# Patient Record
Sex: Male | Born: 1955 | Race: Black or African American | Hispanic: No | Marital: Single | Smoking: Former smoker
Health system: Southern US, Community
[De-identification: ages and names within clinical notes are randomized; demographics above are authoritative.]

## PROBLEM LIST (undated history)

## (undated) DIAGNOSIS — E119 Type 2 diabetes mellitus without complications: Secondary | ICD-10-CM

## (undated) DIAGNOSIS — N179 Acute kidney failure, unspecified: Secondary | ICD-10-CM

## (undated) DIAGNOSIS — E7800 Pure hypercholesterolemia, unspecified: Secondary | ICD-10-CM

## (undated) DIAGNOSIS — E1111 Type 2 diabetes mellitus with ketoacidosis with coma: Secondary | ICD-10-CM

## (undated) DIAGNOSIS — G9340 Encephalopathy, unspecified: Secondary | ICD-10-CM

## (undated) DIAGNOSIS — I10 Essential (primary) hypertension: Secondary | ICD-10-CM

## (undated) DIAGNOSIS — Z8711 Personal history of peptic ulcer disease: Secondary | ICD-10-CM

## (undated) DIAGNOSIS — U071 COVID-19: Secondary | ICD-10-CM

## (undated) HISTORY — PX: TONSILLECTOMY: SUR1361

## (undated) HISTORY — PX: APPENDECTOMY: SHX54

## (undated) HISTORY — PX: OTHER SURGICAL HISTORY: SHX169

---

## 2013-08-26 ENCOUNTER — Encounter (HOSPITAL_COMMUNITY): Payer: Self-pay | Admitting: Emergency Medicine

## 2013-08-26 ENCOUNTER — Emergency Department (HOSPITAL_COMMUNITY): Payer: Non-veteran care

## 2013-08-26 ENCOUNTER — Emergency Department (HOSPITAL_COMMUNITY)
Admission: EM | Admit: 2013-08-26 | Discharge: 2013-08-26 | Disposition: A | Discharge status: Home / Self Care | Payer: Non-veteran care | Attending: Emergency Medicine | Admitting: Emergency Medicine

## 2013-08-26 DIAGNOSIS — I10 Essential (primary) hypertension: Secondary | ICD-10-CM | POA: Insufficient documentation

## 2013-08-26 DIAGNOSIS — E119 Type 2 diabetes mellitus without complications: Secondary | ICD-10-CM | POA: Insufficient documentation

## 2013-08-26 DIAGNOSIS — R0789 Other chest pain: Secondary | ICD-10-CM | POA: Insufficient documentation

## 2013-08-26 HISTORY — DX: Essential (primary) hypertension: I10

## 2013-08-26 HISTORY — DX: Type 2 diabetes mellitus without complications: E11.9

## 2013-08-26 LAB — CBC WITH DIFFERENTIAL/PLATELET
BASOS PCT: 0 % (ref 0–1)
Basophils Absolute: 0 10*3/uL (ref 0.0–0.1)
EOS ABS: 0.1 10*3/uL (ref 0.0–0.7)
EOS PCT: 1 % (ref 0–5)
HCT: 38.7 % — ABNORMAL LOW (ref 39.0–52.0)
Hemoglobin: 12.8 g/dL — ABNORMAL LOW (ref 13.0–17.0)
LYMPHS ABS: 1.6 10*3/uL (ref 0.7–4.0)
Lymphocytes Relative: 30 % (ref 12–46)
MCH: 26.6 pg (ref 26.0–34.0)
MCHC: 33.1 g/dL (ref 30.0–36.0)
MCV: 80.5 fL (ref 78.0–100.0)
Monocytes Absolute: 0.2 10*3/uL (ref 0.1–1.0)
Monocytes Relative: 5 % (ref 3–12)
NEUTROS PCT: 64 % (ref 43–77)
Neutro Abs: 3.3 10*3/uL (ref 1.7–7.7)
PLATELETS: 206 10*3/uL (ref 150–400)
RBC: 4.81 MIL/uL (ref 4.22–5.81)
RDW: 14.3 % (ref 11.5–15.5)
WBC: 5.2 10*3/uL (ref 4.0–10.5)

## 2013-08-26 LAB — BASIC METABOLIC PANEL
BUN: 13 mg/dL (ref 6–23)
CO2: 20 mEq/L (ref 19–32)
Calcium: 9.4 mg/dL (ref 8.4–10.5)
Chloride: 102 mEq/L (ref 96–112)
Creatinine, Ser: 1.06 mg/dL (ref 0.50–1.35)
GFR, EST AFRICAN AMERICAN: 88 mL/min — AB (ref >90)
GFR, EST NON AFRICAN AMERICAN: 76 mL/min — AB (ref >90)
Glucose, Bld: 166 mg/dL — ABNORMAL HIGH (ref 70–99)
Potassium: 4.1 mEq/L (ref 3.7–5.3)
SODIUM: 136 meq/L — AB (ref 137–147)

## 2013-08-26 LAB — I-STAT TROPONIN, ED: TROPONIN I, POC: 0 ng/mL (ref 0.00–0.08)

## 2013-08-26 LAB — TROPONIN I: Troponin I: <0.3 ng/mL (ref <0.30)

## 2013-08-26 IMAGING — CR DG CHEST 2V
2 series · 2 of 2 positions shown · non-contrast
Comparison: None.

CLINICAL DATA: Left chest pain

EXAM:
CHEST  2 VIEW

[w chest pa]
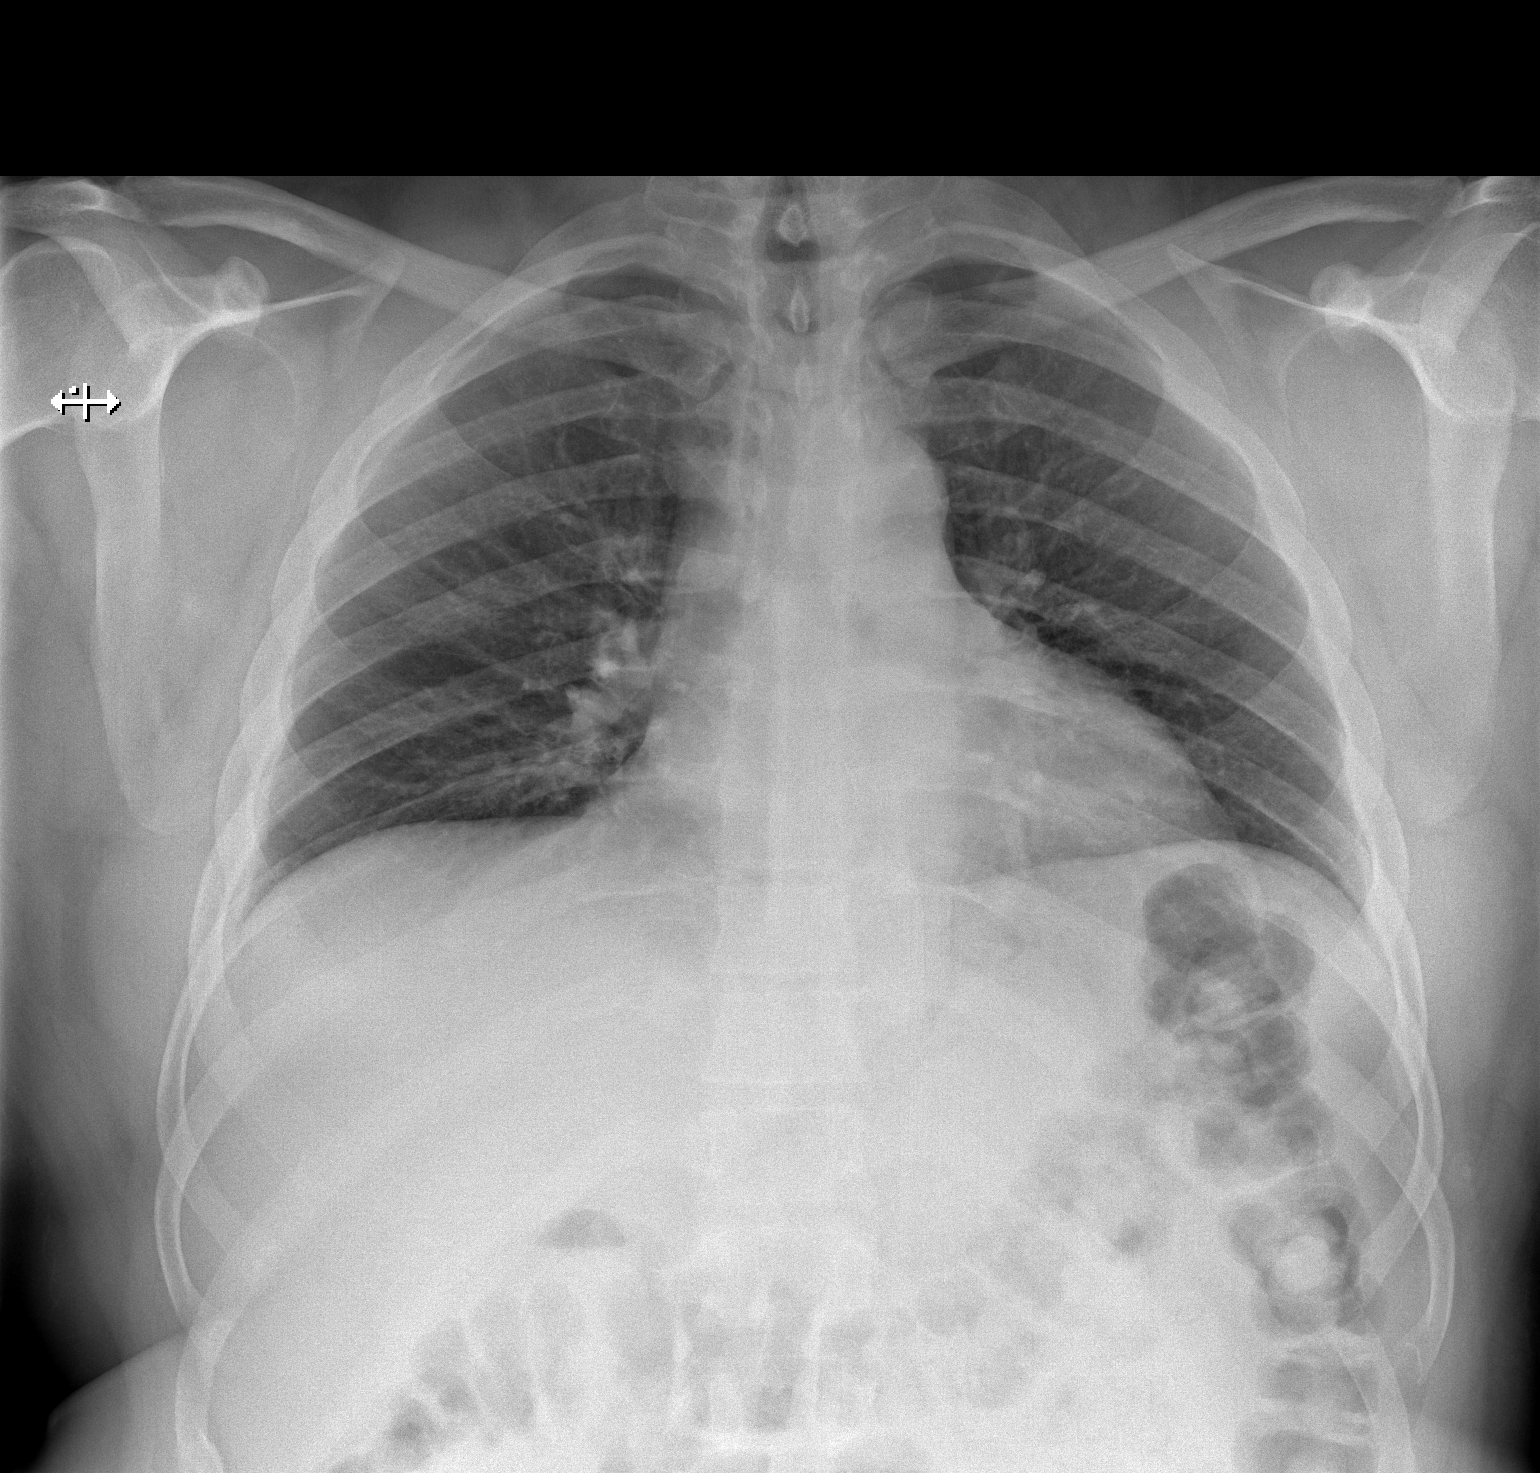

[w chest lat]
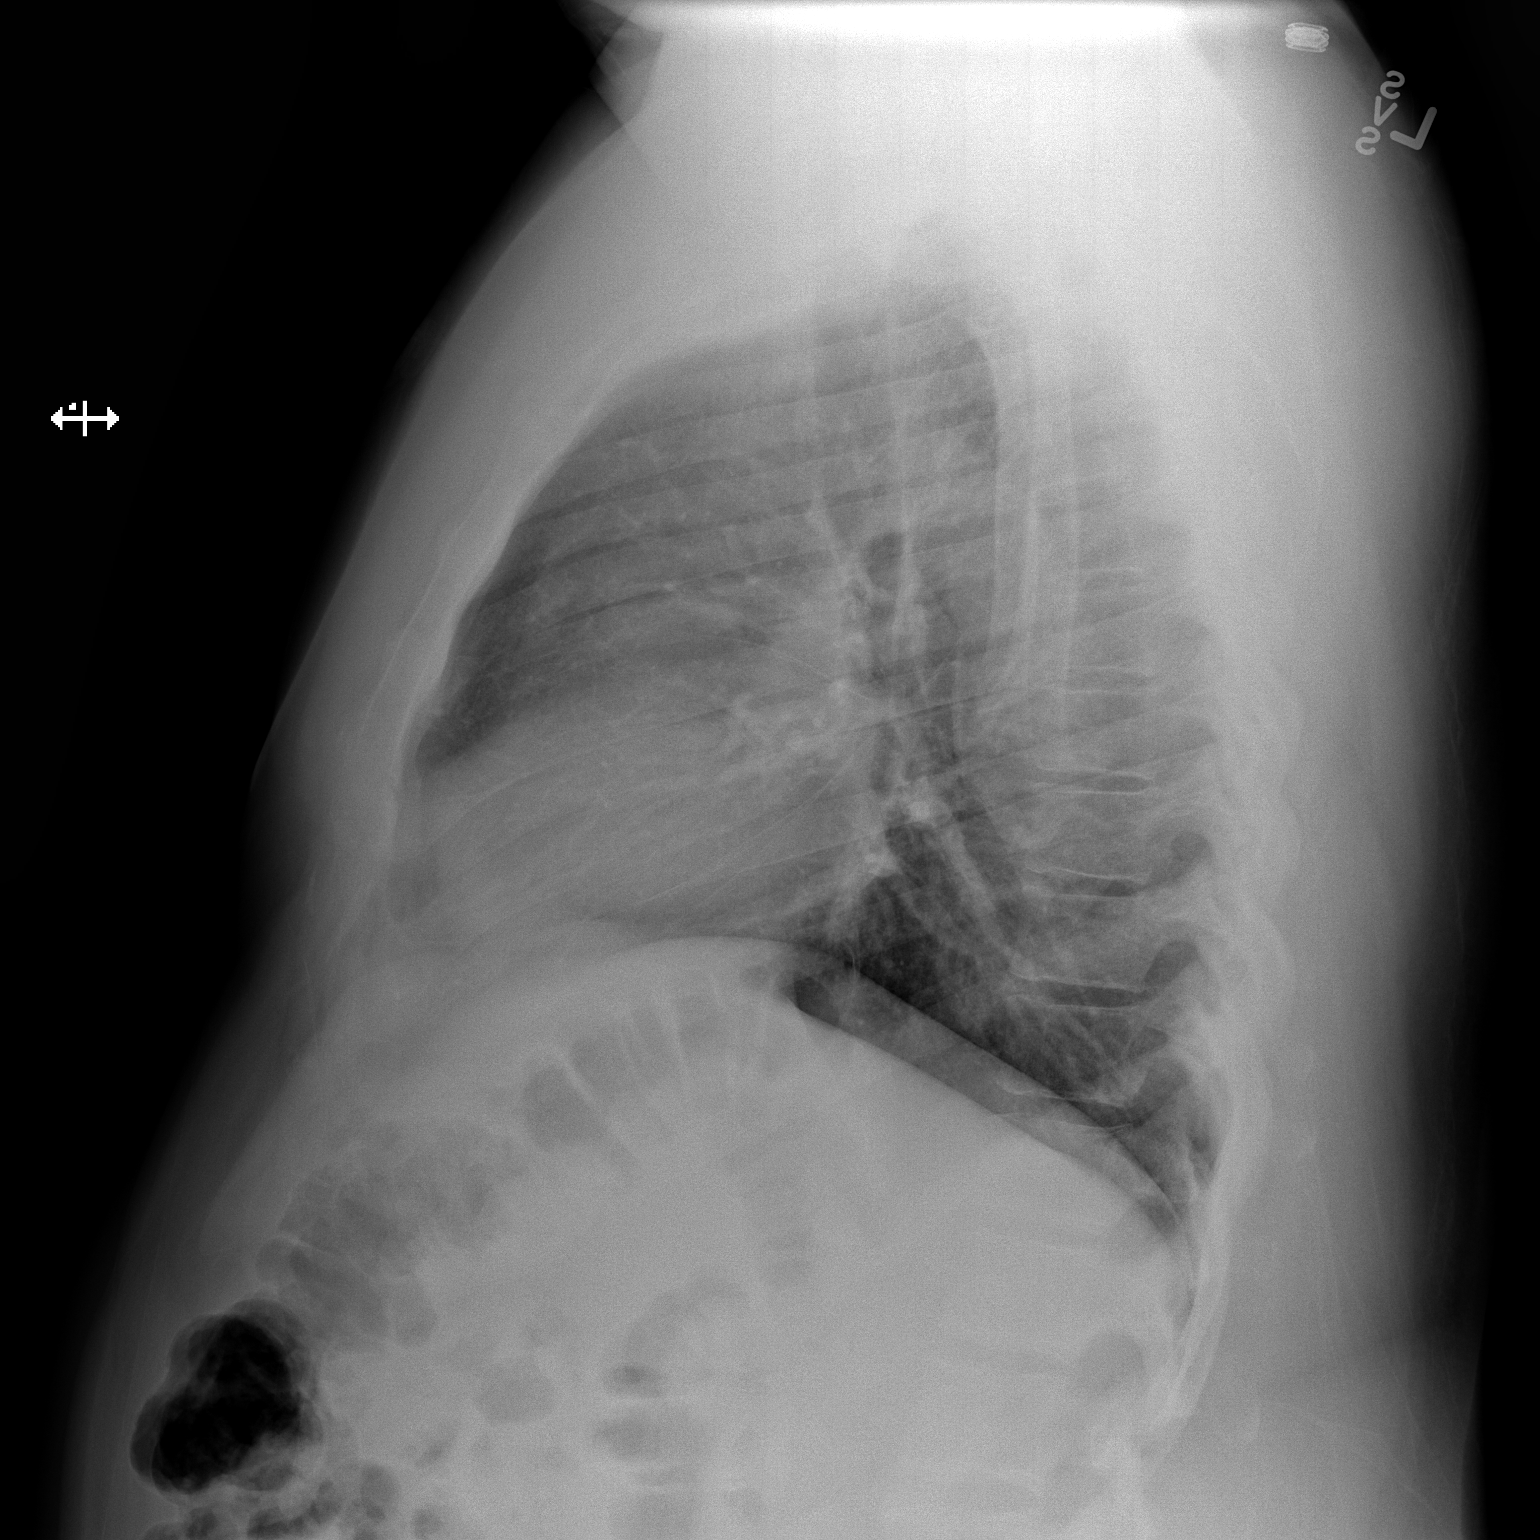

[2 of 2 positions shown; findings below may reference images not displayed]

FINDINGS: The cardiomediastinal silhouette is unremarkable.

There is no evidence of focal airspace disease, pulmonary edema,
suspicious pulmonary nodule/mass, pleural effusion, or pneumothorax.
No acute bony abnormalities are identified.
IMPRESSION: No active cardiopulmonary disease.

## 2013-08-26 MED ORDER — OXYCODONE-ACETAMINOPHEN 5-325 MG PO TABS
1.0000 | ORAL_TABLET | Freq: Once | ORAL | Status: AC
  Administered 2013-08-26: 1 via ORAL
  Filled 2013-08-26: qty 1

## 2013-08-26 MED ORDER — OXYCODONE-ACETAMINOPHEN 5-325 MG PO TABS: 1.0000 | ORAL_TABLET | Freq: Four times a day (QID) | ORAL | Status: DC | PRN

## 2013-08-26 NOTE — ED Notes (Signed)
Pt c/o left sided chest pain; feels like muscle pulling x 3 wks; states feels dizzy when pain starts

## 2013-08-26 NOTE — ED Provider Notes (Signed)
CSN: [id number]     Arrival date & time 08/26/13  1535 History   First MD Initiated Contact with Patient 08/26/13 1953     Chief Complaint  Patient presents with  . Chest Pain     (Consider location/radiation/quality/duration/timing/severity/associated sxs/prior Treatment) Patient is a 58 y.o. male presenting with chest pain. The history is provided by the patient.  Chest Pain Pain location:  L chest Pain quality comment:  Spasm like Pain radiates to:  Does not radiate Pain radiates to the back: no   Pain severity:  Moderate Onset quality:  Gradual Duration:  3 weeks Timing:  Constant Progression:  Unchanged Chronicity:  New Context: at rest   Context: not breathing and not lifting   Relieved by: Mild relief with topical cream. Worsened by:  Nothing tried Ineffective treatments:  None tried Associated symptoms: no abdominal pain, no cough, no fever, no headache, no nausea, no numbness, no shortness of breath and not vomiting     Past Medical History  Diagnosis Date  . Hypertension   . Diabetes mellitus without complication    Past Surgical History  Procedure Laterality Date  . Appendectomy     No family history on file. History  Substance Use Topics  . Smoking status: Never Smoker   . Smokeless tobacco: Not on file  . Alcohol Use: No    Review of Systems  Constitutional: Negative for fever.  HENT: Negative for drooling and rhinorrhea.   Eyes: Negative for pain.  Respiratory: Negative for cough and shortness of breath.   Cardiovascular: Positive for chest pain. Negative for leg swelling.  Gastrointestinal: Negative for nausea, vomiting, abdominal pain and diarrhea.  Genitourinary: Negative for dysuria and hematuria.  Musculoskeletal: Negative for gait problem and neck pain.  Skin: Negative for color change.  Neurological: Negative for numbness and headaches.  Hematological: Negative for adenopathy.  Psychiatric/Behavioral: Negative for behavioral problems.   All other systems reviewed and are negative.      Allergies  Review of patient's allergies indicates no known allergies.  Home Medications   Current Outpatient Rx  Name  Route  Sig  Dispense  Refill  . magnesium chloride (SLOW-MAG) 64 MG TBEC SR tablet   Oral   Take 1 tablet by mouth daily.          BP 153/88  Pulse 87  Temp(Src) 98.6 F (37 C) (Oral)  Resp 17  SpO2 97% Physical Exam  Nursing note and vitals reviewed. Constitutional: He is oriented to person, place, and time. He appears well-developed and well-nourished.  HENT:  Head: Normocephalic and atraumatic.  Right Ear: External ear normal.  Left Ear: External ear normal.  Nose: Nose normal.  Mouth/Throat: Oropharynx is clear and moist. No oropharyngeal exudate.  Eyes: Conjunctivae and EOM are normal. Pupils are equal, round, and reactive to light.  Neck: Normal range of motion. Neck supple.  Cardiovascular: Normal rate, regular rhythm, normal heart sounds and intact distal pulses.  Exam reveals no gallop and no friction rub.   No murmur heard. Pulmonary/Chest: Effort normal and breath sounds normal. No respiratory distress. He has no wheezes. He exhibits tenderness (Mild tenderness to palpation of the left lateral breast.).  Abdominal: Soft. Bowel sounds are normal. He exhibits no distension. There is no tenderness. There is no rebound and no guarding.  Musculoskeletal: Normal range of motion. He exhibits no edema and no tenderness.  Neurological: He is alert and oriented to person, place, and time.  Skin: Skin is warm and dry.  Psychiatric: He has a normal mood and affect. His behavior is normal.    ED Course  Procedures (including critical care time) Labs Review Labs Reviewed  CBC WITH DIFFERENTIAL - Abnormal; Notable for the following:    Hemoglobin 12.8 (*)    HCT 38.7 (*)    All other components within normal limits  BASIC METABOLIC PANEL - Abnormal; Notable for the following:    Sodium 136 (*)     Glucose, Bld 166 (*)    GFR calc non Af Amer 76 (*)    GFR calc Af Amer 88 (*)    All other components within normal limits  I-STAT TROPOININ, ED   Imaging Review Dg Chest 2 View  08/26/2013   CLINICAL DATA:  Left chest pain  EXAM: CHEST  2 VIEW  COMPARISON:  None.  FINDINGS: The cardiomediastinal silhouette is unremarkable.  There is no evidence of focal airspace disease, pulmonary edema, suspicious pulmonary nodule/mass, pleural effusion, or pneumothorax. No acute bony abnormalities are identified.  IMPRESSION: No active cardiopulmonary disease.   Electronically Signed   By: Hassan Rowan M.D.   On: 08/26/2013 20:54    EKG Interpretation    Date/Time:  Wednesday August 26 2013 15:55:57 EST Ventricular Rate:  87 PR Interval:  192 QRS Duration: 69 QT Interval:  345 QTC Calculation: 415 R Axis:   51 Text Interpretation:  Sinus rhythm Confirmed by Juleah Paradise  MD, Valerie Fredin ([id number]) on 08/26/2013 8:33:22 PM            MDM   Final diagnoses:  Atypical chest pain    8:33 PM 58 y.o. male with a history of hypertension and diet-controlled diabetes who presents with constant left-sided chest pain for the last 3 weeks. Patient denies any shortness of breath, diaphoresis, vomiting, fever, or other associated symptoms. He rates his pain as 7/10. He is applying topical cream but has not taken any pain medications. His pain is reproducible with palpation on exam. He is afebrile and vital signs are unremarkable here. He was a previous smoker, no family history of heart disease. His pain is not exertional in nature. Atypical for cardiac cause. Doubt PE given lack of shortness of breath and nonpleuritic chest pain. Will give a Percocet for pain and get screening labwork.  9:57 PM: Delta trop neg. Low risk for MACE per HEART score. Atypical for cardiac, possibly MSK in nature given reproducible pain.  I have discussed the diagnosis/risks/treatment options with the patient and believe the pt to be  eligible for discharge home to follow-up with pcp in 1-2 days to discuss further workup such as stress test. We also discussed returning to the ED immediately if new or worsening sx occur. We discussed the sx which are most concerning (e.g.,worsening cp, sob, fever) that necessitate immediate return. Medications administered to the patient during their visit and any new prescriptions provided to the patient are listed below.  Medications given during this visit Medications  oxyCODONE-acetaminophen (PERCOCET/ROXICET) 5-325 MG per tablet 1 tablet (1 tablet Oral Given 08/26/13 2044)    Discharge Medication List as of 08/26/2013  9:59 PM    START taking these medications   Details  oxyCODONE-acetaminophen (PERCOCET) 5-325 MG per tablet Take 1 tablet by mouth every 6 (six) hours as needed., Starting 08/26/2013, Until Discontinued, Print         Blanchard Kelch, MD 08/27/13 [phone]

## 2013-08-26 NOTE — Discharge Instructions (Signed)
Chest Pain (Nonspecific) °It is often hard to give a specific diagnosis for the cause of chest pain. There is always a chance that your pain could be related to something serious, such as a heart attack or a blood clot in the lungs. You need to follow up with your caregiver for further evaluation. °CAUSES  °· Heartburn. °· Pneumonia or bronchitis. °· Anxiety or stress. °· Inflammation around your heart (pericarditis) or lung (pleuritis or pleurisy). °· A blood clot in the lung. °· A collapsed lung (pneumothorax). It can develop suddenly on its own (spontaneous pneumothorax) or from injury (trauma) to the chest. °· Shingles infection (herpes zoster virus). °The chest wall is composed of bones, muscles, and cartilage. Any of these can be the source of the pain. °· The bones can be bruised by injury. °· The muscles or cartilage can be strained by coughing or overwork. °· The cartilage can be affected by inflammation and become sore (costochondritis). °DIAGNOSIS  °Lab tests or other studies, such as X-rays, electrocardiography, stress testing, or cardiac imaging, may be needed to find the cause of your pain.  °TREATMENT  °· Treatment depends on what may be causing your chest pain. Treatment may include: °· Acid blockers for heartburn. °· Anti-inflammatory medicine. °· Pain medicine for inflammatory conditions. °· Antibiotics if an infection is present. °· You may be advised to change lifestyle habits. This includes stopping smoking and avoiding alcohol, caffeine, and chocolate. °· You may be advised to keep your head raised (elevated) when sleeping. This reduces the chance of acid going backward from your stomach into your esophagus. °· Most of the time, nonspecific chest pain will improve within 2 to 3 days with rest and mild pain medicine. °HOME CARE INSTRUCTIONS  °· If antibiotics were prescribed, take your antibiotics as directed. Finish them even if you start to feel better. °· For the next few days, avoid physical  activities that bring on chest pain. Continue physical activities as directed. °· Do not smoke. °· Avoid drinking alcohol. °· Only take over-the-counter or prescription medicine for pain, discomfort, or fever as directed by your caregiver. °· Follow your caregiver's suggestions for further testing if your chest pain does not go away. °· Keep any follow-up appointments you made. If you do not go to an appointment, you could develop lasting (chronic) problems with pain. If there is any problem keeping an appointment, you must call to reschedule. °SEEK MEDICAL CARE IF:  °· You think you are having problems from the medicine you are taking. Read your medicine instructions carefully. °· Your chest pain does not go away, even after treatment. °· You develop a rash with blisters on your chest. °SEEK IMMEDIATE MEDICAL CARE IF:  °· You have increased chest pain or pain that spreads to your arm, neck, jaw, back, or abdomen. °· You develop shortness of breath, an increasing cough, or you are coughing up blood. °· You have severe back or abdominal pain, feel nauseous, or vomit. °· You develop severe weakness, fainting, or chills. °· You have a fever. °THIS IS AN EMERGENCY. Do not wait to see if the pain will go away. Get medical help at once. Call your local emergency services (911 in [place] Do not drive yourself to the hospital. °MAKE SURE YOU:  °· Understand these instructions. °· Will watch your condition. °· Will get help right away if you are not doing well or get worse. °Document Released: 03/28/2005 Document Revised: 09/10/2011 Document Reviewed: 01/22/2008 °[employer]® Patient Information ©2014 [employer]   [employer]. ° °

## 2013-11-18 ENCOUNTER — Encounter (HOSPITAL_COMMUNITY): Payer: Self-pay | Admitting: Emergency Medicine

## 2013-11-18 ENCOUNTER — Emergency Department (HOSPITAL_COMMUNITY)
Admission: EM | Admit: 2013-11-18 | Discharge: 2013-11-18 | Disposition: A | Discharge status: Home / Self Care | Payer: Non-veteran care | Attending: Emergency Medicine | Admitting: Emergency Medicine

## 2013-11-18 DIAGNOSIS — E119 Type 2 diabetes mellitus without complications: Secondary | ICD-10-CM | POA: Insufficient documentation

## 2013-11-18 DIAGNOSIS — H5789 Other specified disorders of eye and adnexa: Secondary | ICD-10-CM | POA: Insufficient documentation

## 2013-11-18 DIAGNOSIS — Z79899 Other long term (current) drug therapy: Secondary | ICD-10-CM | POA: Insufficient documentation

## 2013-11-18 DIAGNOSIS — H5710 Ocular pain, unspecified eye: Secondary | ICD-10-CM | POA: Insufficient documentation

## 2013-11-18 DIAGNOSIS — H5711 Ocular pain, right eye: Secondary | ICD-10-CM

## 2013-11-18 DIAGNOSIS — I10 Essential (primary) hypertension: Secondary | ICD-10-CM | POA: Insufficient documentation

## 2013-11-18 MED ORDER — TETRACAINE HCL 0.5 % OP SOLN
1.0000 [drp] | Freq: Once | OPHTHALMIC | Status: AC
  Administered 2013-11-18: 1 [drp] via OPHTHALMIC
  Filled 2013-11-18: qty 2

## 2013-11-18 MED ORDER — FLUORESCEIN SODIUM 1 MG OP STRP
1.0000 | ORAL_STRIP | Freq: Once | OPHTHALMIC | Status: AC
  Administered 2013-11-18: 1 via OPHTHALMIC
  Filled 2013-11-18: qty 1

## 2013-11-18 MED ORDER — LEVOFLOXACIN 0.5 % OP SOLN: 1.0000 [drp] | OPHTHALMIC | Status: DC

## 2013-11-18 NOTE — ED Notes (Signed)
Pt states he was trying to put his contacts in this morning when he began having rt eye irritation.  Pt doesn't know if maybe he put a contact on top of a contact that he forgot to take out or if the contact had something on it.

## 2013-11-18 NOTE — Discharge Instructions (Signed)
Call an eye specialist for further evaluation of your eye pain and discomfort.  Do not wear your contact lenses until you are evaluated by an eye specialist and told that you can. Call for a follow up appointment with a Family or Primary Care Provider.  Return if Symptoms worsen.   2

## 2013-11-18 NOTE — ED Provider Notes (Signed)
CSN: [id number]     Arrival date & time 11/18/13  1213 History  This chart was scribed for non-physician practitioner, Lorrine Kin, PA-C, working with Orpah Greek, MD by Ladene Artist, ED Scribe. This patient was seen in room WTR7/WTR7 and the patient's care was started at 12:39 PM.   Chief Complaint  Patient presents with  . Eye Pain   HPI Comments: Micheal Gomez is a 58 y.o. male who presents to the Emergency Department complaining of R eye pain onset this morning after attempting to put his contact in. Pt reports associated discharge and redness. The patient states alternative placement contact in his eye had discomfort. Pt has a feeling of something in his eye. Pain is worse with movement and closing eyes. He denies HA. Pt has taken [facility] Powder for pain relief. Normal visual acuity is 20/50 bilaterally. Optometrist: [facility]  The history is provided by the patient. No language interpreter was used.   Past Medical History  Diagnosis Date  . Hypertension   . Diabetes mellitus without complication    Past Surgical History  Procedure Laterality Date  . Appendectomy     No family history on file. History  Substance Use Topics  . Smoking status: Never Smoker   . Smokeless tobacco: Not on file  . Alcohol Use: No    Review of Systems  Constitutional: Negative for fever and chills.  Eyes: Positive for pain, discharge, redness and itching.  Neurological: Negative for light-headedness and headaches.  All other systems reviewed and are negative.  Allergies  Review of patient's allergies indicates no known allergies.  Home Medications   Prior to Admission medications   Medication Sig Start Date End Date Taking? Authorizing Provider  magnesium chloride (SLOW-MAG) 64 MG TBEC SR tablet Take 1 tablet by mouth daily.    Historical Provider, MD  oxyCODONE-acetaminophen (PERCOCET) 5-325 MG per tablet Take 1 tablet by mouth every 6 (six) hours as needed. 08/26/13   Blanchard Kelch, MD   Triage Vitals: BP 179/81  Pulse 89  Temp(Src) 98.8 F (37.1 C) (Oral)  Resp 20  SpO2 97% Physical Exam  Nursing note and vitals reviewed. Constitutional: He is oriented to person, place, and time. He appears well-developed and well-nourished. He is cooperative.  Non-toxic appearance. He does not have a sickly appearance. He does not appear ill. No distress.  HENT:  Head: Normocephalic and atraumatic. Head is without right periorbital erythema and without left periorbital erythema.  Eyes: EOM are normal. Lids are everted and swept, no foreign bodies found. Right eye exhibits discharge. No foreign body present in the right eye. Left eye exhibits no discharge. Right conjunctiva is injected. Right conjunctiva has no hemorrhage. Left conjunctiva is not injected. Pupils are unequal.  Reports right eye discomfort with EOM. IOP: L eye: 20, 18 R eye: 17, 18. Right pupil slightly more dilated than left. Reactive to light.  Neck: Normal range of motion. Neck supple.  Pulmonary/Chest: Effort normal. No respiratory distress.  Musculoskeletal: Normal range of motion.  Neurological: He is alert and oriented to person, place, and time.  Skin: Skin is warm and dry. He is not diaphoretic.  Psychiatric: He has a normal mood and affect. His behavior is normal.   ED Course  Procedures (including critical care time)  COORDINATION OF CARE: 12:42 PM Discussed treatment plan with pt at bedside and pt agreed to plan.  Labs Review Labs Reviewed - No data to display  Imaging Review No results found.  EKG Interpretation None      MDM   Final diagnoses:  Pain in right eye   Patient with erythema and watery, mild purulent discharge from right eye. Physical exam shows pain with extraocular movement. And right pupil slightly more dilated than the left. But is reactive to both ipsilateral and contralateral light. Intraocular pressures are normal. There is no obvious foreign body, in no  fluorescein uptake. Will treat as if there is a corneal abrasion and have the patient followup with an eye specialist. Discussed these findings with Dr. Betsey Holiday who agrees to plan.  Meds given in ED:  Medications  tetracaine (PONTOCAINE) 0.5 % ophthalmic solution 1 drop (1 drop Both Eyes Given by Other 11/18/13 1250)  fluorescein ophthalmic strip 1 strip (1 strip Both Eyes Given by Other 11/18/13 1250)    Discharge Medication List as of 11/18/2013  1:43 PM    START taking these medications   Details  levofloxacin (QUIXIN) 0.5 % ophthalmic solution Place 1 drop into the right eye every 2 (two) hours. 1-2 drops in Right eye every 2 hours while awake fore 2 days, THEN 1 drop in Right eye every 6 hours for 5 days., Starting 11/18/2013, Until Discontinued, Print       I personally performed the services described in this documentation, which was scribed in my presence. The recorded information has been reviewed and is accurate.    Lorrine Kin, PA-C 11/19/13 1440

## 2013-11-20 NOTE — ED Provider Notes (Signed)
Medical screening examination/treatment/procedure(s) were performed by non-physician practitioner and as supervising physician I was immediately available for consultation/collaboration.   EKG Interpretation None       Orpah Greek, MD 11/20/13 [phone]

## 2015-08-05 ENCOUNTER — Emergency Department (HOSPITAL_COMMUNITY): Payer: Non-veteran care

## 2015-08-05 ENCOUNTER — Inpatient Hospital Stay (HOSPITAL_COMMUNITY)
Admission: EM | Admit: 2015-08-05 | Discharge: 2015-08-21 | DRG: 637 | Disposition: A | Discharge status: Home / Self Care | Payer: Non-veteran care | Attending: Family Medicine | Admitting: Family Medicine

## 2015-08-05 ENCOUNTER — Inpatient Hospital Stay (HOSPITAL_COMMUNITY): Payer: Non-veteran care

## 2015-08-05 ENCOUNTER — Encounter (HOSPITAL_COMMUNITY): Payer: Self-pay | Admitting: Emergency Medicine

## 2015-08-05 DIAGNOSIS — G9340 Encephalopathy, unspecified: Secondary | ICD-10-CM | POA: Diagnosis present

## 2015-08-05 DIAGNOSIS — G4733 Obstructive sleep apnea (adult) (pediatric): Secondary | ICD-10-CM | POA: Diagnosis present

## 2015-08-05 DIAGNOSIS — E1165 Type 2 diabetes mellitus with hyperglycemia: Secondary | ICD-10-CM | POA: Diagnosis not present

## 2015-08-05 DIAGNOSIS — E1110 Type 2 diabetes mellitus with ketoacidosis without coma: Secondary | ICD-10-CM | POA: Diagnosis present

## 2015-08-05 DIAGNOSIS — D539 Nutritional anemia, unspecified: Secondary | ICD-10-CM | POA: Diagnosis present

## 2015-08-05 DIAGNOSIS — E1101 Type 2 diabetes mellitus with hyperosmolarity with coma: Secondary | ICD-10-CM | POA: Diagnosis not present

## 2015-08-05 DIAGNOSIS — D696 Thrombocytopenia, unspecified: Secondary | ICD-10-CM | POA: Diagnosis present

## 2015-08-05 DIAGNOSIS — R739 Hyperglycemia, unspecified: Secondary | ICD-10-CM | POA: Diagnosis present

## 2015-08-05 DIAGNOSIS — R3129 Other microscopic hematuria: Secondary | ICD-10-CM | POA: Diagnosis present

## 2015-08-05 DIAGNOSIS — Z9989 Dependence on other enabling machines and devices: Secondary | ICD-10-CM

## 2015-08-05 DIAGNOSIS — N179 Acute kidney failure, unspecified: Secondary | ICD-10-CM | POA: Diagnosis present

## 2015-08-05 DIAGNOSIS — E1311 Other specified diabetes mellitus with ketoacidosis with coma: Secondary | ICD-10-CM | POA: Diagnosis present

## 2015-08-05 DIAGNOSIS — E662 Morbid (severe) obesity with alveolar hypoventilation: Secondary | ICD-10-CM | POA: Diagnosis present

## 2015-08-05 DIAGNOSIS — Z992 Dependence on renal dialysis: Secondary | ICD-10-CM | POA: Diagnosis not present

## 2015-08-05 DIAGNOSIS — N171 Acute kidney failure with acute cortical necrosis: Secondary | ICD-10-CM | POA: Diagnosis not present

## 2015-08-05 DIAGNOSIS — R571 Hypovolemic shock: Secondary | ICD-10-CM | POA: Diagnosis present

## 2015-08-05 DIAGNOSIS — E878 Other disorders of electrolyte and fluid balance, not elsewhere classified: Secondary | ICD-10-CM | POA: Diagnosis present

## 2015-08-05 DIAGNOSIS — G9341 Metabolic encephalopathy: Secondary | ICD-10-CM | POA: Diagnosis present

## 2015-08-05 DIAGNOSIS — E873 Alkalosis: Secondary | ICD-10-CM | POA: Diagnosis present

## 2015-08-05 DIAGNOSIS — Z4659 Encounter for fitting and adjustment of other gastrointestinal appliance and device: Secondary | ICD-10-CM

## 2015-08-05 DIAGNOSIS — N170 Acute kidney failure with tubular necrosis: Secondary | ICD-10-CM | POA: Diagnosis present

## 2015-08-05 DIAGNOSIS — E0810 Diabetes mellitus due to underlying condition with ketoacidosis without coma: Secondary | ICD-10-CM

## 2015-08-05 DIAGNOSIS — I120 Hypertensive chronic kidney disease with stage 5 chronic kidney disease or end stage renal disease: Secondary | ICD-10-CM | POA: Diagnosis present

## 2015-08-05 DIAGNOSIS — R4182 Altered mental status, unspecified: Secondary | ICD-10-CM | POA: Diagnosis present

## 2015-08-05 DIAGNOSIS — R1310 Dysphagia, unspecified: Secondary | ICD-10-CM | POA: Diagnosis present

## 2015-08-05 DIAGNOSIS — N186 End stage renal disease: Secondary | ICD-10-CM | POA: Diagnosis present

## 2015-08-05 DIAGNOSIS — E1310 Other specified diabetes mellitus with ketoacidosis without coma: Secondary | ICD-10-CM

## 2015-08-05 DIAGNOSIS — A419 Sepsis, unspecified organism: Secondary | ICD-10-CM

## 2015-08-05 DIAGNOSIS — E1122 Type 2 diabetes mellitus with diabetic chronic kidney disease: Secondary | ICD-10-CM | POA: Diagnosis present

## 2015-08-05 DIAGNOSIS — Z452 Encounter for adjustment and management of vascular access device: Secondary | ICD-10-CM

## 2015-08-05 DIAGNOSIS — M6282 Rhabdomyolysis: Secondary | ICD-10-CM | POA: Diagnosis present

## 2015-08-05 DIAGNOSIS — Z6837 Body mass index (BMI) 37.0-37.9, adult: Secondary | ICD-10-CM | POA: Diagnosis not present

## 2015-08-05 DIAGNOSIS — R740 Nonspecific elevation of levels of transaminase and lactic acid dehydrogenase [LDH]: Secondary | ICD-10-CM | POA: Diagnosis present

## 2015-08-05 DIAGNOSIS — E876 Hypokalemia: Secondary | ICD-10-CM | POA: Diagnosis not present

## 2015-08-05 DIAGNOSIS — E1111 Type 2 diabetes mellitus with ketoacidosis with coma: Secondary | ICD-10-CM | POA: Diagnosis present

## 2015-08-05 DIAGNOSIS — E861 Hypovolemia: Secondary | ICD-10-CM | POA: Diagnosis present

## 2015-08-05 DIAGNOSIS — E785 Hyperlipidemia, unspecified: Secondary | ICD-10-CM | POA: Diagnosis present

## 2015-08-05 DIAGNOSIS — E870 Hyperosmolality and hypernatremia: Secondary | ICD-10-CM | POA: Diagnosis not present

## 2015-08-05 DIAGNOSIS — R7401 Elevation of levels of liver transaminase levels: Secondary | ICD-10-CM

## 2015-08-05 HISTORY — DX: Type 2 diabetes mellitus with ketoacidosis without coma: E11.10

## 2015-08-05 LAB — BASIC METABOLIC PANEL
ANION GAP: 27 — AB (ref 5–15)
ANION GAP: 16 — AB (ref 5–15)
BUN: 99 mg/dL — AB (ref 6–20)
BUN: 102 mg/dL — ABNORMAL HIGH (ref 6–20)
CHLORIDE: 128 mmol/L — AB (ref 101–111)
CO2: 11 mmol/L — AB (ref 22–32)
CO2: 19 mmol/L — AB (ref 22–32)
CREATININE: 5.51 mg/dL — AB (ref 0.61–1.24)
Calcium: 7.9 mg/dL — ABNORMAL LOW (ref 8.9–10.3)
Calcium: 7.8 mg/dL — ABNORMAL LOW (ref 8.9–10.3)
Chloride: 118 mmol/L — ABNORMAL HIGH (ref 101–111)
Creatinine, Ser: 5.39 mg/dL — ABNORMAL HIGH (ref 0.61–1.24)
GFR calc Af Amer: 12 mL/min — ABNORMAL LOW (ref >60)
GFR calc non Af Amer: 10 mL/min — ABNORMAL LOW (ref >60)
GFR, EST AFRICAN AMERICAN: 12 mL/min — AB (ref >60)
GFR, EST NON AFRICAN AMERICAN: 10 mL/min — AB (ref >60)
GLUCOSE: 1022 mg/dL — AB (ref 65–99)
Glucose, Bld: 496 mg/dL — ABNORMAL HIGH (ref 65–99)
POTASSIUM: 3.1 mmol/L — AB (ref 3.5–5.1)
Potassium: 3.1 mmol/L — ABNORMAL LOW (ref 3.5–5.1)
SODIUM: 163 mmol/L — AB (ref 135–145)
Sodium: 156 mmol/L — ABNORMAL HIGH (ref 135–145)

## 2015-08-05 LAB — BLOOD GAS, ARTERIAL
ACID-BASE DEFICIT: 19.8 mmol/L — AB (ref 0.0–2.0)
Acid-base deficit: 13.8 mmol/L — ABNORMAL HIGH (ref 0.0–2.0)
Bicarbonate: 8.7 mEq/L — ABNORMAL LOW (ref 20.0–24.0)
Bicarbonate: 11.9 mEq/L — ABNORMAL LOW (ref 20.0–24.0)
DRAWN BY: 276051
DRAWN BY: 295031
FIO2: 0.21
O2 Content: 2 L/min
O2 Saturation: 91.5 %
O2 Saturation: 93.9 %
PATIENT TEMPERATURE: 94.8
PCO2 ART: 25.8 mmHg — AB (ref 35.0–45.0)
PCO2 ART: 28.1 mmHg — AB (ref 35.0–45.0)
PH ART: 7.138 — AB (ref 7.350–7.450)
PH ART: 7.251 — AB (ref 7.350–7.450)
Patient temperature: 98.6
TCO2: 8.5 mmol/L (ref 0–100)
TCO2: 11 mmol/L (ref 0–100)
pO2, Arterial: 76.4 mmHg — ABNORMAL LOW (ref 80.0–100.0)
pO2, Arterial: 83.2 mmHg (ref 80.0–100.0)

## 2015-08-05 LAB — URINE MICROSCOPIC-ADD ON

## 2015-08-05 LAB — CBC
HEMATOCRIT: 42 % (ref 39.0–52.0)
Hemoglobin: 13.9 g/dL (ref 13.0–17.0)
MCH: 27.2 pg (ref 26.0–34.0)
MCHC: 33.1 g/dL (ref 30.0–36.0)
MCV: 82.2 fL (ref 78.0–100.0)
PLATELETS: 160 10*3/uL (ref 150–400)
RBC: 5.11 MIL/uL (ref 4.22–5.81)
RDW: 15.3 % (ref 11.5–15.5)
WBC: 12.4 10*3/uL — ABNORMAL HIGH (ref 4.0–10.5)

## 2015-08-05 LAB — CBC WITH DIFFERENTIAL/PLATELET
Basophils Absolute: 0 10*3/uL (ref 0.0–0.1)
Basophils Relative: 0 %
EOS ABS: 0 10*3/uL (ref 0.0–0.7)
EOS PCT: 0 %
HCT: 48.4 % (ref 39.0–52.0)
HEMOGLOBIN: 16.2 g/dL (ref 13.0–17.0)
Lymphocytes Relative: 7 %
Lymphs Abs: 1.2 10*3/uL (ref 0.7–4.0)
MCH: 27 pg (ref 26.0–34.0)
MCHC: 33.5 g/dL (ref 30.0–36.0)
MCV: 80.8 fL (ref 78.0–100.0)
Monocytes Absolute: 1.2 10*3/uL — ABNORMAL HIGH (ref 0.1–1.0)
Monocytes Relative: 7 %
NEUTROS PCT: 86 %
Neutro Abs: 14.9 10*3/uL — ABNORMAL HIGH (ref 1.7–7.7)
PLATELETS: 206 10*3/uL (ref 150–400)
RBC: 5.99 MIL/uL — AB (ref 4.22–5.81)
WBC: 17.3 10*3/uL — ABNORMAL HIGH (ref 4.0–10.5)

## 2015-08-05 LAB — OSMOLALITY: OSMOLALITY: 457 mosm/kg — AB (ref 275–295)

## 2015-08-05 LAB — TROPONIN I
TROPONIN I: 0.07 ng/mL — AB (ref <0.031)
Troponin I: 0.12 ng/mL — ABNORMAL HIGH (ref <0.031)

## 2015-08-05 LAB — URINALYSIS, ROUTINE W REFLEX MICROSCOPIC
Glucose, UA: >1000 mg/dL — AB
Ketones, ur: 15 mg/dL — AB
Leukocytes, UA: NEGATIVE
NITRITE: NEGATIVE
Protein, ur: 100 mg/dL — AB
SPECIFIC GRAVITY, URINE: 1.028 (ref 1.005–1.030)
pH: 5 (ref 5.0–8.0)

## 2015-08-05 LAB — I-STAT CG4 LACTIC ACID, ED
LACTIC ACID, VENOUS: 3.93 mmol/L — AB (ref 0.5–2.0)
Lactic Acid, Venous: 2.28 mmol/L (ref 0.5–2.0)

## 2015-08-05 LAB — COMPREHENSIVE METABOLIC PANEL
ALK PHOS: 132 U/L — AB (ref 38–126)
ALT: 42 U/L (ref 17–63)
AST: 55 U/L — ABNORMAL HIGH (ref 15–41)
Albumin: 3.9 g/dL (ref 3.5–5.0)
Anion gap: 33 — ABNORMAL HIGH (ref 5–15)
BUN: 103 mg/dL — ABNORMAL HIGH (ref 6–20)
CALCIUM: 8.7 mg/dL — AB (ref 8.9–10.3)
CO2: 12 mmol/L — ABNORMAL LOW (ref 22–32)
Chloride: 99 mmol/L — ABNORMAL LOW (ref 101–111)
Creatinine, Ser: 6.48 mg/dL — ABNORMAL HIGH (ref 0.61–1.24)
GFR calc non Af Amer: 8 mL/min — ABNORMAL LOW (ref >60)
GFR, EST AFRICAN AMERICAN: 10 mL/min — AB (ref >60)
Glucose, Bld: 1548 mg/dL (ref 65–99)
Potassium: 4.9 mmol/L (ref 3.5–5.1)
Sodium: 144 mmol/L (ref 135–145)
Total Bilirubin: 1.6 mg/dL — ABNORMAL HIGH (ref 0.3–1.2)
Total Protein: 8.2 g/dL — ABNORMAL HIGH (ref 6.5–8.1)

## 2015-08-05 LAB — RAPID URINE DRUG SCREEN, HOSP PERFORMED
Amphetamines: NOT DETECTED
BARBITURATES: NOT DETECTED
Benzodiazepines: NOT DETECTED
COCAINE: NOT DETECTED
OPIATES: NOT DETECTED
TETRAHYDROCANNABINOL: NOT DETECTED

## 2015-08-05 LAB — GLUCOSE, CAPILLARY
GLUCOSE-CAPILLARY: 552 mg/dL — AB (ref 65–99)
GLUCOSE-CAPILLARY: 543 mg/dL — AB (ref 65–99)
Glucose-Capillary: >600 mg/dL (ref 65–99)
Glucose-Capillary: 559 mg/dL (ref 65–99)
Glucose-Capillary: 393 mg/dL — ABNORMAL HIGH (ref 65–99)

## 2015-08-05 LAB — CBG MONITORING, ED: Glucose-Capillary: >600 mg/dL (ref 65–99)

## 2015-08-05 LAB — BLOOD GAS, VENOUS
Acid-Base Excess: 15.2 mmol/L — ABNORMAL HIGH (ref 0.0–2.0)
BICARBONATE: 13.1 meq/L — AB (ref 20.0–24.0)
O2 SAT: 38.1 %
PATIENT TEMPERATURE: 98.6
TCO2: 12.7 mmol/L (ref 0–100)
pCO2, Ven: 48 mmHg (ref 45.0–50.0)
pH, Ven: 7.064 — CL (ref 7.250–7.300)

## 2015-08-05 LAB — PHOSPHORUS: Phosphorus: 4.8 mg/dL — ABNORMAL HIGH (ref 2.5–4.6)

## 2015-08-05 LAB — BETA-HYDROXYBUTYRIC ACID: Beta-Hydroxybutyric Acid: >8 mmol/L — ABNORMAL HIGH (ref 0.05–0.27)

## 2015-08-05 LAB — I-STAT CHEM 8, ED
BUN: 107 mg/dL — AB (ref 6–20)
CALCIUM ION: 1.07 mmol/L — AB (ref 1.13–1.30)
CHLORIDE: 106 mmol/L (ref 101–111)
Creatinine, Ser: 5.6 mg/dL — ABNORMAL HIGH (ref 0.61–1.24)
HEMATOCRIT: 53 % — AB (ref 39.0–52.0)
Hemoglobin: 18 g/dL — ABNORMAL HIGH (ref 13.0–17.0)
Potassium: 4.8 mmol/L (ref 3.5–5.1)
Sodium: 144 mmol/L (ref 135–145)
TCO2: 15 mmol/L (ref 0–100)

## 2015-08-05 LAB — MAGNESIUM: Magnesium: 3.6 mg/dL — ABNORMAL HIGH (ref 1.7–2.4)

## 2015-08-05 LAB — LIPASE, BLOOD: LIPASE: 263 U/L — AB (ref 11–51)

## 2015-08-05 LAB — PROCALCITONIN: PROCALCITONIN: 2.56 ng/mL

## 2015-08-05 LAB — CK: CK TOTAL: 3356 U/L — AB (ref 49–397)

## 2015-08-05 LAB — MRSA PCR SCREENING: MRSA BY PCR: NEGATIVE

## 2015-08-05 IMAGING — DX DG CHEST 1V
2 series · 2 of 2 positions shown · non-contrast
Comparison: [DATE]

CLINICAL DATA: Altered mental status

EXAM:
CHEST 1 VIEW

[chest ap (1 of 2)]
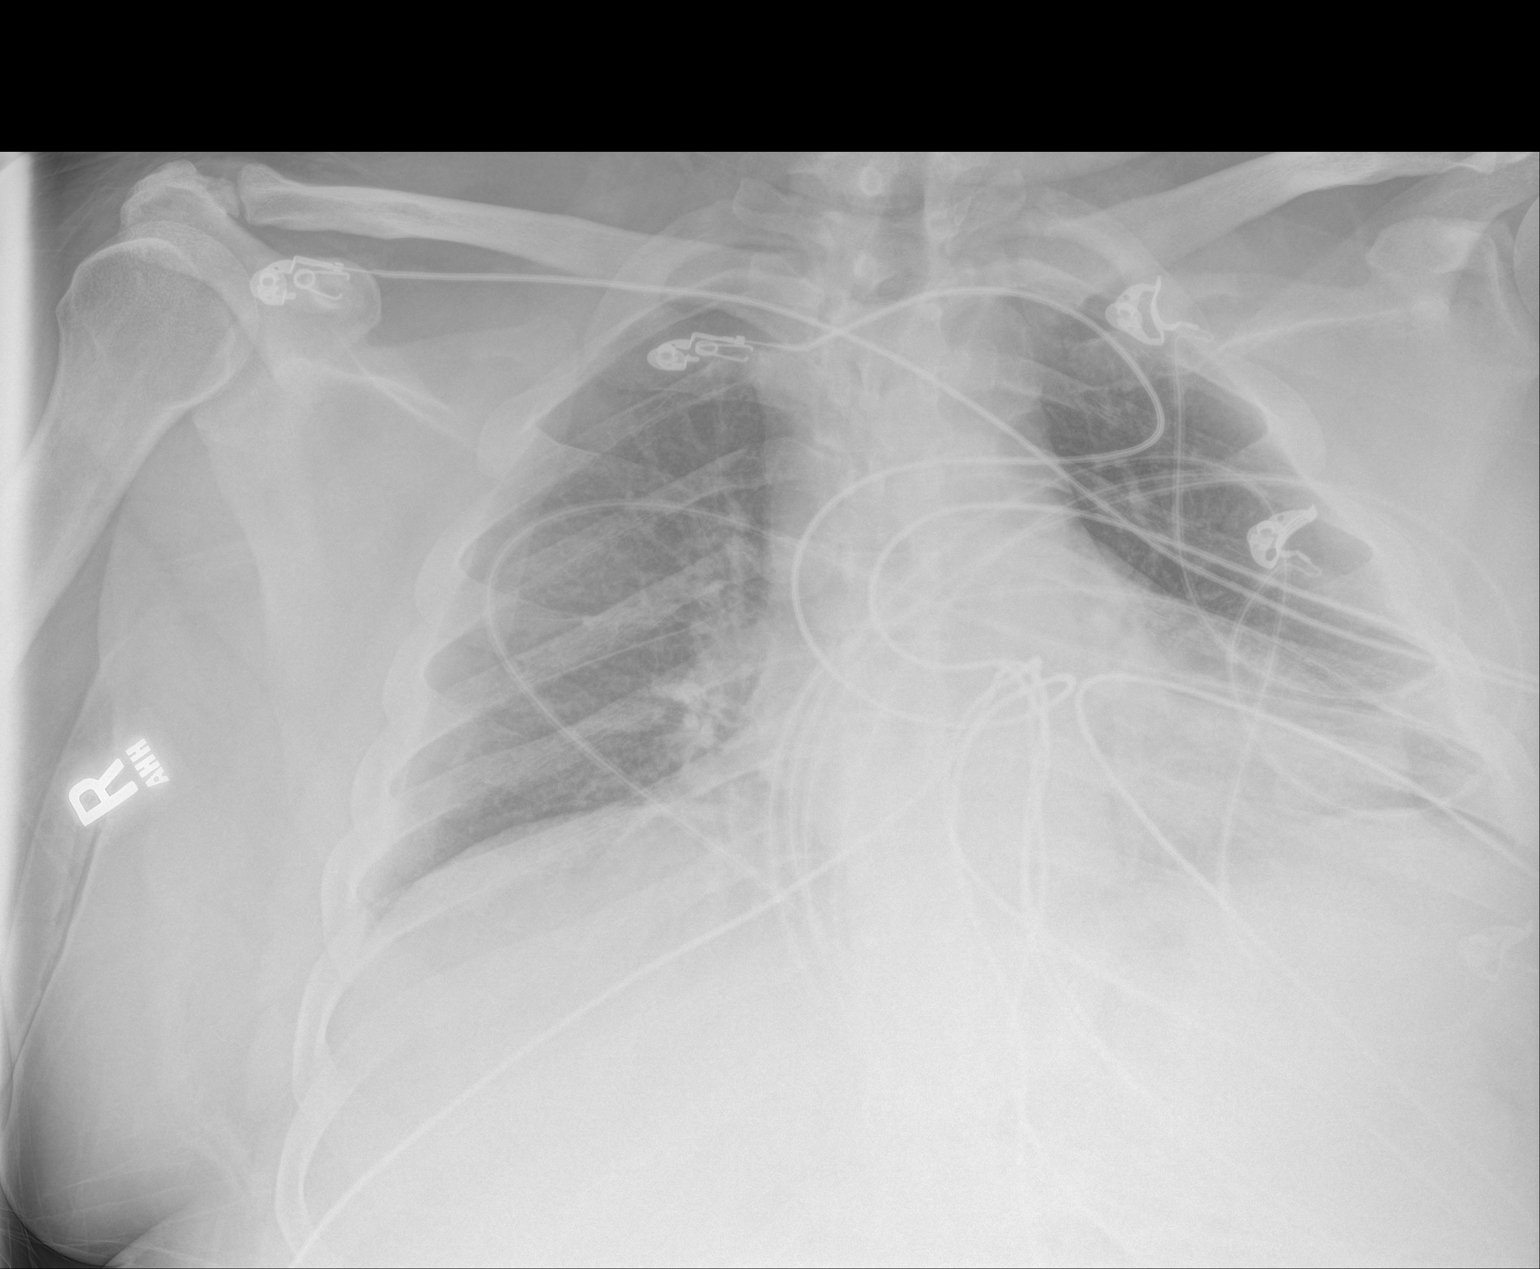

[chest ap (2 of 2)]
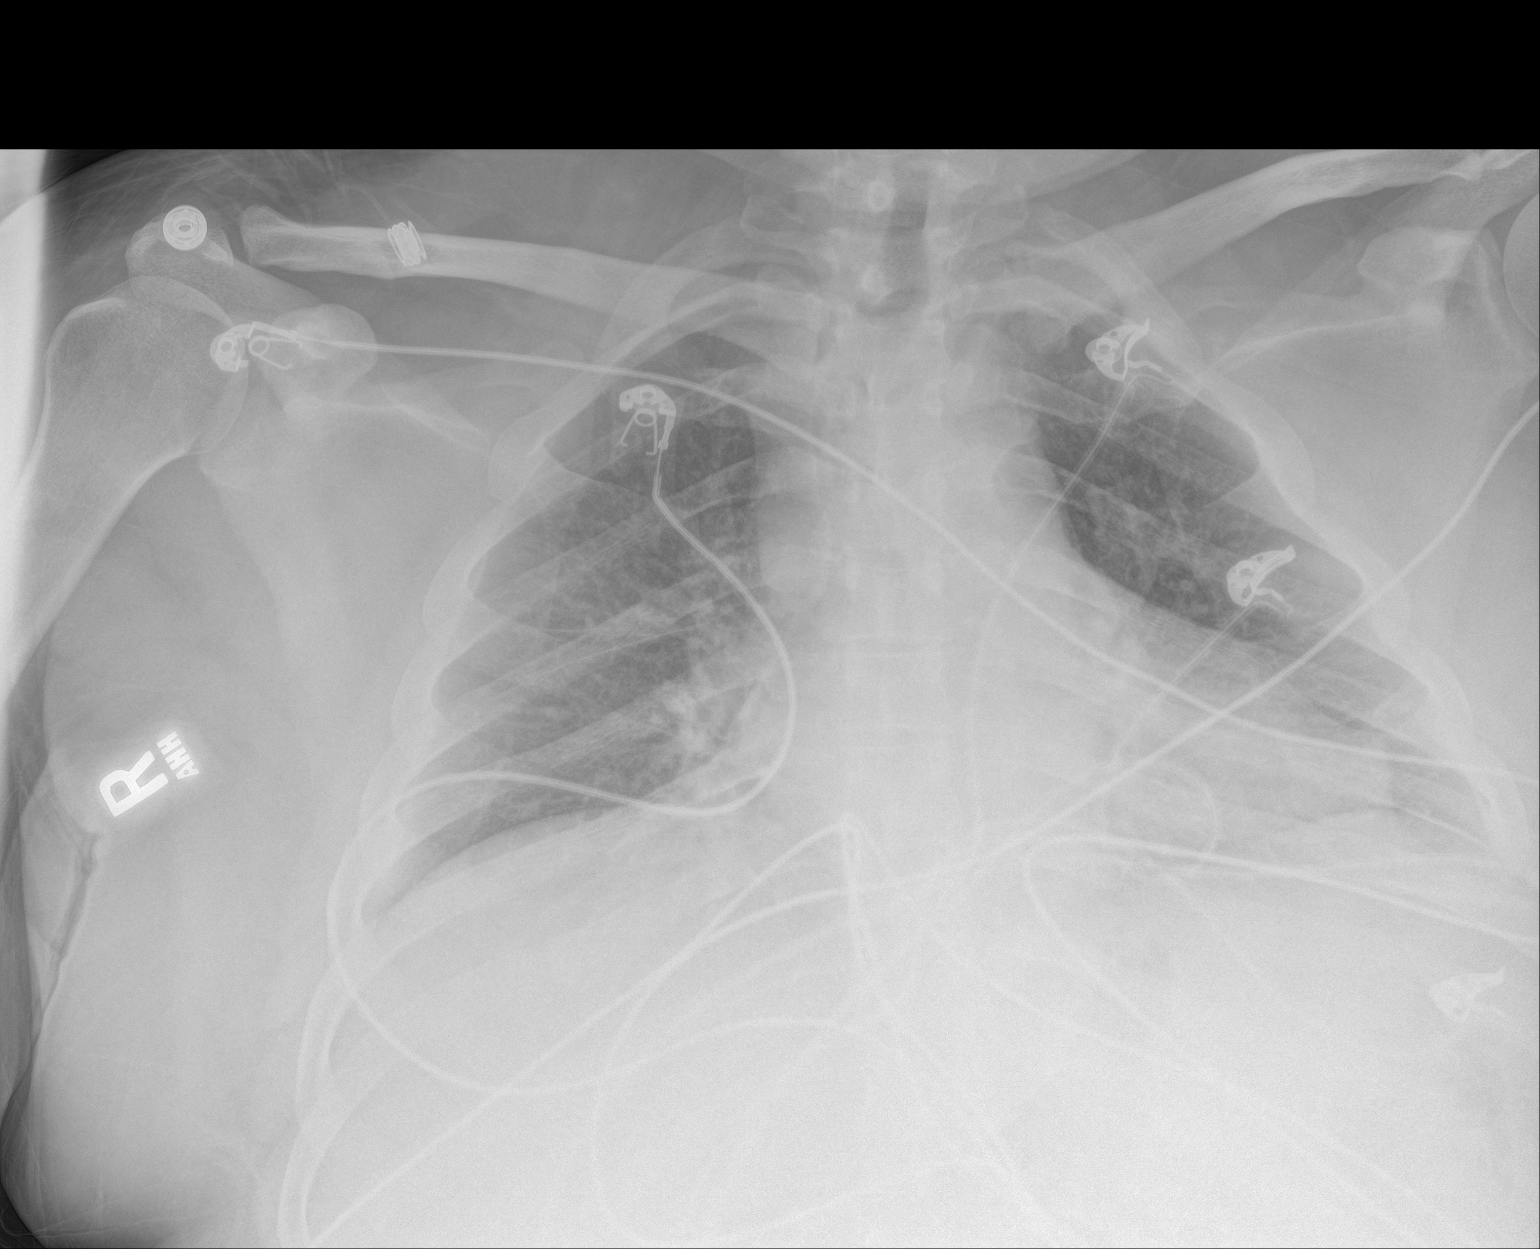

[2 of 2 positions shown; findings below may reference images not displayed]

FINDINGS: Borderline cardiomegaly. Study is limited by poor inspiration and
patient's large body habitus. No gross infiltrate or pulmonary
edema. Mild left basilar atelectasis.
IMPRESSION: Limited study by patient's large body habitus. No gross infiltrate
or pulmonary edema. Cardiomegaly. Left basilar atelectasis.

## 2015-08-05 MED ORDER — POTASSIUM CHLORIDE 10 MEQ/100ML IV SOLN
10.0000 meq | INTRAVENOUS | Status: AC
  Administered 2015-08-05 (×2): 10 meq via INTRAVENOUS
  Filled 2015-08-05: qty 100

## 2015-08-05 MED ORDER — SODIUM CHLORIDE 0.9 % IV SOLN
INTRAVENOUS | Status: DC
  Administered 2015-08-05: 5.4 [IU]/h via INTRAVENOUS
  Filled 2015-08-05 (×2): qty 2.5

## 2015-08-05 MED ORDER — CHLORHEXIDINE GLUCONATE 0.12 % MT SOLN
15.0000 mL | Freq: Two times a day (BID) | OROMUCOSAL | Status: DC
  Administered 2015-08-06 – 2015-08-19 (×18): 15 mL via OROMUCOSAL
  Filled 2015-08-05 (×20): qty 15

## 2015-08-05 MED ORDER — DEXTROSE-NACL 5-0.45 % IV SOLN
INTRAVENOUS | Status: DC
Start: 2015-08-05 — End: 2015-08-06

## 2015-08-05 MED ORDER — VANCOMYCIN HCL IN DEXTROSE 1-5 GM/200ML-% IV SOLN
1000.0000 mg | Freq: Once | INTRAVENOUS | Status: AC
  Administered 2015-08-05: 1000 mg via INTRAVENOUS
  Filled 2015-08-05: qty 200

## 2015-08-05 MED ORDER — SODIUM CHLORIDE 0.9 % IV BOLUS (SEPSIS)
1000.0000 mL | Freq: Once | INTRAVENOUS | Status: AC
  Administered 2015-08-05: 1000 mL via INTRAVENOUS

## 2015-08-05 MED ORDER — PIPERACILLIN-TAZOBACTAM 3.375 G IVPB 30 MIN
3.3750 g | Freq: Once | INTRAVENOUS | Status: AC
  Administered 2015-08-05: 3.375 g via INTRAVENOUS
  Filled 2015-08-05: qty 50

## 2015-08-05 MED ORDER — SODIUM CHLORIDE 0.9 % IV SOLN
INTRAVENOUS | Status: DC
Start: 2015-08-05 — End: 2015-08-06
  Administered 2015-08-05 (×2): via INTRAVENOUS

## 2015-08-05 MED ORDER — HALOPERIDOL LACTATE 5 MG/ML IJ SOLN
1.0000 mg | INTRAMUSCULAR | Status: DC | PRN
  Administered 2015-08-06: 4 mg via INTRAVENOUS
  Administered 2015-08-06: 2 mg via INTRAVENOUS
  Administered 2015-08-06: 4 mg via INTRAVENOUS
  Administered 2015-08-07 (×2): 2 mg via INTRAVENOUS
  Filled 2015-08-05 (×8): qty 1

## 2015-08-05 MED ORDER — POTASSIUM CHLORIDE 10 MEQ/100ML IV SOLN
10.0000 meq | INTRAVENOUS | Status: DC
  Administered 2015-08-05: 10 meq via INTRAVENOUS
  Filled 2015-08-05: qty 100

## 2015-08-05 MED ORDER — PIPERACILLIN-TAZOBACTAM IN DEX 2-0.25 GM/50ML IV SOLN
2.2500 g | Freq: Three times a day (TID) | INTRAVENOUS | Status: DC
  Administered 2015-08-05 – 2015-08-06 (×2): 2.25 g via INTRAVENOUS
  Filled 2015-08-05 (×3): qty 50

## 2015-08-05 MED ORDER — SODIUM CHLORIDE 0.9 % IV SOLN
1500.0000 mg | Freq: Once | INTRAVENOUS | Status: AC
  Administered 2015-08-05: 1500 mg via INTRAVENOUS
  Filled 2015-08-05: qty 1500

## 2015-08-05 MED ORDER — CETYLPYRIDINIUM CHLORIDE 0.05 % MT LIQD
7.0000 mL | Freq: Two times a day (BID) | OROMUCOSAL | Status: DC
  Administered 2015-08-06 – 2015-08-19 (×13): 7 mL via OROMUCOSAL

## 2015-08-05 MED ORDER — SODIUM CHLORIDE 0.9 % IV SOLN
Freq: Once | INTRAVENOUS | Status: AC
  Administered 2015-08-05: 1660 mL via INTRAVENOUS

## 2015-08-05 NOTE — Progress Notes (Signed)
CRITICAL VALUE ALERT  Critical value received:  Glucose 1022 & Potassium 3.1  Date of notification: 08/05/15  Time of notification:  1802  Critical value read back:Yes.    Nurse who received alert:  Duard Larsen, RN  MD notified (1st page):  Dr. Nelda Marseille  Potassium replaced.

## 2015-08-05 NOTE — ED Notes (Signed)
Pt from home. Last seen normal yesterday at noon, although family spoke with pt over the phone at 1700 last night. Girlfriend went to see pt this am, but the doors to the house were locked and pt was yelling from inside. Pt found on the floor, unknown how long he was on the floor. Pt does not remember how he got there. Pt cold to touch and hypotensive. Rectal temp of 94.53F rectal. EMS BP was 75/40, gave 565ml NS prior to arrival. Pt drowsy, but answering questions correctly. Hx of DM, CBG read "high" (over 500) with EMS.

## 2015-08-05 NOTE — ED Notes (Signed)
Phlebotomy at bedside to obtain labs.

## 2015-08-05 NOTE — ED Notes (Signed)
GLU greater >700 reported to Dr Oleta Mouse, and RN Tyrone Apple

## 2015-08-05 NOTE — ED Notes (Signed)
Bed: [id number] Expected date: 08/05/15 Expected time: 10:26 AM Means of arrival: Ambulance Comments: Fall, Hypotensive, Hyperglycemic

## 2015-08-05 NOTE — Progress Notes (Signed)
Pharmacy Antibiotic Note  Micheal Gomez is a 60 y.o. male admitted on 08/05/2015 with r/o sepsis, possible UTI.  Pharmacy has been consulted for Zosyn/Vancomycin dosing.  Patient has AKI with CrCl~18 ml/min (CG), ~14 ml/min (normalized).  Plan: - Provide an additional 1500 mg of Vancomycin to the 1000 mg dose given in ED for a total loading dose of 2500 mg.  Will not order a maintenance dose at this time.  Will f/u SCr and levels to determine when to schedule next vancomycin dose. - Zosyn 2.25g IV q8h.  Height: 5\' 9"  (175.3 cm) Weight: 270 lb (122.471 kg) IBW/kg (Calculated) : 70.7  Temp (24hrs), Avg:93.7 F (34.3 C), Min:93 F (33.9 C), Max:94.7 F (34.8 C)   Recent Labs Lab 08/05/15 1052 08/05/15 1103 08/05/15 1113 08/05/15 1404  WBC 17.3*  --   --   --   CREATININE 6.48*  --  5.60*  --   LATICACIDVEN  --  3.93*  --  2.28*    Estimated Creatinine Clearance: 18.1 mL/min (by C-G formula based on Cr of 5.6).    Allergies  Allergen Reactions  . No Known Allergies     Antimicrobials this admission: 2/3 Vanc >>  2/3 Zosyn >>  Levels/dose changes this admission: -  Microbiology Results: 2/3 BCx: sent 2/3 UCx: sent   Thank you for allowing pharmacy to be a part of this patient's care.  Hershal Coria 08/05/2015 2:48 PM

## 2015-08-05 NOTE — ED Notes (Signed)
Unable to collect urinae at this time.

## 2015-08-05 NOTE — ED Notes (Signed)
Per CT tech, pt is unable to do CT at this time d/t being combative and in medical restraints for pulling IV's and catheter

## 2015-08-05 NOTE — H&P (Signed)
PULMONARY / CRITICAL CARE MEDICINE   Name: Micheal Gomez MRN: [id number] DOB: July 22, 1955    ADMISSION DATE:  08/05/2015 CONSULTATION DATE:  2/3  REFERRING MD:  Oleta Mouse   CHIEF COMPLAINT:  DKA  HISTORY OF PRESENT ILLNESS:   This is a 60 year old male w/ PMH of HTN and DM (not on treatment). Recently dx'd w/ OSA and started CPAP. Was in usual state of health until about 2d prior to admission when he began to c/o light-headedness. He had been contributing this to his CPAP machine. On 2/3 he was found by his fiancee on the floor w/ altered mental status. He transported to the ER via EMS. Initial eval showed: severe metabolic acidosis, + Beta-hydroxybutyric acid, hypotension and AMS. PCCM was asked to admit.   PAST MEDICAL HISTORY :  He  has a past medical history of Hypertension and Diabetes mellitus without complication ([place]).  PAST SURGICAL HISTORY: He  has past surgical history that includes Appendectomy.  Allergies  Allergen Reactions  . No Known Allergies     No current facility-administered medications on file prior to encounter.   No current outpatient prescriptions on file prior to encounter.    FAMILY HISTORY:  His has no family status information on file.   SOCIAL HISTORY: He  reports that he has never smoked. He has never used smokeless tobacco. He reports that he does not drink alcohol or use illicit drugs.  REVIEW OF SYSTEMS:   Unable   SUBJECTIVE:  Agitated at times  VITAL SIGNS: BP 120/57 mmHg  Pulse 88  Temp(Src) 94.4 F (34.7 C) (Rectal)  Resp 25  Ht 5\' 9"  (1.753 m)  Wt 270 lb (122.471 kg)  BMI 39.85 kg/m2  SpO2 95%  HEMODYNAMICS:    VENTILATOR SETTINGS:    INTAKE / OUTPUT:    PHYSICAL EXAMINATION: General:  Obese 59 yom, restless and agitated at times Neuro:  Sp slurred. Moves all ext. No strength def  HEENT:  MM are dry and cracked, neck veins flat  Cardiovascular:  Rrr, no MRG Lungs:  Clear, no accessory muscle use  Abdomen:  Soft, not  tender + bowel sounds Musculoskeletal:  Good strength and bulk Skin:  Warm, dry   LABS:  BMET  Recent Labs Lab 08/05/15 1052 08/05/15 1113  NA 144 144  K 4.9 4.8  CL 99* 106  CO2 12*  --   BUN 103* 107*  CREATININE 6.48* 5.60*  GLUCOSE 1548* >700*    Electrolytes  Recent Labs Lab 08/05/15 1052  CALCIUM 8.7*    CBC  Recent Labs Lab 08/05/15 1052 08/05/15 1113  WBC 17.3*  --   HGB 16.2 18.0*  HCT 48.4 53.0*  PLT 206  --     Coag's No results for input(s): APTT, INR in the last 168 hours.  Sepsis Markers  Recent Labs Lab 08/05/15 1103  LATICACIDVEN 3.93*    ABG  Recent Labs Lab 08/05/15 1348  PHART 7.138*  PCO2ART 25.8*  PO2ART 76.4*    Liver Enzymes  Recent Labs Lab 08/05/15 1052  AST 55*  ALT 42  ALKPHOS 132*  BILITOT 1.6*  ALBUMIN 3.9    Cardiac Enzymes No results for input(s): TROPONINI, PROBNP in the last 168 hours.  Glucose  Recent Labs Lab 08/05/15 1333  GLUCAP >600*    Imaging No results found.   STUDIES:  UDS 2/3>>>  CULTURES: UC 2.3>>> BCX2 2/3>>>  ANTIBIOTICS: vanc 2/3>>> Zosyn 2/3>>  SIGNIFICANT EVENTS:   LINES/TUBES:   DISCUSSION:  60 year old male w/ "pre-diabetes". Admitted on 2/3 w/ AMS, metabolic acidosis and DKA. Admit to ICU, start DKA protocol, pan culture and add empiric abx given dirty urine. Will watch closely for intubation given inadequate respiratory compensation on initial VBG.   ASSESSMENT / PLAN:  PULMONARY A: H/o OSA Mild respiratory acidosis   P:   Admit to ICU CPAP when MS allows F/u abg Pulse ox   CARDIOVASCULAR A:  Hypovolemic shock w/ elevated lactic acid  P:  Cont IVFs See ID section  F/u lactic acid   RENAL A:   +AGMA  AKI P:   Cont IVFs See renal sxn  Strict I&O Trend bmp  GASTROINTESTINAL A:   No acute  P:   NPO   HEMATOLOGIC A:   Probable hemoconcentration, anticipate that his hgb will drift down further  P:  F/u cbc SCDs for now  given MS and inability to CT head  Transfuse per icu protocol   INFECTIOUS A:   Leukocytosis/SIRS Possible UTI  P:   Cont current abx for now F/u PCT and d/c abx in am if neg   ENDOCRINE A:   DKA:  His Beta-hydroxyburic acid is > 8 & he has severe metabolic acidosis a P:   DKA protocol  Aggressive IV hydration  F/u serum osmo Ck Hgb A1c  NEUROLOGIC A:   Acute Encephalopathy in setting of TME/DKA P:   RASS goal: 0 Hold sedation  Supportive care    FAMILY  - Updates:   - Inter-disciplinary family meet or Palliative Care meeting due by:  2/7  Erick Colace ACNP-BC [place] Pager # [phone] OR # [phone] if no answer   08/05/2015, 2:01 PM

## 2015-08-05 NOTE — ED Notes (Signed)
Fluid order placed per Kary Kos, NP based on pt weight and 59ml/k protocol

## 2015-08-05 NOTE — ED Provider Notes (Signed)
CSN: [id number]     Arrival date & time 08/05/15  1026 History   First MD Initiated Contact with Patient 08/05/15 1032     Chief Complaint  Patient presents with  . Hypotension  . Altered Mental Status     (Consider location/radiation/quality/duration/timing/severity/associated sxs/prior Treatment) HPI  Level V caveat due to AMs.  History is provided by EMS who states that patient was found altered lying on the ground in his home this morning. A friend had called EMS as she was unable to get a hold of him. She had told EMS that he had been on well over the past 2 days, and seems confused, with unsteady gait and slight slurring of his speech. She had last spoken to him at around 5 PM yesterday, and last saw him at around noon yesterday. On EMS arrival he was noted to be alert, but disoriented. He was noted to have a "HIGH" blood glucose. I was also hypothermic and hypotensive with a blood pressure 75 systolic. He did receive a bolus of 500 mL of IV fluids. Subsequently brought to the ED for further evaluation.    Past Medical History  Diagnosis Date  . Hypertension   . Diabetes mellitus without complication [facility])    Past Surgical History  Procedure Laterality Date  . Appendectomy     History reviewed. No pertinent family history. Social History  Substance Use Topics  . Smoking status: Never Smoker   . Smokeless tobacco: Never Used  . Alcohol Use: No    Review of Systems  Unable to perform ROS: Mental status change      Allergies  No known allergies  Home Medications   Prior to Admission medications   Medication Sig Start Date End Date Taking? Authorizing Provider  aspirin 500 MG tablet Take 500 mg by mouth every 6 (six) hours as needed for pain.   Yes Historical Provider, MD  CALCIUM PO Take 1 tablet by mouth daily. Calcium Complete   Yes Historical Provider, MD  chlorthalidone (HYGROTON) 25 MG tablet Take 25 mg by mouth daily.   Yes Historical Provider, MD  metoprolol  (LOPRESSOR) 50 MG tablet Take 50 mg by mouth 2 (two) times daily.   Yes Historical Provider, MD  Multiple Vitamins-Minerals (MULTIVITAMIN WITH MINERALS) tablet Take 1 tablet by mouth daily.   Yes Historical Provider, MD  omeprazole (PRILOSEC) 20 MG capsule Take 40 mg by mouth daily.   Yes Historical Provider, MD   BP 142/58 mmHg  Pulse 97  Temp(Src) 97.9 F (36.6 C) (Rectal)  Resp 25  Ht 5\' 9"  (1.753 m)  Wt 270 lb (122.471 kg)  BMI 39.85 kg/m2  SpO2 97% Physical Exam  Constitutional:  Well-nourished, awake, appears unwell  HENT:  Head: Normocephalic and atraumatic.  Mucous membranes extremely dry.  Eyes: Conjunctivae are normal. Pupils are equal, round, and reactive to light.  Neck: Neck supple.  Cardiovascular: Normal rate and normal heart sounds.   Pulmonary/Chest: Effort normal and breath sounds normal.  Abdominal: Soft. Bowel sounds are normal. He exhibits no distension. There is no tenderness.  Musculoskeletal:  No deformities.  Neurological: He is alert.  Oriented to name only. No facial droop. Slow and slurred speech. Moves all extremities to command  Skin: Skin is dry.  Cool to touch  Psychiatric:  Cooperative    ED Course  Procedures (including critical care time) Labs Review Labs Reviewed  COMPREHENSIVE METABOLIC PANEL - Abnormal; Notable for the following:    Chloride 99 (*)  CO2 12 (*)    Glucose, Bld 1548 (*)    BUN 103 (*)    Creatinine, Ser 6.48 (*)    Calcium 8.7 (*)    Total Protein 8.2 (*)    AST 55 (*)    Alkaline Phosphatase 132 (*)    Total Bilirubin 1.6 (*)    GFR calc non Af Amer 8 (*)    GFR calc Af Amer 10 (*)    Anion gap 33 (*)    All other components within normal limits  CBC WITH DIFFERENTIAL/PLATELET - Abnormal; Notable for the following:    WBC 17.3 (*)    RBC 5.99 (*)    Neutro Abs 14.9 (*)    Monocytes Absolute 1.2 (*)    All other components within normal limits  URINALYSIS, ROUTINE W REFLEX MICROSCOPIC (NOT AT [facility]) -  Abnormal; Notable for the following:    APPearance CLOUDY (*)    Glucose, UA >1000 (*)    Hgb urine dipstick LARGE (*)    Bilirubin Urine MODERATE (*)    Ketones, ur 15 (*)    Protein, ur 100 (*)    All other components within normal limits  CK - Abnormal; Notable for the following:    Total CK 3356 (*)    All other components within normal limits  OSMOLALITY - Abnormal; Notable for the following:    Osmolality 457 (*)    All other components within normal limits  BLOOD GAS, VENOUS - Abnormal; Notable for the following:    pH, Ven 7.064 (*)    Bicarbonate 13.1 (*)    Acid-Base Excess 15.2 (*)    All other components within normal limits  BETA-HYDROXYBUTYRIC ACID - Abnormal; Notable for the following:    Beta-Hydroxybutyric Acid >8.00 (*)    All other components within normal limits  LIPASE, BLOOD - Abnormal; Notable for the following:    Lipase 263 (*)    All other components within normal limits  TROPONIN I - Abnormal; Notable for the following:    Troponin I 0.07 (*)    All other components within normal limits  BLOOD GAS, ARTERIAL - Abnormal; Notable for the following:    pH, Arterial 7.138 (*)    pCO2 arterial 25.8 (*)    pO2, Arterial 76.4 (*)    Bicarbonate 8.7 (*)    Acid-base deficit 19.8 (*)    All other components within normal limits  URINE MICROSCOPIC-ADD ON - Abnormal; Notable for the following:    Squamous Epithelial / LPF 6-30 (*)    Bacteria, UA MANY (*)    Casts HYALINE CASTS (*)    All other components within normal limits  GLUCOSE, CAPILLARY - Abnormal; Notable for the following:    Glucose-Capillary >600 (*)    All other components within normal limits  GLUCOSE, CAPILLARY - Abnormal; Notable for the following:    Glucose-Capillary >600 (*)    All other components within normal limits  GLUCOSE, CAPILLARY - Abnormal; Notable for the following:    Glucose-Capillary >600 (*)    All other components within normal limits  I-STAT CG4 LACTIC ACID, ED -  Abnormal; Notable for the following:    Lactic Acid, Venous 3.93 (*)    All other components within normal limits  I-STAT CHEM 8, ED - Abnormal; Notable for the following:    BUN 107 (*)    Creatinine, Ser 5.60 (*)    Glucose, Bld >700 (*)    Calcium, Ion 1.07 (*)  Hemoglobin 18.0 (*)    HCT 53.0 (*)    All other components within normal limits  I-STAT CG4 LACTIC ACID, ED - Abnormal; Notable for the following:    Lactic Acid, Venous 2.28 (*)    All other components within normal limits  CBG MONITORING, ED - Abnormal; Notable for the following:    Glucose-Capillary >600 (*)    All other components within normal limits  CBG MONITORING, ED - Abnormal; Notable for the following:    Glucose-Capillary >600 (*)    All other components within normal limits  MRSA PCR SCREENING  CULTURE, BLOOD (ROUTINE X 2)  CULTURE, BLOOD (ROUTINE X 2)  URINE CULTURE  PROCALCITONIN  URINE RAPID DRUG SCREEN, HOSP PERFORMED  BASIC METABOLIC PANEL  BASIC METABOLIC PANEL  TROPONIN I  TROPONIN I  CBC  BASIC METABOLIC PANEL  MAGNESIUM  PHOSPHORUS  BLOOD GAS, ARTERIAL  CALCIUM, IONIZED  BASIC METABOLIC PANEL  BASIC METABOLIC PANEL  CBC  MAGNESIUM  PHOSPHORUS  I-STAT TROPOININ, ED    Imaging Review Dg Chest 1 View  08/05/2015  CLINICAL DATA:  Altered mental status EXAM: CHEST 1 VIEW COMPARISON:  08/26/2013 FINDINGS: Borderline cardiomegaly. Study is limited by poor inspiration and patient's large body habitus. No gross infiltrate or pulmonary edema. Mild left basilar atelectasis. IMPRESSION: Limited study by patient's large body habitus. No gross infiltrate or pulmonary edema. Cardiomegaly. Left basilar atelectasis. Electronically Signed   By: Lahoma Crocker M.D.   On: 08/05/2015 14:18   I have personally reviewed and evaluated these images and lab results as part of my medical decision-making.   EKG Interpretation None     CRITICAL CARE Performed by: Forde Dandy   Total critical care time: 30  minutes  Critical care time was exclusive of separately billable procedures and treating other patients.  Critical care was necessary to treat or prevent imminent or life-threatening deterioration.  Critical care was time spent personally by me on the following activities: development of treatment plan with patient and/or surrogate as well as nursing, discussions with consultants, evaluation of patient's response to treatment, examination of patient, obtaining history from patient or surrogate, ordering and performing treatments and interventions, ordering and review of laboratory studies, ordering and review of radiographic studies, pulse oximetry and re-evaluation of patient's condition.  MDM   Final diagnoses:  Acute renal failure, unspecified acute renal failure type (HCC)  Sepsis, due to unspecified organism [place] [facility])  Diabetic ketoacidosis with coma associated with type 2 diabetes mellitus ([place])   60 year old male with history of HTN and DM who presents with AMS.  Hypothermic on arrival, and bair hugger applied. Hypotension resolved after 500 cc IVF given by EMS. On room air, and protecting airway. Awake and responds to name and obeys simple commands. Appears encephalopathic (metabolic from DKA), but grossly neuro in tact.  With severe DKA. PH 7.0 with glucose > 1500, AG 30, and bicarbonate of 13.  Concern for underlying sepsis, and covered empirically with antibiotics (vanc zosyn) with blood cultures, UA, urine culture, CXR pending. Evidence of end organ dysfunction with elevated lactic acid and acute renal failure. May be setting of hypovolemia from DKA, and covered for sepsis.  Given 2 L of IVF. Potassium normal and started on IVF with KCl and started on insulin gtt. Discussed with ICU and admitted for ongoing management.   Forde Dandy, MD 08/05/15 1728

## 2015-08-06 ENCOUNTER — Inpatient Hospital Stay (HOSPITAL_COMMUNITY): Payer: Non-veteran care

## 2015-08-06 DIAGNOSIS — N179 Acute kidney failure, unspecified: Secondary | ICD-10-CM

## 2015-08-06 DIAGNOSIS — G9340 Encephalopathy, unspecified: Secondary | ICD-10-CM

## 2015-08-06 DIAGNOSIS — M6282 Rhabdomyolysis: Secondary | ICD-10-CM

## 2015-08-06 DIAGNOSIS — E870 Hyperosmolality and hypernatremia: Secondary | ICD-10-CM

## 2015-08-06 LAB — BASIC METABOLIC PANEL
Anion gap: 18 — ABNORMAL HIGH (ref 5–15)
Anion gap: 19 — ABNORMAL HIGH (ref 5–15)
BUN: 113 mg/dL — AB (ref 6–20)
BUN: 103 mg/dL — AB (ref 6–20)
BUN: 108 mg/dL — AB (ref 6–20)
BUN: 107 mg/dL — AB (ref 6–20)
CALCIUM: 7.6 mg/dL — AB (ref 8.9–10.3)
CALCIUM: 7.5 mg/dL — AB (ref 8.9–10.3)
CALCIUM: 6.8 mg/dL — AB (ref 8.9–10.3)
CO2: 20 mmol/L — AB (ref 22–32)
CO2: 23 mmol/L (ref 22–32)
CO2: 17 mmol/L — AB (ref 22–32)
CO2: 14 mmol/L — AB (ref 22–32)
CREATININE: 5.59 mg/dL — AB (ref 0.61–1.24)
CREATININE: 6.46 mg/dL — AB (ref 0.61–1.24)
CREATININE: 7.4 mg/dL — AB (ref 0.61–1.24)
Calcium: 8.2 mg/dL — ABNORMAL LOW (ref 8.9–10.3)
Chloride: >130 mmol/L (ref 101–111)
Chloride: >130 mmol/L (ref 101–111)
Chloride: 130 mmol/L — ABNORMAL HIGH (ref 101–111)
Chloride: 126 mmol/L — ABNORMAL HIGH (ref 101–111)
Creatinine, Ser: 6.07 mg/dL — ABNORMAL HIGH (ref 0.61–1.24)
GFR calc Af Amer: 12 mL/min — ABNORMAL LOW (ref >60)
GFR calc Af Amer: 10 mL/min — ABNORMAL LOW (ref >60)
GFR calc Af Amer: 10 mL/min — ABNORMAL LOW (ref >60)
GFR calc Af Amer: 8 mL/min — ABNORMAL LOW (ref >60)
GFR calc non Af Amer: 8 mL/min — ABNORMAL LOW (ref >60)
GFR calc non Af Amer: 7 mL/min — ABNORMAL LOW (ref >60)
GFR, EST NON AFRICAN AMERICAN: 10 mL/min — AB (ref >60)
GFR, EST NON AFRICAN AMERICAN: 9 mL/min — AB (ref >60)
GLUCOSE: 207 mg/dL — AB (ref 65–99)
GLUCOSE: 200 mg/dL — AB (ref 65–99)
GLUCOSE: 177 mg/dL — AB (ref 65–99)
GLUCOSE: 452 mg/dL — AB (ref 65–99)
Potassium: 3.4 mmol/L — ABNORMAL LOW (ref 3.5–5.1)
Potassium: 4.7 mmol/L (ref 3.5–5.1)
Potassium: 3.9 mmol/L (ref 3.5–5.1)
Potassium: 4.7 mmol/L (ref 3.5–5.1)
Sodium: 166 mmol/L (ref 135–145)
Sodium: 169 mmol/L (ref 135–145)
Sodium: 165 mmol/L (ref 135–145)
Sodium: 159 mmol/L — ABNORMAL HIGH (ref 135–145)

## 2015-08-06 LAB — TROPONIN I: Troponin I: 0.49 ng/mL — ABNORMAL HIGH (ref <0.031)

## 2015-08-06 LAB — URINE MICROSCOPIC-ADD ON

## 2015-08-06 LAB — URINALYSIS, ROUTINE W REFLEX MICROSCOPIC
Glucose, UA: 250 mg/dL — AB
KETONES UR: NEGATIVE mg/dL
NITRITE: NEGATIVE
PH: 5.5 (ref 5.0–8.0)
Protein, ur: >300 mg/dL — AB
Specific Gravity, Urine: 1.024 (ref 1.005–1.030)

## 2015-08-06 LAB — GLUCOSE, CAPILLARY
GLUCOSE-CAPILLARY: 172 mg/dL — AB (ref 65–99)
GLUCOSE-CAPILLARY: 150 mg/dL — AB (ref 65–99)
GLUCOSE-CAPILLARY: 129 mg/dL — AB (ref 65–99)
GLUCOSE-CAPILLARY: 147 mg/dL — AB (ref 65–99)
GLUCOSE-CAPILLARY: 191 mg/dL — AB (ref 65–99)
GLUCOSE-CAPILLARY: 171 mg/dL — AB (ref 65–99)
GLUCOSE-CAPILLARY: 167 mg/dL — AB (ref 65–99)
GLUCOSE-CAPILLARY: 180 mg/dL — AB (ref 65–99)
GLUCOSE-CAPILLARY: 225 mg/dL — AB (ref 65–99)
GLUCOSE-CAPILLARY: 366 mg/dL — AB (ref 65–99)
GLUCOSE-CAPILLARY: 412 mg/dL — AB (ref 65–99)
Glucose-Capillary: 158 mg/dL — ABNORMAL HIGH (ref 65–99)
Glucose-Capillary: 195 mg/dL — ABNORMAL HIGH (ref 65–99)
Glucose-Capillary: 242 mg/dL — ABNORMAL HIGH (ref 65–99)
Glucose-Capillary: 286 mg/dL — ABNORMAL HIGH (ref 65–99)

## 2015-08-06 LAB — URINE CULTURE: CULTURE: NO GROWTH

## 2015-08-06 LAB — CBC
HCT: 41.5 % (ref 39.0–52.0)
HEMOGLOBIN: 14.1 g/dL (ref 13.0–17.0)
MCH: 27.5 pg (ref 26.0–34.0)
MCHC: 34 g/dL (ref 30.0–36.0)
MCV: 81.1 fL (ref 78.0–100.0)
PLATELETS: 128 10*3/uL — AB (ref 150–400)
RBC: 5.12 MIL/uL (ref 4.22–5.81)
RDW: 14.9 % (ref 11.5–15.5)
WBC: 10.4 10*3/uL (ref 4.0–10.5)

## 2015-08-06 LAB — SODIUM, URINE, RANDOM: Sodium, Ur: 50 mmol/L

## 2015-08-06 LAB — CREATININE, URINE, RANDOM: CREATININE, URINE: 249.92 mg/dL

## 2015-08-06 LAB — PHOSPHORUS: PHOSPHORUS: 2.6 mg/dL (ref 2.5–4.6)

## 2015-08-06 LAB — CALCIUM, IONIZED: CALCIUM, IONIZED, SERUM: 4.1 mg/dL — AB (ref 4.5–5.6)

## 2015-08-06 LAB — MAGNESIUM: MAGNESIUM: 3.4 mg/dL — AB (ref 1.7–2.4)

## 2015-08-06 MED ORDER — SODIUM CHLORIDE 0.9 % IV SOLN
INTRAVENOUS | Status: DC
  Administered 2015-08-06: 7 [IU]/h via INTRAVENOUS
  Filled 2015-08-06: qty 2.5

## 2015-08-06 MED ORDER — HALOPERIDOL LACTATE 5 MG/ML IJ SOLN
4.0000 mg | Freq: Once | INTRAMUSCULAR | Status: AC
  Administered 2015-08-06: 4 mg via INTRAVENOUS

## 2015-08-06 MED ORDER — SODIUM CHLORIDE 0.9 % IV BOLUS (SEPSIS)
1000.0000 mL | Freq: Once | INTRAVENOUS | Status: AC
  Administered 2015-08-06: 1000 mL via INTRAVENOUS

## 2015-08-06 MED ORDER — POTASSIUM CL IN DEXTROSE 5% 20 MEQ/L IV SOLN
20.0000 meq | INTRAVENOUS | Status: DC
  Administered 2015-08-06 (×2): 20 meq via INTRAVENOUS
  Filled 2015-08-06 (×4): qty 1000

## 2015-08-06 MED ORDER — POTASSIUM CHLORIDE IN NACL 20-0.45 MEQ/L-% IV SOLN
INTRAVENOUS | Status: DC
  Administered 2015-08-06: via INTRAVENOUS
  Filled 2015-08-06: qty 1000

## 2015-08-06 MED ORDER — SODIUM CHLORIDE 0.9 % IV BOLUS (SEPSIS)
500.0000 mL | Freq: Once | INTRAVENOUS | Status: AC
  Administered 2015-08-06: 500 mL via INTRAVENOUS

## 2015-08-06 MED ORDER — DEXTROSE 5 % IV SOLN
2.0000 g | Freq: Every day | INTRAVENOUS | Status: AC
  Administered 2015-08-06 – 2015-08-09 (×4): 2 g via INTRAVENOUS
  Filled 2015-08-06 (×4): qty 2

## 2015-08-06 MED ORDER — POTASSIUM CL IN DEXTROSE 5% 20 MEQ/L IV SOLN
20.0000 meq | INTRAVENOUS | Status: DC
  Filled 2015-08-06 (×2): qty 1000

## 2015-08-06 MED ORDER — KCL IN DEXTROSE-NACL 20-5-0.45 MEQ/L-%-% IV SOLN
INTRAVENOUS | Status: DC
  Administered 2015-08-06: 02:00:00 via INTRAVENOUS
  Filled 2015-08-06: qty 1000

## 2015-08-06 MED ORDER — POTASSIUM CL IN DEXTROSE 5% 20 MEQ/L IV SOLN
20.0000 meq | INTRAVENOUS | Status: DC
  Administered 2015-08-06: 20 meq via INTRAVENOUS
  Filled 2015-08-06 (×2): qty 1000

## 2015-08-06 MED ORDER — CEFTRIAXONE SODIUM 1 G IJ SOLR: 1.0000 g | INTRAMUSCULAR | Status: DC

## 2015-08-06 MED ORDER — HYDRALAZINE HCL 20 MG/ML IJ SOLN
10.0000 mg | INTRAMUSCULAR | Status: DC | PRN
  Administered 2015-08-09 – 2015-08-10 (×2): 20 mg via INTRAVENOUS
  Filled 2015-08-06 (×2): qty 1

## 2015-08-06 NOTE — H&P (Signed)
PULMONARY / CRITICAL CARE MEDICINE   Name: Micheal Gomez MRN: [id number] DOB: Jan 29, 1956    ADMISSION DATE:  08/05/2015 CONSULTATION DATE:  2/3  REFERRING MD:  Oleta Mouse   CHIEF COMPLAINT:  DKA  BRIEF:   60 y/o male with "pre-diabetes", HTN, OSA admitted on 2/4 with acute encephalopathy and DKA with AKI and severe hypernatremia.   SUBJECTIVE:  Mental status improved Hyperglycemia improved Hypernatremia (when corrected for sodium) slowly improving  VITAL SIGNS: BP 147/62 mmHg  Pulse 74  Temp(Src) 99.3 F (37.4 C) (Core (Comment))  Resp 23  Ht 5\' 9"  (1.753 m)  Wt 270 lb (122.471 kg)  BMI 39.85 kg/m2  SpO2 98%  HEMODYNAMICS:    VENTILATOR SETTINGS:    INTAKE / OUTPUT: I/O last 3 completed shifts: In: 3970.8 [I.V.:3155.8; Other:15; IV Piggyback:800] Out: 1045 [Urine:1045]  PHYSICAL EXAMINATION: Gen: no acute distress HENT: OP clear, neck supple PULM: CTA B, normal effort CV: RRR, no mgr, trace edema GI: BS+, soft, nontender Derm: no cyanosis or rash Neuro: speech slurred and not oriented but easily redirected and able to carry on some conversation, inattentive; moves all four ext   LABS:  BMET  Recent Labs Lab 08/05/15 2309 08/06/15 0245 08/06/15 0655  NA 163* 166* 169*  K 3.1* 3.4* 4.7  CL 128* >130* >130*  CO2 19* 20* 23  BUN 102* 113* 103*  CREATININE 5.51* 5.59* 6.07*  GLUCOSE 496* 207* 200*    Electrolytes  Recent Labs Lab 08/05/15 1728 08/05/15 2309 08/06/15 0245 08/06/15 0655  CALCIUM 7.9* 7.8* 7.6* 8.2*  MG 3.6*  --  3.4*  --   PHOS 4.8*  --  2.6  --     CBC  Recent Labs Lab 08/05/15 1052 08/05/15 1113 08/05/15 1728 08/06/15 0245  WBC 17.3*  --  12.4* 10.4  HGB 16.2 18.0* 13.9 14.1  HCT 48.4 53.0* 42.0 41.5  PLT 206  --  160 128*    Coag's No results for input(s): APTT, INR in the last 168 hours.  Sepsis Markers  Recent Labs Lab 08/05/15 1103 08/05/15 1354 08/05/15 1404  LATICACIDVEN 3.93*  --  2.28*   PROCALCITON  --  2.56  --     ABG  Recent Labs Lab 08/05/15 1348 08/05/15 1700  PHART 7.138* 7.251*  PCO2ART 25.8* 28.1*  PO2ART 76.4* 83.2    Liver Enzymes  Recent Labs Lab 08/05/15 1052  AST 55*  ALT 42  ALKPHOS 132*  BILITOT 1.6*  ALBUMIN 3.9    Cardiac Enzymes  Recent Labs Lab 08/05/15 1354 08/05/15 1724 08/06/15 0245  TROPONINI 0.07* 0.12* 0.49*    Glucose  Recent Labs Lab 08/06/15 0211 08/06/15 0313 08/06/15 0413 08/06/15 0516 08/06/15 0619 08/06/15 0723  GLUCAP 172* 150* 158* 129* 147* 191*    Imaging Dg Chest 1 View  08/05/2015  CLINICAL DATA:  Altered mental status EXAM: CHEST 1 VIEW COMPARISON:  08/26/2013 FINDINGS: Borderline cardiomegaly. Study is limited by poor inspiration and patient's large body habitus. No gross infiltrate or pulmonary edema. Mild left basilar atelectasis. IMPRESSION: Limited study by patient's large body habitus. No gross infiltrate or pulmonary edema. Cardiomegaly. Left basilar atelectasis. Electronically Signed   By: Lahoma Crocker M.D.   On: 08/05/2015 14:18     STUDIES:  UDS 2/3>>>  CULTURES: UC 2.3>>> BCX2 2/3>>>  ANTIBIOTICS: vanc 2/3>>> 2/4 Zosyn 2/3>> 2/4 Ceftriaxone 2/4 >   SIGNIFICANT EVENTS:   LINES/TUBES:   DISCUSSION: 60 year old male w/ "pre-diabetes". Admitted on 2/3 w/ AMS,  metabolic acidosis and DKA.  Overnight his glucose has corrected and sodium has slowly come down when corrected for hyperglycemia.  Mental status slightly better.  Not sure if he has UTI but I do suspect he has rhabdomyolysis.  ASSESSMENT / PLAN:  RENAL A:   DKA causing anion gap acidosis, improving AKI >Possible rhabdomyolysis and volume depletion; now oliguric Hypernatremia Hyperosmolar state (on admission 457 mOsm/kg) P:   Renal consult now Continue D5W at 150cc/hr Allow to drink water today Target glucose 200-250 today to avoid further osmotic shifts Drop sodium by 9-12 mEq/24 hour period, so follow BMET q6h  and adjust D5W to maintain this Give 500cc saline bolus now Stop zosyn as hypernatremic  ENDOCRINE A:   DKA Hyperglycemia P:   Continue insulin drip today to target glucose 200-250  Diabetes coordinator  PULMONARY A: H/o OSA  P:   Maintain in ICU CPAP qHS Monitor O2 saturation  CARDIOVASCULAR A:  Hypertension Hypovolemia P:  Prn hydralazine Tele   GASTROINTESTINAL A:   No acute  P:   Water today by mouth Diet when mental status better  HEMATOLOGIC A:   Hemoconcentration P:  Monitor for bleeding  INFECTIOUS A:   UTI? doubt P:   Ceftriaxone for today Likely stop antibiotics tomorrow   NEUROLOGIC A:   Acute Encephalopathy in setting of hyperosmolar state, improving P:   Careful titration of insulin/sodium as above   FAMILY  - Updates: girlfriend updated bedside 2/4  - Inter-disciplinary family meet or Palliative Care meeting due by:  2/7  My cc time 42 minutes  Roselie Awkward, MD [place] [facility] Pager: [phone] Cell: [phone] After 3pm or if no response, call [phone]    08/06/2015, 8:01 AM

## 2015-08-06 NOTE — Progress Notes (Signed)
E-link nurse notified of 0300 BMET results. Orders received from physician.

## 2015-08-06 NOTE — Progress Notes (Signed)
eLink Physician-Brief Progress Note Patient Name: Micheal Gomez DOB: 02-Oct-1955 MRN: [id number]   Date of Service  08/06/2015  HPI/Events of Note  Oliguria.   eICU Interventions  Will order: 1. 0.9 NaCl 1 liter IV over 1 hour now.      Intervention Category Intermediate Interventions: Oliguria - evaluation and management  [name] 08/06/2015, 6:17 PM

## 2015-08-06 NOTE — Progress Notes (Addendum)
Inpatient Diabetes Program Recommendations  AACE/ADA: New Consensus Statement on Inpatient Glycemic Control (2015)  Target Ranges:  Prepandial:   less than 140 mg/dL      Peak postprandial:   less than 180 mg/dL (1-2 hours)      Critically ill patients:  140 - 180 mg/dL   Results for SELIG, WAMPOLE (MRN [id number]) as of 08/06/2015 08:57  Ref. Range 08/05/2015 10:52  Sodium Latest Ref Range: 135-145 mmol/L 144  Potassium Latest Ref Range: 3.5-5.1 mmol/L 4.9  Chloride Latest Ref Range: 101-111 mmol/L 99 (L)  CO2 Latest Ref Range: 22-32 mmol/L 12 (L)  BUN Latest Ref Range: 6-20 mg/dL 103 (H)  Creatinine Latest Ref Range: 0.61-1.24 mg/dL 6.48 (H)  Calcium Latest Ref Range: 8.9-10.3 mg/dL 8.7 (L)  EGFR (Non-African Amer.) Latest Ref Range: >60 mL/min 8 (L)  EGFR (African American) Latest Ref Range: >60 mL/min 10 (L)  Glucose Latest Ref Range: 65-99 mg/dL 1548 (HH)  Anion gap Latest Ref Range: 5-15  33 (H)    Admit with: DKA    History: Pre-Diabetes, HTN  Home DM Meds: None  Current Insulin Orders: IV Insulin drip per DKA Order set      -Called by RN caring for patient this AM.  Per RN, Dr. Lake Bells would like to change blood glucose range parameters on GlucoStabilizer software program to 200-250 mg/dl (currently glucose range parameters are set for target CBG of 140-180 mg/dl).  The [employer] can be adjusted by an Scientist, physiological, however, if we change the glucose range through the Administrative function it will alter the glucose range for the entire hospital system's DKA order set that will affect all patients currently on the DKA order set using the [place].    -Note that patient's CBG was less than 250 mg/dl at 1am this morning.  Per DKA orders set, Dextrose should be added to IVF when CBG less than 250 mg/dl.  Note that RN did not switch IVF to Dextrose containing fluids until 6:45 this AM.    -Spoke with Dr. Lake Bells by phone.  Explained to Dr.  Lake Bells the above information.  Dr. Lake Bells to d/c GlucoStabilizer and have RN manually titrate insulin drip per his orders.    Addendum 9:30am- Tilda Franco RN assisting primary RN with this patient.  Relayed to [place] that Dr. Lake Bells will be discontinuing the GlucoStabilizer and will have the RN check CBGs and call with results for insulin drip titration.  Asked Colletta [place] to please have primary RN caring for Mr. Micheal Gomez to clarify with Dr. Lake Bells how often CBGs should be checked and how often Dr. Lake Bells would like to be notified for titration of IV insulin drip.       --Will follow patient during hospitalization--  [name] RN, MSN, CDE Diabetes Coordinator Inpatient Glycemic Control Team Team Pager: [phone] (8a-5p)

## 2015-08-06 NOTE — Consult Note (Signed)
Reason for Consult: Acute Kidney Injury, Hypernatremia Referring Physician: [name] MD (CCM)  HPI:  [name] with past medical history significant for hypertension, prediabetes/diet-controlled diabetes and obstructive sleep apnea recently started on CPAP who gets his usual medical care at the [facility] clinic. Brought to the hospital after found down by his fiance (possibly having been down for 4-6 hours) with altered mental status. Subsequent workup showed severe metabolic acidosis, hyperglycemia, positive beta hydroxybutyric acid and hypotension suggestive of diabetic ketoacidosis. He also found to be in acute renal failure with a creatinine of 6.5 (last one in our system from February, 2015 was 1.06). Also significant note was hypernatremia but has continued to rise following management of his hyperglycemia. Further concern was raised this morning when his urine output was noted to be declining.  His daughter and fianc at bedside give a history of an acute GI illness that he suffered 2 weeks ago which he apparently recovered from only to start feeling poorly earlier this week-worse on Wednesday with polyuria/polydipsia and fatigue. They are unaware of any prior history of acute renal failure/chronic kidney disease and there is a questionable remote history of nephrolithiasis. He has not had history of any prostate surgery/bladder surgery. He does not have any history of recurrent nausea, vomiting or diarrhea. There is no reported long-standing NSAID use. He has not had any recent intravenous contrast exposure.  Past Medical History  Diagnosis Date  . Hypertension   . Diabetes mellitus without complication [facility])     Past Surgical History  Procedure Laterality Date  . Appendectomy      History reviewed. No pertinent family history.  Social History:  reports that he has never smoked. He has never used smokeless tobacco. He reports that he does not drink  alcohol or use illicit drugs.  Allergies: No Known Allergies  Medications:  Scheduled: . antiseptic oral rinse  7 mL Mouth Rinse q12n4p  . cefTRIAXone (ROCEPHIN)  IV  2 g Intravenous Daily  . chlorhexidine  15 mL Mouth Rinse BID   BMP Latest Ref Rng 08/06/2015 08/06/2015 08/05/2015  Glucose 65 - 99 mg/dL 200(H) 207(H) 496(H)  BUN 6 - 20 mg/dL 103(H) 113(H) 102(H)  Creatinine 0.61 - 1.24 mg/dL 6.07(H) 5.59(H) 5.51(H)  Sodium 135 - 145 mmol/L 169(HH) 166(HH) 163(HH)  Potassium 3.5 - 5.1 mmol/L 4.7 3.4(L) 3.1(L)  Chloride 101 - 111 mmol/L >130(HH) >130(HH) 128(H)  CO2 22 - 32 mmol/L 23 20(L) 19(L)  Calcium 8.9 - 10.3 mg/dL 8.2(L) 7.6(L) 7.8(L)   CBC Latest Ref Rng 08/06/2015 08/05/2015 08/05/2015  WBC 4.0 - 10.5 K/uL 10.4 12.4(H) -  Hemoglobin 13.0 - 17.0 g/dL 14.1 13.9 18.0(H)  Hematocrit 39.0 - 52.0 % 41.5 42.0 53.0(H)  Platelets 150 - 400 K/uL 128(L) 160 -     Dg Chest 1 View  08/05/2015  CLINICAL DATA:  Altered mental status EXAM: CHEST 1 VIEW COMPARISON:  08/26/2013 FINDINGS: Borderline cardiomegaly. Study is limited by poor inspiration and patient's large body habitus. No gross infiltrate or pulmonary edema. Mild left basilar atelectasis. IMPRESSION: Limited study by patient's large body habitus. No gross infiltrate or pulmonary edema. Cardiomegaly. Left basilar atelectasis. Electronically Signed   By: Lahoma Crocker M.D.   On: 08/05/2015 14:18    Review of Systems  Unable to perform ROS: mental acuity  Constitutional:       Somnolent and lethargic s/p Haldol   Blood pressure 146/65, pulse 78, temperature 99.1 F (37.3 C), temperature source Core (Comment), resp. rate  24, height '5\' 9"'$  (1.753 m), weight 122.471 kg (270 lb), SpO2 95 %. Physical Exam  Nursing note and vitals reviewed. Constitutional: He appears well-developed and well-nourished.  Appears uncomfortable- intermittent groaning in bed  HENT:  Head: Normocephalic and atraumatic.  Nose: Nose normal.  Dry oral mucosa  Eyes:  EOM are normal. Pupils are equal, round, and reactive to light. No scleral icterus.  Neck: Normal range of motion. Neck supple. No tracheal deviation present.  JVP flat  Cardiovascular: Normal rate, regular rhythm and normal heart sounds.   No murmur heard. Respiratory: Effort normal and breath sounds normal. He has no wheezes. He has no rales.  GI: Soft. Bowel sounds are normal. There is tenderness.  LLQ/suprapubic tenderness  Musculoskeletal: He exhibits edema.  Trace LE edema  Neurological:  Somnolent and lethargic (s/p haldol)  Skin: Skin is warm and dry. No rash noted. No erythema.    Assessment/Plan: 1. Acute kidney injury: The most recent lab available to [place] to assess his "baseline renal function" is from February 2015 when his creatinine was normal at 1.06 (EGFR 88). Based on his current history of presentation as well as timeline of events and available database, it appears that his current acute kidney injury is from sustained prerenal state (polyuria/polydipsia from hyperglycemia in the face of ongoing chlorthalidone) likely with evolution to ischemic ATN and possibly aggravation of injury by mild rhabdomyolysis/pigment associated nephropathy. Decreasing urine output noted overnight and urine bag shows amber-colored/concentrated urine. I agree with intravenous fluid support at this time for management of renal insufficiency/electrolyte abnormalities. I will check a urinalysis, urine electrolytes in order for renal ultrasound for further evaluation of his renal insufficiency. At this time, I do not see any acute dialysis indications but will maintain close follow-up and intervene as necessary. 2. Hypernatremia/hyperchloremia: Secondary to polyuria associated with hyperglycemia. Continue hypotonic fluids-D5 water versus switch to 1/4 normal saline. He has a fluid deficit of around 10 L-I will increase his rate of D5 water to 200 mL per hour to provide at least half of that volume (not  accounting at this time for ongoing losses). 3. Diabetic ketoacidosis: Unclear source of provocation and empiric antibiotic instituted with ceftriaxone/vancomycin (Zosyn stopped because of hypernatremia). Ongoing management with insulin/fluids. 4. Hypertension: Blood pressures appear to be under fair control at this time on intravenous fluid resuscitation. With when necessary hydralazine. 5. Metabolic encephalopathy: Secondary to diabetic ketoacidosis/acute delirium/hypernatremia-anticipated to improve his underlying conditions rectified.Marland Kitchen  Micheal Gomez K. 08/06/2015, 11:57 AM

## 2015-08-06 NOTE — Progress Notes (Signed)
Attempted to give 2mg  of haldol, realized IV was leaking, pulled another haldol from pyxis and re-administered

## 2015-08-06 NOTE — Progress Notes (Signed)
eLink Physician-Brief Progress Note Patient Name: Micheal Gomez DOB: 26-Sep-1955 MRN: [id number]   Date of Service  08/06/2015  HPI/Events of Note  Significant hypernatremia noted on this patient with DKA and altered mental status.  Sodium does rise as hyperglycemia is corrected.  His corrected sodium was quite elevated on admit.  Acidemia is improving.  eICU Interventions  D5 1/2 NS changed to D5W Follow electrolytes closely     Intervention Category Minor Interventions: Electrolytes abnormality - evaluation and management  Mauri Brooklyn, P 08/06/2015, 3:46 AM

## 2015-08-06 NOTE — Progress Notes (Addendum)
[place] physician notified about 2300 BMET. Orders received.

## 2015-08-07 ENCOUNTER — Inpatient Hospital Stay (HOSPITAL_COMMUNITY): Payer: Non-veteran care

## 2015-08-07 DIAGNOSIS — E1101 Type 2 diabetes mellitus with hyperosmolarity with coma: Secondary | ICD-10-CM

## 2015-08-07 LAB — BASIC METABOLIC PANEL
ANION GAP: 14 (ref 5–15)
ANION GAP: 13 (ref 5–15)
ANION GAP: 11 (ref 5–15)
Anion gap: 17 — ABNORMAL HIGH (ref 5–15)
Anion gap: 17 — ABNORMAL HIGH (ref 5–15)
Anion gap: 18 — ABNORMAL HIGH (ref 5–15)
BUN: 119 mg/dL — AB (ref 6–20)
BUN: 121 mg/dL — ABNORMAL HIGH (ref 6–20)
BUN: 120 mg/dL — ABNORMAL HIGH (ref 6–20)
BUN: 123 mg/dL — AB (ref 6–20)
BUN: 130 mg/dL — ABNORMAL HIGH (ref 6–20)
BUN: 133 mg/dL — ABNORMAL HIGH (ref 6–20)
CHLORIDE: 124 mmol/L — AB (ref 101–111)
CHLORIDE: 124 mmol/L — AB (ref 101–111)
CHLORIDE: 127 mmol/L — AB (ref 101–111)
CHLORIDE: 128 mmol/L — AB (ref 101–111)
CHLORIDE: 124 mmol/L — AB (ref 101–111)
CHLORIDE: 124 mmol/L — AB (ref 101–111)
CO2: 16 mmol/L — AB (ref 22–32)
CO2: 15 mmol/L — AB (ref 22–32)
CO2: 18 mmol/L — AB (ref 22–32)
CO2: 19 mmol/L — AB (ref 22–32)
CO2: 17 mmol/L — AB (ref 22–32)
CO2: 17 mmol/L — AB (ref 22–32)
CREATININE: 8.15 mg/dL — AB (ref 0.61–1.24)
CREATININE: 8.56 mg/dL — AB (ref 0.61–1.24)
CREATININE: 9.03 mg/dL — AB (ref 0.61–1.24)
CREATININE: 10.18 mg/dL — AB (ref 0.61–1.24)
CREATININE: 10.6 mg/dL — AB (ref 0.61–1.24)
Calcium: 6.9 mg/dL — ABNORMAL LOW (ref 8.9–10.3)
Calcium: 6.5 mg/dL — ABNORMAL LOW (ref 8.9–10.3)
Calcium: 6.8 mg/dL — ABNORMAL LOW (ref 8.9–10.3)
Calcium: 7 mg/dL — ABNORMAL LOW (ref 8.9–10.3)
Calcium: 6.8 mg/dL — ABNORMAL LOW (ref 8.9–10.3)
Calcium: 6.9 mg/dL — ABNORMAL LOW (ref 8.9–10.3)
Creatinine, Ser: 9.54 mg/dL — ABNORMAL HIGH (ref 0.61–1.24)
GFR calc non Af Amer: 6 mL/min — ABNORMAL LOW (ref >60)
GFR calc non Af Amer: 6 mL/min — ABNORMAL LOW (ref >60)
GFR calc non Af Amer: 5 mL/min — ABNORMAL LOW (ref >60)
GFR calc non Af Amer: 6 mL/min — ABNORMAL LOW (ref >60)
GFR calc non Af Amer: 5 mL/min — ABNORMAL LOW (ref >60)
GFR calc non Af Amer: 5 mL/min — ABNORMAL LOW (ref >60)
GFR, EST AFRICAN AMERICAN: 7 mL/min — AB (ref >60)
GFR, EST AFRICAN AMERICAN: 7 mL/min — AB (ref >60)
GFR, EST AFRICAN AMERICAN: 6 mL/min — AB (ref >60)
GFR, EST AFRICAN AMERICAN: 6 mL/min — AB (ref >60)
GFR, EST AFRICAN AMERICAN: 6 mL/min — AB (ref >60)
GFR, EST AFRICAN AMERICAN: 5 mL/min — AB (ref >60)
GLUCOSE: 578 mg/dL — AB (ref 65–99)
GLUCOSE: 577 mg/dL — AB (ref 65–99)
GLUCOSE: 434 mg/dL — AB (ref 65–99)
Glucose, Bld: 337 mg/dL — ABNORMAL HIGH (ref 65–99)
Glucose, Bld: 286 mg/dL — ABNORMAL HIGH (ref 65–99)
Glucose, Bld: 199 mg/dL — ABNORMAL HIGH (ref 65–99)
POTASSIUM: 4.2 mmol/L (ref 3.5–5.1)
POTASSIUM: 4.1 mmol/L (ref 3.5–5.1)
Potassium: 5 mmol/L (ref 3.5–5.1)
Potassium: 4.9 mmol/L (ref 3.5–5.1)
Potassium: 4.5 mmol/L (ref 3.5–5.1)
Potassium: 4.5 mmol/L (ref 3.5–5.1)
SODIUM: 158 mmol/L — AB (ref 135–145)
SODIUM: 159 mmol/L — AB (ref 135–145)
Sodium: 157 mmol/L — ABNORMAL HIGH (ref 135–145)
Sodium: 153 mmol/L — ABNORMAL HIGH (ref 135–145)
Sodium: 158 mmol/L — ABNORMAL HIGH (ref 135–145)
Sodium: 158 mmol/L — ABNORMAL HIGH (ref 135–145)

## 2015-08-07 LAB — GLUCOSE, CAPILLARY
GLUCOSE-CAPILLARY: 184 mg/dL — AB (ref 65–99)
GLUCOSE-CAPILLARY: 256 mg/dL — AB (ref 65–99)
GLUCOSE-CAPILLARY: 256 mg/dL — AB (ref 65–99)
GLUCOSE-CAPILLARY: 257 mg/dL — AB (ref 65–99)
GLUCOSE-CAPILLARY: 440 mg/dL — AB (ref 65–99)
GLUCOSE-CAPILLARY: 444 mg/dL — AB (ref 65–99)
GLUCOSE-CAPILLARY: 445 mg/dL — AB (ref 65–99)
GLUCOSE-CAPILLARY: 454 mg/dL — AB (ref 65–99)
GLUCOSE-CAPILLARY: 455 mg/dL — AB (ref 65–99)
GLUCOSE-CAPILLARY: 457 mg/dL — AB (ref 65–99)
Glucose-Capillary: 442 mg/dL — ABNORMAL HIGH (ref 65–99)
Glucose-Capillary: 388 mg/dL — ABNORMAL HIGH (ref 65–99)
Glucose-Capillary: 357 mg/dL — ABNORMAL HIGH (ref 65–99)
Glucose-Capillary: 299 mg/dL — ABNORMAL HIGH (ref 65–99)
Glucose-Capillary: 287 mg/dL — ABNORMAL HIGH (ref 65–99)
Glucose-Capillary: 295 mg/dL — ABNORMAL HIGH (ref 65–99)
Glucose-Capillary: 220 mg/dL — ABNORMAL HIGH (ref 65–99)
Glucose-Capillary: 202 mg/dL — ABNORMAL HIGH (ref 65–99)
Glucose-Capillary: 169 mg/dL — ABNORMAL HIGH (ref 65–99)

## 2015-08-07 IMAGING — DX DG CHEST 1V PORT
1 series · 1 of 1 positions shown · non-contrast
Comparison: [DATE]

CLINICAL DATA: Central line placement

EXAM:
PORTABLE CHEST 1 VIEW

[chest ap]
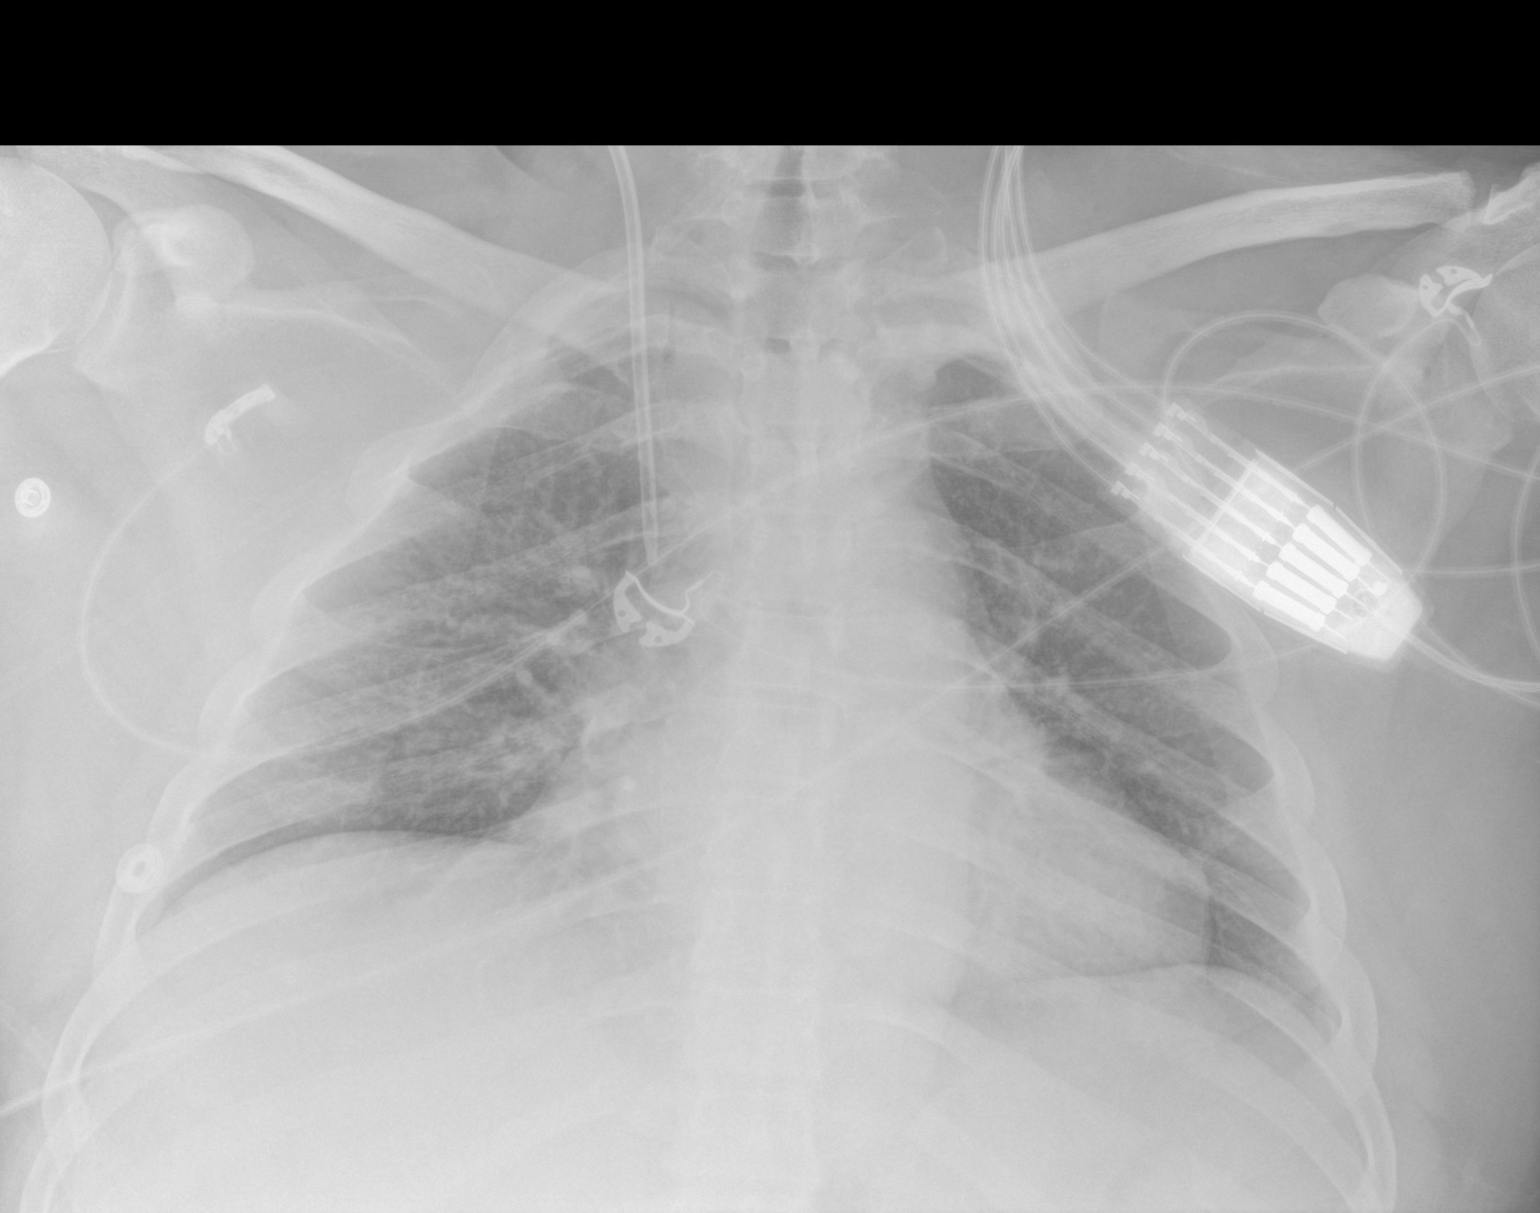

[1 of 1 positions shown; findings below may reference images not displayed]

FINDINGS: Right jugular central venous catheter tip in the mid SVC. No
pneumothorax.

The lungs are clear. No edema or effusion. Negative for pneumonia.
Improvement in bibasilar atelectasis.
IMPRESSION: Satisfactory central venous catheter placement

## 2015-08-07 IMAGING — CR DG ABD PORTABLE 1V
1 series · 1 of 1 positions shown · non-contrast
Comparison: [DATE] chest x-ray

CLINICAL DATA: NG tube placement

EXAM:
PORTABLE ABDOMEN - 1 VIEW

[AP]
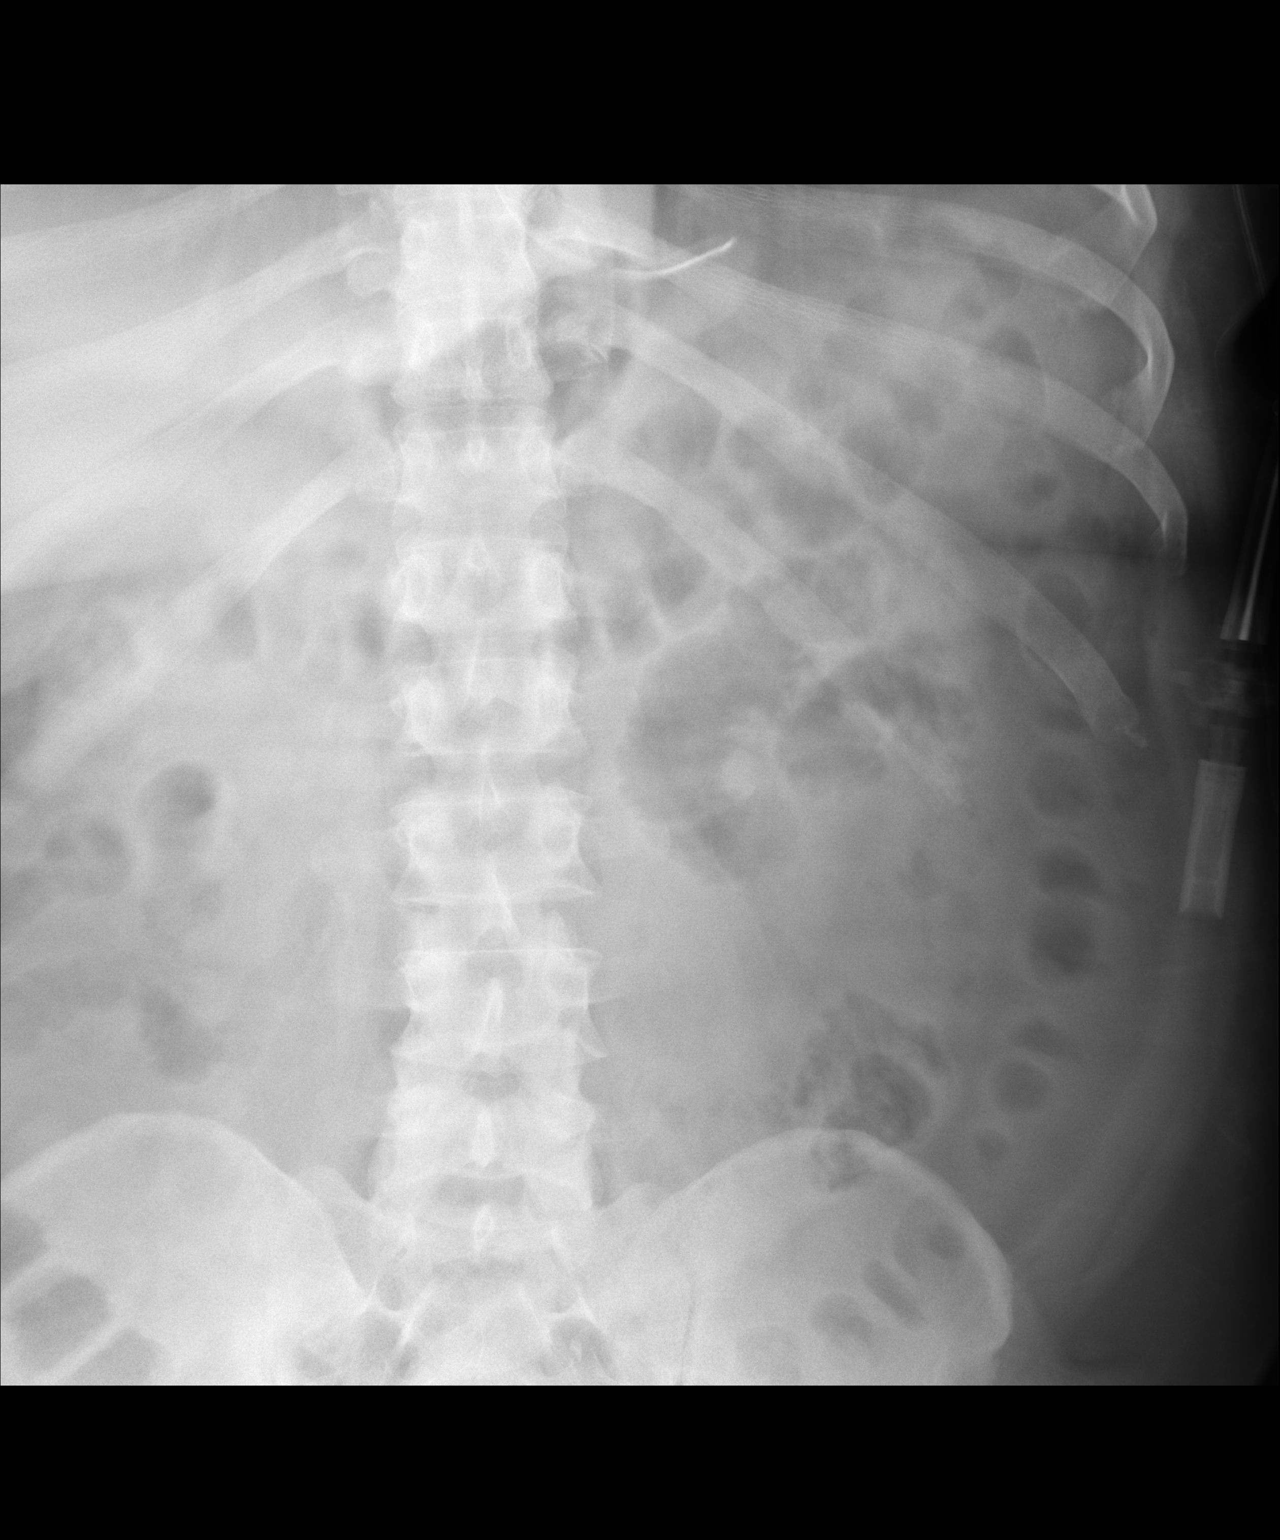

[1 of 1 positions shown; findings below may reference images not displayed]

FINDINGS: Nasogastric tube has been placed, tip overlying the level of the
very proximal stomach. Bowel gas pattern is nonobstructive.
IMPRESSION: Nasogastric tube tip overlying the proximal stomach. Consider
advancing the tube an additional 5-10 cm.

## 2015-08-07 MED ORDER — SODIUM CHLORIDE 0.9 % IV SOLN
1.0000 g | Freq: Once | INTRAVENOUS | Status: AC
  Administered 2015-08-07: 1 g via INTRAVENOUS
  Filled 2015-08-07: qty 10

## 2015-08-07 MED ORDER — HEPARIN SODIUM (PORCINE) 5000 UNIT/ML IJ SOLN
5000.0000 [IU] | Freq: Once | INTRAMUSCULAR | Status: AC
  Administered 2015-08-07: 6000 [IU] via SUBCUTANEOUS
  Filled 2015-08-07: qty 1

## 2015-08-07 MED ORDER — SODIUM CHLORIDE 0.9 % IV SOLN
INTRAVENOUS | Status: DC
  Administered 2015-08-07: 12.4 [IU]/h via INTRAVENOUS
  Administered 2015-08-08: 8.9 [IU]/h via INTRAVENOUS
  Filled 2015-08-07 (×2): qty 2.5

## 2015-08-07 MED ORDER — HEPARIN SODIUM (PORCINE) 10000 UNIT/ML IJ SOLN
10000.0000 [IU] | Freq: Once | INTRAMUSCULAR | Status: DC
  Administered 2015-08-07: 6000 [IU] via SUBCUTANEOUS
  Filled 2015-08-07: qty 1

## 2015-08-07 MED ORDER — FUROSEMIDE 10 MG/ML IJ SOLN
80.0000 mg | Freq: Four times a day (QID) | INTRAMUSCULAR | Status: AC
  Administered 2015-08-07 (×2): 80 mg via INTRAVENOUS
  Filled 2015-08-07 (×2): qty 8

## 2015-08-07 MED ORDER — FREE WATER
200.0000 mL | Freq: Four times a day (QID) | Status: DC
  Administered 2015-08-07: 200 mL

## 2015-08-07 MED ORDER — DEXTROSE 5 % IV SOLN
INTRAVENOUS | Status: DC
  Administered 2015-08-07: 02:00:00 via INTRAVENOUS
  Administered 2015-08-07: 50 mL via INTRAVENOUS
  Administered 2015-08-08 – 2015-08-09 (×2): via INTRAVENOUS
  Administered 2015-08-09: 75 mL via INTRAVENOUS

## 2015-08-07 MED ORDER — FREE WATER
250.0000 mL | Status: DC
  Administered 2015-08-08 – 2015-08-09 (×6): 250 mL

## 2015-08-07 NOTE — Progress Notes (Signed)
Utilization Review Completed.Micheal Gomez T2/10/2015  

## 2015-08-07 NOTE — Progress Notes (Signed)
eLink Physician-Brief Progress Note Patient Name: [name] DOB: 30-Mar-1956 MRN: [id number]   Date of Service  08/07/2015  HPI/Events of Note  Na 157 & corrects to 165 with glucose of 578 on BMP. Hyperchloremia & AG improving. K now 5.0.  eICU Interventions  1. Switch to D5W @ 100cc/hr 2. Continue insulin drip at current rate 3. Change accu-checks to q1hr & RN to notify MD if BG <250. 4. Change BMP to q4hr - next 5am     Intervention Category Major Interventions: Electrolyte abnormality - evaluation and management  Tera Partridge 08/07/2015, 2:04 AM

## 2015-08-07 NOTE — Progress Notes (Signed)
Subjective:  Remains ill in ICU- oliguric- creatinine rising- sugar still high as well - U/S negative for obstruction - urine very concentrated  Objective Vital signs in last 24 hours: Filed Vitals:   08/07/15 0500 08/07/15 0600 08/07/15 0602 08/07/15 0700  BP: 146/43 153/50  150/55  Pulse: 96 89  89  Temp: 99.9 F (37.7 C) 99.7 F (37.6 C)  99.7 F (37.6 C)  TempSrc:      Resp: _0 Height:      Weight:   126.6 kg (279 lb 1.6 oz)   SpO2: 94% 95%  96%   Weight change: 13.201 kg (29 lb 1.6 oz)  Intake/Output Summary (Last 24 hours) at 08/07/15 1123 Last data filed at 08/07/15 1100  Gross per 24 hour  Intake 4335.93 ml  Output    187 ml  Net 4148.93 ml   Assessment/Plan: 1. Acute kidney injury: The most recent lab available to [place] to assess his "baseline renal function" is from February 2015 when his creatinine was normal at 1.06 (EGFR 88). Based on his current history of presentation as well as timeline of events and available database, it appears that his current acute kidney injury is from sustained prerenal state (polyuria/polydipsia from hyperglycemia in the face of ongoing chlorthalidone) likely with evolution to ischemic ATN and possibly aggravation of injury by mild rhabdomyolysis/pigment associated nephropathy.  I agree with intravenous fluid support at this time for management of renal insufficiency/electrolyte abnormalities. Renal ultrasound neg for obstruction.  At this time, I still do not see any acute dialysis indications but will maintain close follow-up - now has HD cath in place 2. Hypernatremia/hyperchloremia: Secondary to polyuria associated with hyperglycemia.  hypotonic fluids-D5 water but is raising his sugar - if we could place NGT vs feeding tube to give free water that would be great 3. Diabetic ketoacidosis: Unclear source of provocation and empiric antibiotic instituted with ceftriaxone/vancomycin (Zosyn stopped because of hypernatremia). Ongoing management  with insulin/fluids.  If can give free water can give less D5 4. Hypertension: Blood pressures appear to be under fair control at this time on intravenous fluid resuscitation. With when necessary hydralazine. 5. Metabolic encephalopathy: Secondary to diabetic ketoacidosis/acute delirium/hypernatremia-anticipated to improve his underlying conditions rectified.Marland Kitchen    Brisa Auth A    Labs: Basic Metabolic Panel:  Recent Labs Lab 08/05/15 1728  08/06/15 0245  08/07/15 0115 08/07/15 0355 08/07/15 0902  NA 156*  < > 166*  < > 157* 153* 158*  K 3.1*  < > 3.4*  < > 5.0 4.9 4.5  CL 118*  < > >130*  < > 124* 124* 127*  CO2 11*  < > 20*  < > 16* 15* 18*  GLUCOSE 1022*  < > 207*  < > 578* 577* 434*  BUN 99*  < > 113*  < > 119* 121* 120*  CREATININE 5.39*  < > 5.59*  < > 8.15* 8.56* 9.54*  CALCIUM 7.9*  < > 7.6*  < > 6.9* 6.5* 6.8*  PHOS 4.8*  --  2.6  --   --   --   --   < > = values in this interval not displayed. Liver Function Tests:  Recent Labs Lab 08/05/15 1052  AST 55*  ALT 42  ALKPHOS 132*  BILITOT 1.6*  PROT 8.2*  ALBUMIN 3.9    Recent Labs Lab 08/05/15 1354  LIPASE 263*   No results for input(s): AMMONIA in the last 168 hours. CBC:  Recent Labs  Lab 08/05/15 1052 08/05/15 1113 08/05/15 1728 08/06/15 0245  WBC 17.3*  --  12.4* 10.4  NEUTROABS 14.9*  --   --   --   HGB 16.2 18.0* 13.9 14.1  HCT 48.4 53.0* 42.0 41.5  MCV 80.8  --  82.2 81.1  PLT 206  --  160 128*   Cardiac Enzymes:  Recent Labs Lab 08/05/15 1052 08/05/15 1354 08/05/15 1724 08/06/15 0245  CKTOTAL 3356*  --   --   --   TROPONINI  --  0.07* 0.12* 0.49*   CBG:  Recent Labs Lab 08/07/15 0319 08/07/15 0503 08/07/15 0514 08/07/15 0600 08/07/15 0749  GLUCAP 445* 454* 440* 442* 388*    Iron Studies: No results for input(s): IRON, TIBC, TRANSFERRIN, FERRITIN in the last 72 hours. Studies/Results: Dg Chest 1 View  08/05/2015  CLINICAL DATA:  Altered mental status EXAM:  CHEST 1 VIEW COMPARISON:  08/26/2013 FINDINGS: Borderline cardiomegaly. Study is limited by poor inspiration and patient's large body habitus. No gross infiltrate or pulmonary edema. Mild left basilar atelectasis. IMPRESSION: Limited study by patient's large body habitus. No gross infiltrate or pulmonary edema. Cardiomegaly. Left basilar atelectasis. Electronically Signed   By: Liviu  Pop M.D.   On: 08/05/2015 14:18   Us Renal  08/06/2015  CLINICAL DATA:  Acute renal failure, sepsis EXAM: RENAL / URINARY TRACT ULTRASOUND COMPLETE COMPARISON:  None. FINDINGS: Right Kidney: Length: 13.4 cm. Echogenicity within normal limits. No mass or hydronephrosis visualized. Left Kidney: Length: 12.7 cm. Echogenicity within normal limits. No mass or hydronephrosis visualized. Bladder: Not evaluated.  The bladder is decompressed by a Foley catheter. IMPRESSION: The kidneys appear normal. Electronically Signed   By: Raymond  Rubner M.D.   On: 08/06/2015 21:01   Dg Chest Port 1 View  08/07/2015  CLINICAL DATA:  Central line placement EXAM: PORTABLE CHEST 1 VIEW COMPARISON:  08/05/2015 FINDINGS: Right jugular central venous catheter tip in the mid SVC. No pneumothorax. The lungs are clear. No edema or effusion. Negative for pneumonia. Improvement in bibasilar atelectasis. IMPRESSION: Satisfactory central venous catheter placement Electronically Signed   By: Charles  Clark M.D.   On: 08/07/2015 11:05   Medications: Infusions: . dextrose 50 mL/hr at 08/07/15 0508  . insulin (NOVOLIN-R) infusion 2.4 Units/hr (08/07/15 1048)    Scheduled Medications: . antiseptic oral rinse  7 mL Mouth Rinse q12n4p  . calcium gluconate  1 g Intravenous Once  . cefTRIAXone (ROCEPHIN)  IV  2 g Intravenous Daily  . chlorhexidine  15 mL Mouth Rinse BID    have reviewed scheduled and prn medications.  Physical Exam: General: sleeping- some head nods Heart: RRR Lungs: mostly clear Abdomen: distended Extremities: dependent  edema Dialysis Access: right IJ temp HD catheter    08/07/2015,11:23 AM  LOS: 2 days          

## 2015-08-07 NOTE — Progress Notes (Signed)
eLink Physician-Brief Progress Note Patient Name: Micheal Gomez DOB: 05-20-1956 MRN: [id number]   Date of Service  08/07/2015  HPI/Events of Note  RN notified of BG ~450. Last corrected Na at 7pm ~165. Insulin drip at 7units/hr. Dextrose IVF at 200cc/hr.  Labs etc being done from opposite arm of Dextrose infusion.  eICU Interventions  1. Repeat BMP at 0100 2. Repeat Blood Glucose at 0100     Intervention Category Major Interventions: Electrolyte abnormality - evaluation and management  Tera Partridge 08/07/2015, 12:07 AM

## 2015-08-07 NOTE — Progress Notes (Signed)
Micheal Gomez 1233 transferred to [name] by [place]. Report was called to [place] RN who will be patient's Therapist, sports. No changes in patient condition.

## 2015-08-07 NOTE — Procedures (Signed)
Hemodialysis Catheter Insertion Procedure Note Micheal Gomez [id number] 08-Apr-1956  Procedure: Insertion of Hemodialysis Catheter Indications: Acute kidney injury  Procedure Details Consent: Risks of procedure as well as the alternatives and risks of each were explained to the (patient/caregiver).  Consent for procedure obtained. Time Out: Verified patient identification, verified procedure, site/side was marked, verified correct patient position, special equipment/implants available, medications/allergies/relevent history reviewed, required imaging and test results available.  Performed  Maximum sterile technique was used including antiseptics, cap, gloves, gown, hand hygiene, mask and sheet. Skin prep: Chlorhexidine; local anesthetic administered A antimicrobial bonded/coated triple lumen catheter was placed in the right internal jugular vein using the Seldinger technique.  Ultrasound was used to verify the patency of the vein and for real time needle guidance.  Evaluation Blood flow good Complications: No apparent complications Patient did tolerate procedure well. Chest X-ray ordered to verify placement.  CXR: pending.  Micheal Gomez 08/07/2015, 10:29 AM

## 2015-08-07 NOTE — Progress Notes (Signed)
PULMONARY / CRITICAL CARE MEDICINE   Name: Micheal Gomez MRN: [id number] DOB: Aug 19, 1955    ADMISSION DATE:  08/05/2015 CONSULTATION DATE:  2/3  REFERRING MD:  Oleta Mouse   CHIEF COMPLAINT:  DKA  BRIEF:   60 y/o male with "pre-diabetes", HTN, OSA admitted on 2/4 with acute encephalopathy and DKA with AKI and severe hypernatremia.   SUBJECTIVE:  Mr. Ullom has remained encephalopathic Corrected sodium OK, Renal function not as good today Oliguric Hyperglycemic, D5 was decreased, Insulin drip restarted  VITAL SIGNS: BP 150/55 mmHg  Pulse 89  Temp(Src) 99.7 F (37.6 C) (Core (Comment))  Resp 21  Ht 5\' 9"  (1.753 m)  Wt 279 lb 1.6 oz (126.6 kg)  BMI 41.20 kg/m2  SpO2 96%  HEMODYNAMICS:    VENTILATOR SETTINGS:    INTAKE / OUTPUT: I/O last 3 completed shifts: In: 7693.3 [I.V.:5933.3; Other:10; IV Piggyback:1750] Out: 686 [Urine:686]  PHYSICAL EXAMINATION: Gen: no acute distress in bed HENT: OP clear, neck supple PULM: CTA B, normal effort CV: RRR, no mgr, trace edema GI: BS+, soft, nontender Derm: no cyanosis or rash Neuro: nods head to voice appropriately but non verbal today, somewhat restless  LABS:  BMET  Recent Labs Lab 08/06/15 1903 08/07/15 0115 08/07/15 0355  NA 159* 157* 153*  K 4.7 5.0 4.9  CL 126* 124* 124*  CO2 14* 16* 15*  BUN 107* 119* 121*  CREATININE 7.40* 8.15* 8.56*  GLUCOSE 452* 578* 577*    Electrolytes  Recent Labs Lab 08/05/15 1728  08/06/15 0245  08/06/15 1903 08/07/15 0115 08/07/15 0355  CALCIUM 7.9*  < > 7.6*  < > 6.8* 6.9* 6.5*  MG 3.6*  --  3.4*  --   --   --   --   PHOS 4.8*  --  2.6  --   --   --   --   < > = values in this interval not displayed.  CBC  Recent Labs Lab 08/05/15 1052 08/05/15 1113 08/05/15 1728 08/06/15 0245  WBC 17.3*  --  12.4* 10.4  HGB 16.2 18.0* 13.9 14.1  HCT 48.4 53.0* 42.0 41.5  PLT 206  --  160 128*    Coag's No results for input(s): APTT, INR in the last 168 hours.  Sepsis  Markers  Recent Labs Lab 08/05/15 1103 08/05/15 1354 08/05/15 1404  LATICACIDVEN 3.93*  --  2.28*  PROCALCITON  --  2.56  --     ABG  Recent Labs Lab 08/05/15 1348 08/05/15 1700  PHART 7.138* 7.251*  PCO2ART 25.8* 28.1*  PO2ART 76.4* 83.2    Liver Enzymes  Recent Labs Lab 08/05/15 1052  AST 55*  ALT 42  ALKPHOS 132*  BILITOT 1.6*  ALBUMIN 3.9    Cardiac Enzymes  Recent Labs Lab 08/05/15 1354 08/05/15 1724 08/06/15 0245  TROPONINI 0.07* 0.12* 0.49*    Glucose  Recent Labs Lab 08/07/15 0116 08/07/15 0319 08/07/15 0503 08/07/15 0514 08/07/15 0600 08/07/15 0749  GLUCAP 444* 445* 454* 440* 442* 388*    Imaging US Renal  08/06/2015  CLINICAL DATA:  Acute renal failure, sepsis EXAM: RENAL / URINARY TRACT ULTRASOUND COMPLETE COMPARISON:  None. FINDINGS: Right Kidney: Length: 13.4 cm. Echogenicity within normal limits. No mass or hydronephrosis visualized. Left Kidney: Length: 12.7 cm. Echogenicity within normal limits. No mass or hydronephrosis visualized. Bladder: Not evaluated.  The bladder is decompressed by a Foley catheter. IMPRESSION: The kidneys appear normal. Electronically Signed   By: Skipper Cliche M.D.   On: 08/06/2015  21:01     STUDIES:  UDS 2/3>>> negative  CULTURES: UC 2.3>>> BCX2 2/3>>>  ANTIBIOTICS: vanc 2/3>>> 2/4 Zosyn 2/3>> 2/4 Ceftriaxone 2/4 >   SIGNIFICANT EVENTS: 2/4 renal consult 2/5 transfer to [facility] for intermittent hemodialysis  LINES/TUBES: 2/5 R IJ hemodialysis catheter insertion  DISCUSSION: 60 year old male w/ "pre-diabetes". Admitted on 2/3 w/ AMS, metabolic acidosis and DKA.  His hypernatremia has slowly corrected (when corrected for hyperglycemia) but we have only been able to do this by administering D5W IV as his mental status limits po intake.  Further complicating matters his renal function has worsened despite receiving over 8 liters IV fluid since admission.  ASSESSMENT / PLAN:  RENAL A:   Gap  acidosis from DKA> resolved Hyperosmolar state> persists Hypernatremia> persists AKI >Possible rhabdomyolysis and volume depletion; now oliguric with BUN > 120 P:   Will need hemodialysis Continue D5W at 50cc/hr Allow to drink water as mental status allows Drop sodium by 9-12 mEq/24 hour period, so follow BMET q4h and adjust D5W to maintain this    ENDOCRINE A:   DKA > resolved Hyperglycemia> persistent, due to D5W P:   Change insulin drip to ICU hyperglycemia protocol Diabetes coordinator consult  PULMONARY A: H/o OSA  P:   Maintain in ICU CPAP qHS Monitor O2 saturation  CARDIOVASCULAR A:  Hypertension Hypovolemia P:  Prn hydralazine Tele   GASTROINTESTINAL A:   No acute  P:   Water today by mouth Diet when mental status better  HEMATOLOGIC A:   Hemoconcentration P:  Monitor for bleeding  INFECTIOUS A:   UTI? doubt P:   Ceftriaxone for today Likely stop antibiotics tomorrow   NEUROLOGIC A:   Acute Encephalopathy in setting of hyperosmolar state P:   Careful titration of insulin/sodium as above Haldol prn   FAMILY  - Updates: girlfriend updated bedside 2/5 along with daughters Ricardo Jericho and Madelaine Bhat by phone  - Inter-disciplinary family meet or Palliative Care meeting due by:  2/7  My cc time 28 minutes  Roselie Awkward, MD [facility] Pager: [phone] Cell: [phone] After 3pm or if no response, call [phone]    08/07/2015, 9:37 AM

## 2015-08-07 NOTE — Progress Notes (Signed)
eLink Physician-Brief Progress Note Patient Name: Micheal Gomez DOB: 03/30/1956 MRN: [id number]   Date of Service  08/07/2015  HPI/Events of Note  BMP now showing Na 153 that corrects to 161 w/ his BG 577. AG continuing to close at 54 now. Chloride & Potassium relatively unchanged.  Change in sodium of 4 over 3 hours despite decreased D5 infusion rate. Current insulin infusion rate of 7 units/hr & D5 100cc/hr. Nurse confirms blood & accu-checks obtained from right arm.   eICU Interventions  1. Change to D5 at 50cc/hr IV 2. Increase Insulin drip to 10units/hr 3. Continue Accu-Checks q1hr  - showing in 450s consistently 4. Continue BMP q4hr - next 8am     Intervention Category Major Interventions: Electrolyte abnormality - evaluation and management  Tera Partridge 08/07/2015, 5:08 AM

## 2015-08-08 DIAGNOSIS — E1311 Other specified diabetes mellitus with ketoacidosis with coma: Principal | ICD-10-CM

## 2015-08-08 DIAGNOSIS — E1111 Type 2 diabetes mellitus with ketoacidosis with coma: Secondary | ICD-10-CM | POA: Diagnosis present

## 2015-08-08 DIAGNOSIS — N179 Acute kidney failure, unspecified: Secondary | ICD-10-CM | POA: Diagnosis present

## 2015-08-08 LAB — GLUCOSE, CAPILLARY
GLUCOSE-CAPILLARY: 289 mg/dL — AB (ref 65–99)
GLUCOSE-CAPILLARY: 158 mg/dL — AB (ref 65–99)
GLUCOSE-CAPILLARY: 142 mg/dL — AB (ref 65–99)
GLUCOSE-CAPILLARY: 156 mg/dL — AB (ref 65–99)
GLUCOSE-CAPILLARY: 157 mg/dL — AB (ref 65–99)
GLUCOSE-CAPILLARY: 152 mg/dL — AB (ref 65–99)
GLUCOSE-CAPILLARY: 156 mg/dL — AB (ref 65–99)
GLUCOSE-CAPILLARY: 175 mg/dL — AB (ref 65–99)
GLUCOSE-CAPILLARY: 180 mg/dL — AB (ref 65–99)
GLUCOSE-CAPILLARY: 144 mg/dL — AB (ref 65–99)
Glucose-Capillary: 141 mg/dL — ABNORMAL HIGH (ref 65–99)
Glucose-Capillary: 144 mg/dL — ABNORMAL HIGH (ref 65–99)
Glucose-Capillary: 152 mg/dL — ABNORMAL HIGH (ref 65–99)
Glucose-Capillary: 155 mg/dL — ABNORMAL HIGH (ref 65–99)
Glucose-Capillary: 156 mg/dL — ABNORMAL HIGH (ref 65–99)
Glucose-Capillary: 159 mg/dL — ABNORMAL HIGH (ref 65–99)
Glucose-Capillary: 175 mg/dL — ABNORMAL HIGH (ref 65–99)
Glucose-Capillary: 183 mg/dL — ABNORMAL HIGH (ref 65–99)
Glucose-Capillary: 184 mg/dL — ABNORMAL HIGH (ref 65–99)

## 2015-08-08 LAB — BASIC METABOLIC PANEL
ANION GAP: 18 — AB (ref 5–15)
ANION GAP: 22 — AB (ref 5–15)
Anion gap: 19 — ABNORMAL HIGH (ref 5–15)
Anion gap: 17 — ABNORMAL HIGH (ref 5–15)
Anion gap: 17 — ABNORMAL HIGH (ref 5–15)
BUN: 135 mg/dL — ABNORMAL HIGH (ref 6–20)
BUN: 139 mg/dL — ABNORMAL HIGH (ref 6–20)
BUN: 141 mg/dL — AB (ref 6–20)
BUN: 144 mg/dL — ABNORMAL HIGH (ref 6–20)
BUN: 105 mg/dL — AB (ref 6–20)
CALCIUM: 6.8 mg/dL — AB (ref 8.9–10.3)
CALCIUM: 6.6 mg/dL — AB (ref 8.9–10.3)
CALCIUM: 6.7 mg/dL — AB (ref 8.9–10.3)
CHLORIDE: 123 mmol/L — AB (ref 101–111)
CHLORIDE: 104 mmol/L (ref 101–111)
CO2: 17 mmol/L — ABNORMAL LOW (ref 22–32)
CO2: 17 mmol/L — AB (ref 22–32)
CO2: 17 mmol/L — ABNORMAL LOW (ref 22–32)
CO2: 15 mmol/L — AB (ref 22–32)
CO2: 21 mmol/L — AB (ref 22–32)
CREATININE: 11.02 mg/dL — AB (ref 0.61–1.24)
CREATININE: 11.35 mg/dL — AB (ref 0.61–1.24)
CREATININE: 10.27 mg/dL — AB (ref 0.61–1.24)
Calcium: 6.8 mg/dL — ABNORMAL LOW (ref 8.9–10.3)
Calcium: 6.7 mg/dL — ABNORMAL LOW (ref 8.9–10.3)
Chloride: 122 mmol/L — ABNORMAL HIGH (ref 101–111)
Chloride: 123 mmol/L — ABNORMAL HIGH (ref 101–111)
Chloride: 117 mmol/L — ABNORMAL HIGH (ref 101–111)
Creatinine, Ser: 12.18 mg/dL — ABNORMAL HIGH (ref 0.61–1.24)
Creatinine, Ser: 12.42 mg/dL — ABNORMAL HIGH (ref 0.61–1.24)
GFR calc Af Amer: 5 mL/min — ABNORMAL LOW (ref >60)
GFR calc Af Amer: 4 mL/min — ABNORMAL LOW (ref >60)
GFR calc non Af Amer: 4 mL/min — ABNORMAL LOW (ref >60)
GFR calc non Af Amer: 4 mL/min — ABNORMAL LOW (ref >60)
GFR calc non Af Amer: 5 mL/min — ABNORMAL LOW (ref >60)
GFR, EST AFRICAN AMERICAN: 5 mL/min — AB (ref >60)
GFR, EST AFRICAN AMERICAN: 5 mL/min — AB (ref >60)
GFR, EST AFRICAN AMERICAN: 6 mL/min — AB (ref >60)
GFR, EST NON AFRICAN AMERICAN: 4 mL/min — AB (ref >60)
GFR, EST NON AFRICAN AMERICAN: 4 mL/min — AB (ref >60)
GLUCOSE: 185 mg/dL — AB (ref 65–99)
GLUCOSE: 192 mg/dL — AB (ref 65–99)
GLUCOSE: 191 mg/dL — AB (ref 65–99)
Glucose, Bld: 188 mg/dL — ABNORMAL HIGH (ref 65–99)
Glucose, Bld: 177 mg/dL — ABNORMAL HIGH (ref 65–99)
POTASSIUM: 4.5 mmol/L (ref 3.5–5.1)
POTASSIUM: 4.2 mmol/L (ref 3.5–5.1)
Potassium: 4.1 mmol/L (ref 3.5–5.1)
Potassium: 4.2 mmol/L (ref 3.5–5.1)
Potassium: 4 mmol/L (ref 3.5–5.1)
SODIUM: 158 mmol/L — AB (ref 135–145)
SODIUM: 157 mmol/L — AB (ref 135–145)
Sodium: 158 mmol/L — ABNORMAL HIGH (ref 135–145)
Sodium: 154 mmol/L — ABNORMAL HIGH (ref 135–145)
Sodium: 142 mmol/L (ref 135–145)

## 2015-08-08 MED ORDER — WHITE PETROLATUM GEL
Status: AC
  Administered 2015-08-08: 0.2
  Filled 2015-08-08: qty 1

## 2015-08-08 MED ORDER — ACETAMINOPHEN 325 MG PO TABS
650.0000 mg | ORAL_TABLET | Freq: Four times a day (QID) | ORAL | Status: DC | PRN
  Administered 2015-08-08 – 2015-08-11 (×3): 650 mg via ORAL
  Filled 2015-08-08 (×3): qty 2

## 2015-08-08 NOTE — Progress Notes (Signed)
Patient transferred to Hemodialysis with HD nurse. Patient remains on the monitor and insulin gtt. Report handed off to HD nurse.

## 2015-08-08 NOTE — Progress Notes (Signed)
S: Awake and alert.  Denies any CO O:BP 169/90 mmHg  Pulse 92  Temp(Src) 97.4 F (36.3 C) (Oral)  Resp 17  Ht 5\' 9"  (1.753 m)  Wt 125.9 kg (277 lb 9 oz)  BMI 40.97 kg/m2  SpO2 97%  Intake/Output Summary (Last 24 hours) at 08/08/15 0825 Last data filed at 08/08/15 0500  Gross per 24 hour  Intake 1455.86 ml  Output    207 ml  Net 1248.86 ml   Weight change: -4.2 kg (-9 lb 4.2 oz) [id number] and alert CVS:RRR Resp: clear Abd:+ BS NTND No HSM Ext: no edema NEURO: CNI Ox3 no asterixis   . antiseptic oral rinse  7 mL Mouth Rinse q12n4p  . cefTRIAXone (ROCEPHIN)  IV  2 g Intravenous Daily  . chlorhexidine  15 mL Mouth Rinse BID  . free water  250 mL Per Tube Q4H   US Renal  08/06/2015  CLINICAL DATA:  Acute renal failure, sepsis EXAM: RENAL / URINARY TRACT ULTRASOUND COMPLETE COMPARISON:  None. FINDINGS: Right Kidney: Length: 13.4 cm. Echogenicity within normal limits. No mass or hydronephrosis visualized. Left Kidney: Length: 12.7 cm. Echogenicity within normal limits. No mass or hydronephrosis visualized. Bladder: Not evaluated.  The bladder is decompressed by a Foley catheter. IMPRESSION: The kidneys appear normal. Electronically Signed   By: Skipper Cliche M.D.   On: 08/06/2015 21:01   Dg Chest Port 1 View  08/07/2015  CLINICAL DATA:  Central line placement EXAM: PORTABLE CHEST 1 VIEW COMPARISON:  08/05/2015 FINDINGS: Right jugular central venous catheter tip in the mid SVC. No pneumothorax. The lungs are clear. No edema or effusion. Negative for pneumonia. Improvement in bibasilar atelectasis. IMPRESSION: Satisfactory central venous catheter placement Electronically Signed   By: Franchot Gallo M.D.   On: 08/07/2015 11:05   Dg Abd Portable 1v  08/07/2015  CLINICAL DATA:  NG tube placement EXAM: PORTABLE ABDOMEN - 1 VIEW COMPARISON:  08/07/2015 chest x-ray FINDINGS: Nasogastric tube has been placed, tip overlying the level of the very proximal stomach. Bowel gas pattern is  nonobstructive. IMPRESSION: Nasogastric tube tip overlying the proximal stomach. Consider advancing the tube an additional 5-10 cm. Electronically Signed   By: Nolon Nations M.D.   On: 08/07/2015 18:07   BMET    Component Value Date/Time   NA 157* 08/08/2015 0445   K 4.2 08/08/2015 0445   CL 123* 08/08/2015 0445   CO2 17* 08/08/2015 0445   GLUCOSE 177* 08/08/2015 0445   BUN 139* 08/08/2015 0445   CREATININE 11.35* 08/08/2015 0445   CALCIUM 6.6* 08/08/2015 0445   GFRNONAA 4* 08/08/2015 0445   GFRAA 5* 08/08/2015 0445   CBC    Component Value Date/Time   WBC 10.4 08/06/2015 0245   RBC 5.12 08/06/2015 0245   HGB 14.1 08/06/2015 0245   HCT 41.5 08/06/2015 0245   PLT 128* 08/06/2015 0245   MCV 81.1 08/06/2015 0245   MCH 27.5 08/06/2015 0245   MCHC 34.0 08/06/2015 0245   RDW 14.9 08/06/2015 0245   LYMPHSABS 1.2 08/05/2015 1052   MONOABS 1.2* 08/05/2015 1052   EOSABS 0.0 08/05/2015 1052   BASOSABS 0.0 08/05/2015 1052     Assessment:  1. Presumably ARF in face of DKA with ATN though he has microhematuria of ? Significance. 2. DKA 3. HTN 4. Hypernatremia sec volume depletion  Plan: 1.  Will plan HD today 2.  Need to get records from [place] 3.  Because of microhematuria and ARF will check serologies   Noraa Pickeral  T

## 2015-08-08 NOTE — Evaluation (Signed)
Clinical/Bedside Swallow Evaluation Patient Details  Name: Micheal Gomez MRN: [id number] Date of Birth: 1955-12-11  Today's Date: 08/08/2015 Time: SLP Start Time (ACUTE ONLY): 1110 SLP Stop Time (ACUTE ONLY): 1123 SLP Time Calculation (min) (ACUTE ONLY): 13 min  Past Medical History:  Past Medical History  Diagnosis Date  . Hypertension   . Diabetes mellitus without complication [facility])    Past Surgical History:  Past Surgical History  Procedure Laterality Date  . Appendectomy     HPI:  60 y/o male with "pre-diabetes", HTN, OSA admitted on 2/4 with acute encephalopathy and DKA with AKI and severe hypernatremia.   Assessment / Plan / Recommendation Clinical Impression  Pt demonstrates impaired swallow function, likely due to the presence of an NG tube which is causing odynophagia and may also impact airway protection during the swallow. Pt struggles to coordinate breathing and swallowing due to occluded nasal passages with oral inspiration just following swallow. Pt intermittently coughs with all textures. Fully expect pt to be able to swallow at least modified textures when NG is out, but until then, recommend ongoing NPO status. SLP will follow for readiness.     Aspiration Risk  Moderate aspiration risk    Diet Recommendation NPO   Medication Administration: Via alternative means Postural Changes: Seated upright at 90 degrees    Other  Recommendations     Follow up Recommendations       Frequency and Duration min 2x/week  2 weeks       Prognosis Prognosis for Safe Diet Advancement: Good      Swallow Study   General HPI: 60 y/o male with "pre-diabetes", HTN, OSA admitted on 2/4 with acute encephalopathy and DKA with AKI and severe hypernatremia. Type of Study: Bedside Swallow Evaluation Diet Prior to this Study: NPO Temperature Spikes Noted: No Respiratory Status: Room air History of Recent Intubation: No Behavior/Cognition: Alert;Cooperative;Pleasant mood Oral  Cavity Assessment: Dry Oral Care Completed by SLP: Yes Oral Cavity - Dentition: Adequate natural dentition Vision: Functional for self-feeding Self-Feeding Abilities: Able to feed self Patient Positioning: Upright in bed Volitional Cough: Strong Volitional Swallow: Able to elicit    Oral/Motor/Sensory Function Overall Oral Motor/Sensory Function: Within functional limits   Ice Chips     Thin Liquid Thin Liquid: Impaired Presentation: Cup;Straw;Self Fed Pharyngeal  Phase Impairments: Suspected delayed Swallow;Cough - Immediate    Nectar Thick Nectar Thick Liquid: Not tested   Honey Thick Honey Thick Liquid: Not tested   Puree Puree: Impaired Presentation: Spoon Pharyngeal Phase Impairments: Suspected delayed Swallow;Cough - Immediate   Solid   GO   Solid: Not tested       [name], MA CCC-SLP [phone]  Loye Reininger, Katherene Ponto 08/08/2015,12:28 PM

## 2015-08-08 NOTE — Progress Notes (Signed)
Notified MD Sood in regards to patient not having NG tube anymore. Removed by previous shift RN. Speech therapy did see patient today, wanted to keep NPO due to coughing during swallow evaluation with NG present. MD Sood said hold free water for now and remain NPO and this will be readdressed in AM. Will continue to monitor and assess.

## 2015-08-08 NOTE — Care Management Note (Signed)
Case Management Note  Patient Details  Name: Micheal Gomez MRN: [id number] Date of Birth: 1955-07-20  Subjective/Objective:     Pt admitted with DKA, Creatinine >11, hypernatremic.  Pt will have HD today due to AKI             Action/Plan:  Pt is independent from home alone, finance can provide support/supervision post discharge.   Pt has PCP Dr Milinda Pointer with [facility] and gets his medications filled at their pharmacy.  CM contacted transfer coordinator to inform of admit, CM also left voice mail for [facility] [phone] informing of admit.     Expected Discharge Date:   (UNKNOWN)               Expected Discharge Plan:  Home/Self Care  In-House Referral:     Discharge planning Services  CM Consult  Post Acute Care Choice:    Choice offered to:     DME Arranged:    DME Agency:     HH Arranged:    HH Agency:     Status of Service:  In process, will continue to follow  Medicare Important Message Given:    Date Medicare IM Given:    Medicare IM give by:    Date Additional Medicare IM Given:    Additional Medicare Important Message give by:     If discussed at [place] of Stay Meetings, dates discussed:    Additional Comments:  Maryclare Labrador, RN 08/08/2015, 1:51 PM

## 2015-08-08 NOTE — Progress Notes (Signed)
PULMONARY / CRITICAL CARE MEDICINE   Name: Micheal Gomez MRN: [id number] DOB: 1955-07-22    ADMISSION DATE:  08/05/2015 CONSULTATION DATE:  2/3  REFERRING MD:  Oleta Mouse   CHIEF COMPLAINT:  DKA  BRIEF:   60 y/o male with "pre-diabetes", HTN, OSA admitted on 2/4 with acute encephalopathy and DKA with AKI and severe hypernatremia.   SUBJECTIVE:  Feeling better overnight.  Coming down on insulin requirement.  Renal function not improving. About 15-20cc/hr urine.  Hypernatremia remains.   VITAL SIGNS: BP 169/90 mmHg  Pulse 92  Temp(Src) 99.1 F (37.3 C) (Axillary)  Resp 17  Ht 5\' 9"  (1.753 m)  Wt 277 lb 9 oz (125.9 kg)  BMI 40.97 kg/m2  SpO2 97%  HEMODYNAMICS:    VENTILATOR SETTINGS: Vent Mode:  [-]  FiO2 (%):  [21 %] 21 %  INTAKE / OUTPUT: I/O last 3 completed shifts: In: 3300.1 [I.V.:2900.1; NG/GT:250; IV Piggyback:150] Out: 247 [Urine:245; Stool:2]  PHYSICAL EXAMINATION: Gen: no acute distress HENT: OP clear, neck supple PULM: Mild crackles in the left base, normal effort CV: RRR, no mgr, trace edema GI: BS+, soft, nontender Derm: no cyanosis or rash Neuro: AAOx3, follows commands; moves all four ext   LABS:  BMET  Recent Labs Lab 08/07/15 2120 08/08/15 0140 08/08/15 0445  NA 159* 158* 157*  K 4.1 4.1 4.2  CL 124* 122* 123*  CO2 17* 17* 17*  BUN 133* 135* 139*  CREATININE 10.60* 11.02* 11.35*  GLUCOSE 199* 188* 177*    Electrolytes  Recent Labs Lab 08/05/15 1728  08/06/15 0245  08/07/15 2120 08/08/15 0140 08/08/15 0445  CALCIUM 7.9*  < > 7.6*  < > 6.9* 6.8* 6.6*  MG 3.6*  --  3.4*  --   --   --   --   PHOS 4.8*  --  2.6  --   --   --   --   < > = values in this interval not displayed.  CBC  Recent Labs Lab 08/05/15 1052 08/05/15 1113 08/05/15 1728 08/06/15 0245  WBC 17.3*  --  12.4* 10.4  HGB 16.2 18.0* 13.9 14.1  HCT 48.4 53.0* 42.0 41.5  PLT 206  --  160 128*    Coag's No results for input(s): APTT, INR in the last 168  hours.  Sepsis Markers  Recent Labs Lab 08/05/15 1103 08/05/15 1354 08/05/15 1404  LATICACIDVEN 3.93*  --  2.28*  PROCALCITON  --  2.56  --     ABG  Recent Labs Lab 08/05/15 1348 08/05/15 1700  PHART 7.138* 7.251*  PCO2ART 25.8* 28.1*  PO2ART 76.4* 83.2    Liver Enzymes  Recent Labs Lab 08/05/15 1052  AST 55*  ALT 42  ALKPHOS 132*  BILITOT 1.6*  ALBUMIN 3.9    Cardiac Enzymes  Recent Labs Lab 08/05/15 1354 08/05/15 1724 08/06/15 0245  TROPONINI 0.07* 0.12* 0.49*    Glucose  Recent Labs Lab 08/07/15 2219 08/07/15 2325 08/08/15 0023 08/08/15 0136 08/08/15 0240 08/08/15 0339  GLUCAP 158* 142* 175* 156* 157* 156*    Imaging Dg Chest Port 1 View  08/07/2015  CLINICAL DATA:  Central line placement EXAM: PORTABLE CHEST 1 VIEW COMPARISON:  08/05/2015 FINDINGS: Right jugular central venous catheter tip in the mid SVC. No pneumothorax. The lungs are clear. No edema or effusion. Negative for pneumonia. Improvement in bibasilar atelectasis. IMPRESSION: Satisfactory central venous catheter placement Electronically Signed   By: Franchot Gallo M.D.   On: 08/07/2015 11:05   Dg  Abd Portable 1v  08/07/2015  CLINICAL DATA:  NG tube placement EXAM: PORTABLE ABDOMEN - 1 VIEW COMPARISON:  08/07/2015 chest x-ray FINDINGS: Nasogastric tube has been placed, tip overlying the level of the very proximal stomach. Bowel gas pattern is nonobstructive. IMPRESSION: Nasogastric tube tip overlying the proximal stomach. Consider advancing the tube an additional 5-10 cm. Electronically Signed   By: Nolon Nations M.D.   On: 08/07/2015 18:07     STUDIES:  UDS 2/3>>> Negative.   CULTURES: UC 2.3>>>No growth BCX2 2/3>>> No Growth x 2 days.   ANTIBIOTICS: vanc 2/3>>> 2/4 Zosyn 2/3>> 2/4 Ceftriaxone 2/4 >>  SIGNIFICANT EVENTS:   LINES/TUBES: HD Catheter 02/05 >  Foley Catheter  02/3 >  DISCUSSION: 60 year old male w/ "pre-diabetes". Admitted on 2/3 w/ AMS, metabolic  acidosis and DKA.  Overnight his glucose continues to improve. Sodium getting worse.  Mental status clearing. Rhabdomyalysis.   ASSESSMENT / PLAN:  RENAL A:   DKA causing anion gap acidosis, improving AKI >rhabdomyolysis and volume depletion; now oliguric Hypernatremia Hyperosmolar state (on admission 457 mOsm/kg) P:   Renal consult - appreciate recommendations.  D5W at 75cc/hr with Free water 250cc q4hr.  Volume removal per Renal.  Allow to drink water.  Target glucose 150 - 200 Drop sodium by 9-12 mEq/24 hour period, so follow BMET q6h and adjust D5W to maintain this Hemodialysis today.  Strict I/O's.  Daily Weights.   ENDOCRINE A:   DKA Hyperglycemia P:   Continue insulin drip today to target glucose 150-200 Diabetes coordinator  PULMONARY A: H/o OSA  P:   Maintain in ICU CPAP qHS Monitor O2 saturation    CARDIOVASCULAR A:  Hypertension Hypovolemia P:  Prn hydralazine Tele Monitor fluid status with elevated BP's.    GASTROINTESTINAL A:   No acute  P:   Water by mouth SLP and diet per recs today.   HEMATOLOGIC A:   Hemoconcentration P:  Monitor for bleeding  INFECTIOUS A:   UTI? doubt P:   Ceftriaxone stop on 2/7.    NEUROLOGIC A:   Acute Encephalopathy in setting of hyperosmolar state, improving P:   Careful titration of insulin/sodium as above Mental status improving.    FAMILY  - Updates: girlfriend updated bedside 2/4  - Inter-disciplinary family meet or Palliative Care meeting due by:  2/7  Paula Compton, MD Family Medicine - PGY2     08/08/2015, 7:56 AM

## 2015-08-08 NOTE — Progress Notes (Signed)
Notified Elink RN Ben to pass along BMET results to MD [employer]. Creatinine is trending upwards, 10.6 and also Na 159. Patient is receiving free water flushes as well. Pt is producing about 15-30cc/hr, starting to change from tea colored to yellow. Renal is following and also aware.

## 2015-08-08 NOTE — Progress Notes (Signed)
Notified Elink RN Ben to pass along BMET results to MD [employer]. Creatinine is trending up, 11.02, and Na 158. Anion gap is still not closed. Patient is receiving insulin drip, free water flushes-250 cc q4 for Na, and UO is between 15-30 cc. Will continue to monitor and assess.

## 2015-08-08 NOTE — Progress Notes (Signed)
Handoff communication received from [place], [place].

## 2015-08-09 DIAGNOSIS — Z9989 Dependence on other enabling machines and devices: Secondary | ICD-10-CM

## 2015-08-09 DIAGNOSIS — E662 Morbid (severe) obesity with alveolar hypoventilation: Secondary | ICD-10-CM | POA: Diagnosis present

## 2015-08-09 DIAGNOSIS — G4733 Obstructive sleep apnea (adult) (pediatric): Secondary | ICD-10-CM

## 2015-08-09 DIAGNOSIS — G9340 Encephalopathy, unspecified: Secondary | ICD-10-CM | POA: Diagnosis present

## 2015-08-09 DIAGNOSIS — M6282 Rhabdomyolysis: Secondary | ICD-10-CM | POA: Diagnosis present

## 2015-08-09 DIAGNOSIS — N186 End stage renal disease: Secondary | ICD-10-CM | POA: Diagnosis present

## 2015-08-09 DIAGNOSIS — Z992 Dependence on renal dialysis: Secondary | ICD-10-CM

## 2015-08-09 LAB — CBC
HEMATOCRIT: 30.9 % — AB (ref 39.0–52.0)
HEMOGLOBIN: 10.4 g/dL — AB (ref 13.0–17.0)
MCH: 26.7 pg (ref 26.0–34.0)
MCHC: 33.7 g/dL (ref 30.0–36.0)
MCV: 79.4 fL (ref 78.0–100.0)
Platelets: 44 10*3/uL — ABNORMAL LOW (ref 150–400)
RBC: 3.89 MIL/uL — AB (ref 4.22–5.81)
RDW: 15.3 % (ref 11.5–15.5)
WBC: 6.3 10*3/uL (ref 4.0–10.5)

## 2015-08-09 LAB — HEPATITIS B SURFACE ANTIBODY,QUALITATIVE: Hep B S Ab: NONREACTIVE

## 2015-08-09 LAB — BASIC METABOLIC PANEL
ANION GAP: 14 (ref 5–15)
ANION GAP: 16 — AB (ref 5–15)
Anion gap: 17 — ABNORMAL HIGH (ref 5–15)
Anion gap: 15 (ref 5–15)
Anion gap: 16 — ABNORMAL HIGH (ref 5–15)
BUN: 109 mg/dL — ABNORMAL HIGH (ref 6–20)
BUN: 113 mg/dL — ABNORMAL HIGH (ref 6–20)
BUN: 106 mg/dL — AB (ref 6–20)
BUN: 116 mg/dL — ABNORMAL HIGH (ref 6–20)
BUN: 66 mg/dL — ABNORMAL HIGH (ref 6–20)
CALCIUM: 6.6 mg/dL — AB (ref 8.9–10.3)
CALCIUM: 7.1 mg/dL — AB (ref 8.9–10.3)
CALCIUM: 6.8 mg/dL — AB (ref 8.9–10.3)
CHLORIDE: 108 mmol/L (ref 101–111)
CHLORIDE: 109 mmol/L (ref 101–111)
CHLORIDE: 108 mmol/L (ref 101–111)
CO2: 21 mmol/L — ABNORMAL LOW (ref 22–32)
CO2: 18 mmol/L — ABNORMAL LOW (ref 22–32)
CO2: 20 mmol/L — AB (ref 22–32)
CO2: 25 mmol/L (ref 22–32)
CO2: 20 mmol/L — AB (ref 22–32)
CREATININE: 10.65 mg/dL — AB (ref 0.61–1.24)
Calcium: 6.8 mg/dL — ABNORMAL LOW (ref 8.9–10.3)
Calcium: 6.7 mg/dL — ABNORMAL LOW (ref 8.9–10.3)
Chloride: 109 mmol/L (ref 101–111)
Chloride: 101 mmol/L (ref 101–111)
Creatinine, Ser: 10.81 mg/dL — ABNORMAL HIGH (ref 0.61–1.24)
Creatinine, Ser: 11.43 mg/dL — ABNORMAL HIGH (ref 0.61–1.24)
Creatinine, Ser: 12.27 mg/dL — ABNORMAL HIGH (ref 0.61–1.24)
Creatinine, Ser: 8.15 mg/dL — ABNORMAL HIGH (ref 0.61–1.24)
GFR calc Af Amer: 5 mL/min — ABNORMAL LOW (ref >60)
GFR calc Af Amer: 4 mL/min — ABNORMAL LOW (ref >60)
GFR calc Af Amer: 7 mL/min — ABNORMAL LOW (ref >60)
GFR calc non Af Amer: 5 mL/min — ABNORMAL LOW (ref >60)
GFR calc non Af Amer: 4 mL/min — ABNORMAL LOW (ref >60)
GFR calc non Af Amer: 4 mL/min — ABNORMAL LOW (ref >60)
GFR calc non Af Amer: 4 mL/min — ABNORMAL LOW (ref >60)
GFR calc non Af Amer: 6 mL/min — ABNORMAL LOW (ref >60)
GFR, EST AFRICAN AMERICAN: 5 mL/min — AB (ref >60)
GFR, EST AFRICAN AMERICAN: 5 mL/min — AB (ref >60)
GLUCOSE: 161 mg/dL — AB (ref 65–99)
GLUCOSE: 138 mg/dL — AB (ref 65–99)
Glucose, Bld: 180 mg/dL — ABNORMAL HIGH (ref 65–99)
Glucose, Bld: 162 mg/dL — ABNORMAL HIGH (ref 65–99)
Glucose, Bld: 178 mg/dL — ABNORMAL HIGH (ref 65–99)
POTASSIUM: 4.1 mmol/L (ref 3.5–5.1)
POTASSIUM: 3.9 mmol/L (ref 3.5–5.1)
Potassium: 4.1 mmol/L (ref 3.5–5.1)
Potassium: 4 mmol/L (ref 3.5–5.1)
Potassium: 3.9 mmol/L (ref 3.5–5.1)
SODIUM: 144 mmol/L (ref 135–145)
SODIUM: 146 mmol/L — AB (ref 135–145)
SODIUM: 142 mmol/L (ref 135–145)
Sodium: 143 mmol/L (ref 135–145)
Sodium: 142 mmol/L (ref 135–145)

## 2015-08-09 LAB — GLOMERULAR BASEMENT MEMBRANE ANTIBODIES: GBM Ab: 5 units (ref 0–20)

## 2015-08-09 LAB — CBC WITH DIFFERENTIAL/PLATELET
BASOS PCT: 0 %
Basophils Absolute: 0 10*3/uL (ref 0.0–0.1)
EOS PCT: 1 %
Eosinophils Absolute: 0.1 10*3/uL (ref 0.0–0.7)
HCT: 30.6 % — ABNORMAL LOW (ref 39.0–52.0)
Hemoglobin: 10 g/dL — ABNORMAL LOW (ref 13.0–17.0)
LYMPHS ABS: 1.1 10*3/uL (ref 0.7–4.0)
Lymphocytes Relative: 18 %
MCH: 25.9 pg — AB (ref 26.0–34.0)
MCHC: 32.7 g/dL (ref 30.0–36.0)
MCV: 79.3 fL (ref 78.0–100.0)
MONO ABS: 0.4 10*3/uL (ref 0.1–1.0)
Monocytes Relative: 7 %
NEUTROS ABS: 4.4 10*3/uL (ref 1.7–7.7)
Neutrophils Relative %: 74 %
PLATELETS: 42 10*3/uL — AB (ref 150–400)
RBC: 3.86 MIL/uL — ABNORMAL LOW (ref 4.22–5.81)
RDW: 15.2 % (ref 11.5–15.5)
WBC: 6 10*3/uL (ref 4.0–10.5)

## 2015-08-09 LAB — GLUCOSE, CAPILLARY
GLUCOSE-CAPILLARY: 147 mg/dL — AB (ref 65–99)
GLUCOSE-CAPILLARY: 162 mg/dL — AB (ref 65–99)
GLUCOSE-CAPILLARY: 158 mg/dL — AB (ref 65–99)
GLUCOSE-CAPILLARY: 161 mg/dL — AB (ref 65–99)
GLUCOSE-CAPILLARY: 134 mg/dL — AB (ref 65–99)
GLUCOSE-CAPILLARY: 156 mg/dL — AB (ref 65–99)
GLUCOSE-CAPILLARY: 172 mg/dL — AB (ref 65–99)
GLUCOSE-CAPILLARY: 175 mg/dL — AB (ref 65–99)
GLUCOSE-CAPILLARY: 165 mg/dL — AB (ref 65–99)
GLUCOSE-CAPILLARY: 181 mg/dL — AB (ref 65–99)
GLUCOSE-CAPILLARY: 155 mg/dL — AB (ref 65–99)
GLUCOSE-CAPILLARY: 152 mg/dL — AB (ref 65–99)
Glucose-Capillary: 143 mg/dL — ABNORMAL HIGH (ref 65–99)
Glucose-Capillary: 145 mg/dL — ABNORMAL HIGH (ref 65–99)
Glucose-Capillary: 181 mg/dL — ABNORMAL HIGH (ref 65–99)
Glucose-Capillary: 136 mg/dL — ABNORMAL HIGH (ref 65–99)
Glucose-Capillary: 139 mg/dL — ABNORMAL HIGH (ref 65–99)
Glucose-Capillary: 166 mg/dL — ABNORMAL HIGH (ref 65–99)
Glucose-Capillary: 168 mg/dL — ABNORMAL HIGH (ref 65–99)
Glucose-Capillary: 122 mg/dL — ABNORMAL HIGH (ref 65–99)
Glucose-Capillary: 123 mg/dL — ABNORMAL HIGH (ref 65–99)

## 2015-08-09 LAB — FOLATE: Folate: 15.7 ng/mL (ref >5.9)

## 2015-08-09 LAB — HEMOGLOBIN A1C
Hgb A1c MFr Bld: 15.8 % — ABNORMAL HIGH (ref 4.8–5.6)
MEAN PLASMA GLUCOSE: 407 mg/dL

## 2015-08-09 LAB — RETICULOCYTES
RBC.: 3.86 MIL/uL — ABNORMAL LOW (ref 4.22–5.81)
Retic Ct Pct: <0.4 % — ABNORMAL LOW (ref 0.4–3.1)

## 2015-08-09 LAB — IRON AND TIBC
Iron: 71 ug/dL (ref 45–182)
SATURATION RATIOS: 44 % — AB (ref 17.9–39.5)
TIBC: 161 ug/dL — AB (ref 250–450)
UIBC: 90 ug/dL

## 2015-08-09 LAB — C4 COMPLEMENT: COMPLEMENT C4, BODY FLUID: 35 mg/dL (ref 14–44)

## 2015-08-09 LAB — C3 COMPLEMENT: C3 COMPLEMENT: 151 mg/dL (ref 82–167)

## 2015-08-09 LAB — LACTATE DEHYDROGENASE: LDH: 461 U/L — ABNORMAL HIGH (ref 98–192)

## 2015-08-09 LAB — MPO/PR-3 (ANCA) ANTIBODIES: Myeloperoxidase Abs: <9 U/mL (ref 0.0–9.0)

## 2015-08-09 LAB — HEPATITIS B CORE ANTIBODY, TOTAL: Hep B Core Total Ab: NEGATIVE

## 2015-08-09 LAB — VITAMIN B12: VITAMIN B 12: 814 pg/mL (ref 180–914)

## 2015-08-09 LAB — HEPATITIS B SURFACE ANTIGEN: Hepatitis B Surface Ag: NEGATIVE

## 2015-08-09 LAB — ANTINUCLEAR ANTIBODIES, IFA: ANTINUCLEAR ANTIBODIES, IFA: NEGATIVE

## 2015-08-09 LAB — FERRITIN: FERRITIN: 304 ng/mL (ref 24–336)

## 2015-08-09 MED ORDER — INSULIN GLARGINE 100 UNIT/ML ~~LOC~~ SOLN
10.0000 [IU] | Freq: Every day | SUBCUTANEOUS | Status: DC
  Administered 2015-08-09 – 2015-08-11 (×3): 10 [IU] via SUBCUTANEOUS
  Filled 2015-08-09 (×3): qty 0.1

## 2015-08-09 MED ORDER — ACETAMINOPHEN 650 MG RE SUPP
650.0000 mg | Freq: Four times a day (QID) | RECTAL | Status: DC | PRN
  Administered 2015-08-09: 650 mg via RECTAL
  Filled 2015-08-09: qty 1

## 2015-08-09 MED ORDER — SODIUM CHLORIDE 0.9 % IV SOLN
INTRAVENOUS | Status: DC
  Filled 2015-08-09: qty 2.5

## 2015-08-09 MED ORDER — INSULIN ASPART 100 UNIT/ML ~~LOC~~ SOLN
0.0000 [IU] | SUBCUTANEOUS | Status: DC
  Administered 2015-08-09: 3 [IU] via SUBCUTANEOUS
  Administered 2015-08-10: 2 [IU] via SUBCUTANEOUS
  Administered 2015-08-10 (×2): 3 [IU] via SUBCUTANEOUS

## 2015-08-09 MED ORDER — PANTOPRAZOLE SODIUM 40 MG IV SOLR
40.0000 mg | INTRAVENOUS | Status: DC
  Administered 2015-08-09: 40 mg via INTRAVENOUS
  Filled 2015-08-09 (×2): qty 40

## 2015-08-09 NOTE — Progress Notes (Signed)
[facility] TEAM 1 - Stepdown/ICU TEAM Progress Note  Micheal Gomez [id number] DOB: [birth year] DOA: 08/05/2015 PCP: No primary care provider on file.  Admit HPI / Brief Narrative: 60 year old BM, PMHx HTN and DM (not on treatment), OSA on CPAP.   Was in usual state of health until about 2d prior to admission when he began to c/o light-headedness. He had been contributing this to his CPAP machine. On 2/3 he was found by his fiancee on the floor w/ altered mental status. He transported to the ER via EMS. Initial eval showed: severe metabolic acidosis, + Beta-hydroxybutyric acid, hypotension and AMS. PCCM was asked to admit.   HPI/Subjective: 2/7 A/O 4, NAD. States just starting HD on this admission. Unable to complete Further questioning secondary to patient talking on phone.   Assessment/Plan: DKA/DM Type 2 uncontrolled -2/4 hemoglobin A1c= 15.8 -Consulted diabetic coordinator -Consulted dietitian -Lantus 10 units daily -Moderate SSI -Discontinue glucose stabilizer 2 hours after patient receives Lantus  ESRD on HD  vs AKI requiring temporary HD -HD per Nephrology  -Strict I&O -Daily weight  Rhabdomyolysis and volume depletion -Monitor closely  OSA/obesity hypoventilation syndrome -Continue CPAP  Hypertension -Control with HD at this time -PRN hydralazine   UTI?  -Asymptomatic -Ceftriaxone stop on 2/7.   Acute Encephalopathy in setting of hyperosmolar state, improving -Resolved Careful titration of insulin/sodium as above     Code Status: FULL Family Communication: no family present at time of exam Disposition Plan: Per Nephrology    Consultants: Dr.Michael Mattingly nephrology Dr.Praveen Mannam PCCM   Procedure/Significant Events: UDS 2/3>>> Negative   Culture UC 2.3>>>No growth BCX2 2/3>>> No Growth x 2 days.   Antibiotics: vanc 2/3>>> 2/4 Zosyn 2/3>> 2/4 Ceftriaxone 2/4 >> 2/7 DVT prophylaxis:    Devices SCD   LINES / TUBES:    HD Catheter 02/05 >  Foley Catheter 02/3 >    Continuous Infusions: . dextrose 50 mL/hr at 08/09/15 2018  . insulin (NOVOLIN-R) infusion 2.5 Units/hr (08/09/15 2111)    Objective: VITAL SIGNS: Temp: 98.8 F (37.1 C) (02/07 1959) Temp Source: Oral (02/07 1959) BP: 158/66 mmHg (02/07 1749) Pulse Rate: 90 (02/07 1749) SPO2; FIO2:   Intake/Output Summary (Last 24 hours) at 08/09/15 2217 Last data filed at 08/09/15 1749  Gross per 24 hour  Intake 1024.67 ml  Output    115 ml  Net 909.67 ml     Exam: General:A/O 4, NAD., No acute respiratory distress Eyes: Negative headache, negative scleral hemorrhage ENT: Negative Runny nose, negative gingival bleeding, Neck:  Negative scars, masses, torticollis, lymphadenopathy, JVD Lungs: Clear to auscultation bilaterally without wheezes or crackles Cardiovascular: Regular rate and rhythm without murmur gallop or rub normal S1 and S2 Abdomen: Morbidly obese, negative abdominal pain, nondistended, positive soft, bowel sounds, no rebound, no ascites, no appreciable mass Extremities: No significant cyanosis, clubbing, or edema bilateral lower extremities Psychiatric:  Negative depression, negative anxiety, negative fatigue, negative mania  Neurologic:  Cranial nerves II through XII intact, tongue/uvula midline, all extremities muscle strength 5/5, sensation intact throughout, negative dysarthria, negative expressive aphasia, negative receptive aphasia.   Data Reviewed: Basic Metabolic Panel:  Recent Labs Lab 08/05/15 1728  08/06/15 0245  08/09/15 0056 08/09/15 0208 08/09/15 0700 08/09/15 1445 08/09/15 1854  NA 156*  < > 166*  < > 146* 144 142 143 142  K 3.1*  < > 3.4*  < > 4.1 4.1 3.9 4.0 3.9  CL 118*  < > >130*  < > 109 108 108  109 101  CO2 11*  < > 20*  < > 20* 21* 18* 20* 25  GLUCOSE 1022*  < > 207*  < > 180* 162* 178* 161* 138*  BUN 99*  < > 113*  < > 106* 109* 113* 116* 66*  CREATININE 5.39*  < > 5.59*  < > 10.65*  10.81* 11.43* 12.27* 8.15*  CALCIUM 7.9*  < > 7.6*  < > 6.8* 6.8* 6.7* 6.6* 7.1*  MG 3.6*  --  3.4*  --   --   --   --   --   --   PHOS 4.8*  --  2.6  --   --   --   --   --   --   < > = values in this interval not displayed. Liver Function Tests:  Recent Labs Lab 08/05/15 1052  AST 55*  ALT 42  ALKPHOS 132*  BILITOT 1.6*  PROT 8.2*  ALBUMIN 3.9    Recent Labs Lab 08/05/15 1354  LIPASE 263*   No results for input(s): AMMONIA in the last 168 hours. CBC:  Recent Labs Lab 08/05/15 1052 08/05/15 1113 08/05/15 1728 08/06/15 0245 08/09/15 0700 08/09/15 1446  WBC 17.3*  --  12.4* 10.4 6.3 6.0  NEUTROABS 14.9*  --   --   --   --  4.4  HGB 16.2 18.0* 13.9 14.1 10.4* 10.0*  HCT 48.4 53.0* 42.0 41.5 30.9* 30.6*  MCV 80.8  --  82.2 81.1 79.4 79.3  PLT 206  --  160 128* 44* 42*   Cardiac Enzymes:  Recent Labs Lab 08/05/15 1052 08/05/15 1354 08/05/15 1724 08/06/15 0245  CKTOTAL 3356*  --   --   --   TROPONINI  --  0.07* 0.12* 0.49*   BNP (last 3 results) No results for input(s): BNP in the last 8760 hours.  ProBNP (last 3 results) No results for input(s): PROBNP in the last 8760 hours.  CBG:  Recent Labs Lab 08/09/15 1216 08/09/15 1319 08/09/15 1523 08/09/15 1846 08/09/15 1957  GLUCAP 168* 166* 122* 123* 152*    Recent Results (from the past 240 hour(s))  Blood Culture (routine x 2)     Status: None (Preliminary result)   Collection Time: 08/05/15 10:50 AM  Result Value Ref Range Status   Specimen Description BLOOD RIGHT ANTECUBITAL  Final   Special Requests BOTTLES DRAWN AEROBIC AND ANAEROBIC 3ML  Final   Culture   Final    NO GROWTH 4 DAYS Performed at [facility]    Report Status PENDING  Incomplete  Blood Culture (routine x 2)     Status: None (Preliminary result)   Collection Time: 08/05/15 11:20 AM  Result Value Ref Range Status   Specimen Description BLOOD BLOOD RIGHT HAND  Final   Special Requests IN PEDIATRIC BOTTLE [place]  Final    Culture   Final    NO GROWTH 4 DAYS Performed at [facility]    Report Status PENDING  Incomplete  Urine culture     Status: None   Collection Time: 08/05/15 12:05 PM  Result Value Ref Range Status   Specimen Description URINE, CATHETERIZED  Final   Special Requests NONE  Final   Culture   Final    NO GROWTH 1 DAY Performed at [facility]    Report Status 08/06/2015 FINAL  Final  MRSA PCR Screening     Status: None   Collection Time: 08/05/15  2:44 PM  Result Value  Ref Range Status   MRSA by PCR NEGATIVE NEGATIVE Final    Comment:        The GeneXpert MRSA Assay (FDA approved for NASAL specimens only), is one component of a comprehensive MRSA colonization surveillance program. It is not intended to diagnose MRSA infection nor to guide or monitor treatment for MRSA infections.      Studies:  Recent x-ray studies have been reviewed in detail by the Attending Physician  Scheduled Meds:  Scheduled Meds: . antiseptic oral rinse  7 mL Mouth Rinse q12n4p  . chlorhexidine  15 mL Mouth Rinse BID  . insulin aspart  0-15 Units Subcutaneous 6 times per day  . insulin glargine  10 Units Subcutaneous Daily  . pantoprazole (PROTONIX) IV  40 mg Intravenous Q24H    Time spent on care of this patient: 40 mins   Kenneth Lax, Geraldo Docker , MD  [facility] Office  [phone] Pager [phone]  On-Call/Text Page:      [email or web address]      password [email or web address]  If 7PM-7AM, please contact night-coverage [email or web address] Password [email or web address] 08/09/2015, 10:17 PM   LOS: 4 days   Care during the described time interval was provided by me .  I have reviewed this patient's available data, including medical history, events of note, physical examination, and all test results as part of my evaluation. I have personally reviewed and interpreted all radiology studies.   Dia Crawford, MD [phone] Pager

## 2015-08-09 NOTE — Procedures (Signed)
Objective Swallowing Evaluation: Type of Study: FEES-Fiberoptic Endoscopic Evaluation of Swallow  Patient Details  Name: Micheal Gomez MRN: [id number] Date of Birth: April 29, 1956  Today's Date: 08/09/2015 Time: SLP Start Time (ACUTE ONLY): 1100-SLP Stop Time (ACUTE ONLY): 1200 SLP Time Calculation (min) (ACUTE ONLY): 60 min  Past Medical History:  Past Medical History  Diagnosis Date  . Hypertension   . Diabetes mellitus without complication [facility])    Past Surgical History:  Past Surgical History  Procedure Laterality Date  . Appendectomy     HPI: 60 y/o male with "pre-diabetes", HTN, OSA admitted on 2/4 with acute encephalopathy and DKA with AKI and severe hypernatremia.  No Data Recorded  Assessment / Plan / Recommendation  CHL IP CLINICAL IMPRESSIONS 08/09/2015  Therapy Diagnosis Suspected primary esophageal dysphagia;Moderate pharyngeal phase dysphagia  Clinical Impression Pt presents with signs concerning for a primary esophageal dysphagia inculding erythematous pharyngeal and laryngeal mucosa and white plaques on pharyngeal wall. Backflow from cervical esophagus seen minimally over UES after sips of nectar, then dramatic projectile vomiting after a bite of puree. Pt reports a history of reflux and ulcer. Oropharyngeal phase also impaired due to delay in swallow initiation with sensed penetration and aspiration of thin liquids and large consecutive sips of nectar. Mild oral residuals fall to lateral channels post swallow, which pt senses and clears with a second swallow. Recommend Pt initiate nectar thick liquids when cleared medically from MD. Discussed findings with MD who will start diet as pts issues resolve.   Impact on safety and function Moderate aspiration risk      CHL IP TREATMENT RECOMMENDATION 08/09/2015  Treatment Recommendations Therapy as outlined in treatment plan below     Prognosis 08/09/2015  Prognosis for Safe Diet Advancement Good  Barriers to Reach Goals --   Barriers/Prognosis Comment --    CHL IP DIET RECOMMENDATION 08/09/2015  SLP Diet Recommendations Nectar thick liquid  Liquid Administration via Cup;No straw  Medication Administration Whole meds with puree  Compensations --  Postural Changes Seated upright at 90 degrees      CHL IP OTHER RECOMMENDATIONS 08/09/2015  Recommended Consults Consider GI evaluation;Consider esophageal assessment  Oral Care Recommendations --  Other Recommendations --      CHL IP FOLLOW UP RECOMMENDATIONS 08/09/2015  Follow up Recommendations 24 hour supervision/assistance      CHL IP FREQUENCY AND DURATION 08/09/2015  Speech Therapy Frequency (ACUTE ONLY) min 2x/week  Treatment Duration 2 weeks           CHL IP ORAL PHASE 08/09/2015  Oral Phase (No Data)  Oral - Pudding Teaspoon --  Oral - Pudding Cup --  Oral - Honey Teaspoon --  Oral - Honey Cup --  Oral - Nectar Teaspoon --  Oral - Nectar Cup --  Oral - Nectar Straw --  Oral - Thin Teaspoon --  Oral - Thin Cup --  Oral - Thin Straw --  Oral - Puree --  Oral - Mech Soft --  Oral - Regular --  Oral - Multi-Consistency --  Oral - Pill --  Oral Phase - Comment --    CHL IP PHARYNGEAL PHASE 08/09/2015  Pharyngeal Phase Impaired  Pharyngeal- Pudding Teaspoon --  Pharyngeal --  Pharyngeal- Pudding Cup --  Pharyngeal --  Pharyngeal- Honey Teaspoon --  Pharyngeal --  Pharyngeal- Honey Cup --  Pharyngeal --  Pharyngeal- Nectar Teaspoon --  Pharyngeal --  Pharyngeal- Nectar Cup Delayed swallow initiation-pyriform sinuses;Lateral channel residue;Pharyngeal residue - valleculae  Pharyngeal --  Pharyngeal- Nectar Straw Delayed swallow initiation-pyriform sinuses;Penetration/Aspiration before swallow;Lateral channel residue  Pharyngeal Material enters airway, remains ABOVE vocal cords then ejected out  Pharyngeal- Thin Teaspoon --  Pharyngeal --  Pharyngeal- Thin Cup Delayed swallow initiation-pyriform sinuses;Compensatory strategies attempted  (with notebox);Lateral channel residue  Pharyngeal --  Pharyngeal- Thin Straw Delayed swallow initiation-pyriform sinuses;Lateral channel residue;Penetration/Aspiration before swallow;Penetration/Aspiration during swallow  Pharyngeal Material enters airway, passes BELOW cords then ejected out  Pharyngeal- Puree Delayed swallow initiation-vallecula;Other (Comment)  Pharyngeal --  Pharyngeal- Mechanical Soft --  Pharyngeal --  Pharyngeal- Regular --  Pharyngeal --  Pharyngeal- Multi-consistency --  Pharyngeal --  Pharyngeal- Pill --  Pharyngeal --  Pharyngeal Comment --     CHL IP CERVICAL ESOPHAGEAL PHASE 08/09/2015  Cervical Esophageal Phase Impaired  Pudding Teaspoon --  Pudding Cup --  Honey Teaspoon --  Honey Cup --  Nectar Teaspoon --  Nectar Cup Esophageal backflow into the pharynx  Nectar Straw --  Thin Teaspoon --  Thin Cup --  Thin Straw --  Puree Esophageal backflow into the pharynx  Mechanical Soft --  Regular --  Multi-consistency --  Pill --  Cervical Esophageal Comment Pt observed to have residue above the UES after it had been clear, suspect backflow. Pt also projectile vomited reflux after swallowing puree.     No flowsheet data found. [name], [place] CCC-SLP [phone]  Lynann Beaver 08/09/2015, 1:26 PM

## 2015-08-09 NOTE — Progress Notes (Signed)
S: Did well with HD yest. O:BP 134/69 mmHg  Pulse 87  Temp(Src) 99.2 F (37.3 C) (Rectal)  Resp 17  Ht 5\' 9"  (1.753 m)  Wt 128.9 kg (284 lb 2.8 oz)  BMI 41.95 kg/m2  SpO2 96%  Intake/Output Summary (Last 24 hours) at 08/09/15 0745 Last data filed at 08/08/15 2000  Gross per 24 hour  Intake 1275.7 ml  Output     80 ml  Net 1195.7 ml   Weight change: 1.1 kg (2 lb 6.8 oz) [id number] and alert CVS:RRR Resp: clear Abd:+ BS NTND No HSM Ext: no edema NEURO: CNI Ox3 no asterixis   . antiseptic oral rinse  7 mL Mouth Rinse q12n4p  . cefTRIAXone (ROCEPHIN)  IV  2 g Intravenous Daily  . chlorhexidine  15 mL Mouth Rinse BID  . free water  250 mL Per Tube Q4H   Dg Chest Port 1 View  08/07/2015  CLINICAL DATA:  Central line placement EXAM: PORTABLE CHEST 1 VIEW COMPARISON:  08/05/2015 FINDINGS: Right jugular central venous catheter tip in the mid SVC. No pneumothorax. The lungs are clear. No edema or effusion. Negative for pneumonia. Improvement in bibasilar atelectasis. IMPRESSION: Satisfactory central venous catheter placement Electronically Signed   By: Franchot Gallo M.D.   On: 08/07/2015 11:05   Dg Abd Portable 1v  08/07/2015  CLINICAL DATA:  NG tube placement EXAM: PORTABLE ABDOMEN - 1 VIEW COMPARISON:  08/07/2015 chest x-ray FINDINGS: Nasogastric tube has been placed, tip overlying the level of the very proximal stomach. Bowel gas pattern is nonobstructive. IMPRESSION: Nasogastric tube tip overlying the proximal stomach. Consider advancing the tube an additional 5-10 cm. Electronically Signed   By: Nolon Nations M.D.   On: 08/07/2015 18:07   BMET    Component Value Date/Time   NA 142 08/09/2015 0700   K 3.9 08/09/2015 0700   CL 108 08/09/2015 0700   CO2 18* 08/09/2015 0700   GLUCOSE 178* 08/09/2015 0700   BUN 113* 08/09/2015 0700   CREATININE 11.43* 08/09/2015 0700   CALCIUM 6.7* 08/09/2015 0700   GFRNONAA 4* 08/09/2015 0700   GFRAA 5* 08/09/2015 0700   CBC    Component  Value Date/Time   WBC 6.3 08/09/2015 0700   RBC 3.89* 08/09/2015 0700   HGB 10.4* 08/09/2015 0700   HCT 30.9* 08/09/2015 0700   PLT PENDING 08/09/2015 0700   MCV 79.4 08/09/2015 0700   MCH 26.7 08/09/2015 0700   MCHC 33.7 08/09/2015 0700   RDW 15.3 08/09/2015 0700   LYMPHSABS 1.2 08/05/2015 1052   MONOABS 1.2* 08/05/2015 1052   EOSABS 0.0 08/05/2015 1052   BASOSABS 0.0 08/05/2015 1052     Assessment:  1. Presumably ARF in face of DKA with ATN though he has microhematuria of ? significance. (Scr 1.15 in 8/16).  Comps neg 2. DKA 3. HTN 4. Hypernatremia, resolved 5. Thrombocytopenia  Plan: 1.  Will plan HD again today 2.  DC IV fluids when able to take PO 3. Could DC insulin gtt  Frederick Marro T

## 2015-08-09 NOTE — Progress Notes (Signed)
Pt transferred to dialysis via HD nurse, Bo and report given. VSS and HD nurse states she can maintain glucose stabilizer until return. Leanne Chang, RN

## 2015-08-10 LAB — CULTURE, BLOOD (ROUTINE X 2)
Culture: NO GROWTH
Culture: NO GROWTH

## 2015-08-10 LAB — BASIC METABOLIC PANEL
ANION GAP: 11 (ref 5–15)
ANION GAP: 17 — AB (ref 5–15)
BUN: 74 mg/dL — ABNORMAL HIGH (ref 6–20)
BUN: 80 mg/dL — AB (ref 6–20)
CHLORIDE: 103 mmol/L (ref 101–111)
CO2: 27 mmol/L (ref 22–32)
CO2: 19 mmol/L — ABNORMAL LOW (ref 22–32)
CREATININE: 10.34 mg/dL — AB (ref 0.61–1.24)
Calcium: 6.9 mg/dL — ABNORMAL LOW (ref 8.9–10.3)
Calcium: 6.7 mg/dL — ABNORMAL LOW (ref 8.9–10.3)
Chloride: 102 mmol/L (ref 101–111)
Creatinine, Ser: 9.43 mg/dL — ABNORMAL HIGH (ref 0.61–1.24)
GFR calc Af Amer: 5 mL/min — ABNORMAL LOW (ref >60)
GFR, EST AFRICAN AMERICAN: 6 mL/min — AB (ref >60)
GFR, EST NON AFRICAN AMERICAN: 5 mL/min — AB (ref >60)
GFR, EST NON AFRICAN AMERICAN: 5 mL/min — AB (ref >60)
Glucose, Bld: 158 mg/dL — ABNORMAL HIGH (ref 65–99)
Glucose, Bld: 196 mg/dL — ABNORMAL HIGH (ref 65–99)
POTASSIUM: 4 mmol/L (ref 3.5–5.1)
POTASSIUM: 4.6 mmol/L (ref 3.5–5.1)
SODIUM: 141 mmol/L (ref 135–145)
SODIUM: 138 mmol/L (ref 135–145)

## 2015-08-10 LAB — GLUCOSE, CAPILLARY
GLUCOSE-CAPILLARY: 116 mg/dL — AB (ref 65–99)
GLUCOSE-CAPILLARY: 117 mg/dL — AB (ref 65–99)
GLUCOSE-CAPILLARY: 172 mg/dL — AB (ref 65–99)
GLUCOSE-CAPILLARY: 177 mg/dL — AB (ref 65–99)
GLUCOSE-CAPILLARY: 257 mg/dL — AB (ref 65–99)
Glucose-Capillary: 134 mg/dL — ABNORMAL HIGH (ref 65–99)
Glucose-Capillary: 134 mg/dL — ABNORMAL HIGH (ref 65–99)
Glucose-Capillary: 155 mg/dL — ABNORMAL HIGH (ref 65–99)
Glucose-Capillary: 155 mg/dL — ABNORMAL HIGH (ref 65–99)
Glucose-Capillary: 161 mg/dL — ABNORMAL HIGH (ref 65–99)
Glucose-Capillary: 167 mg/dL — ABNORMAL HIGH (ref 65–99)
Glucose-Capillary: 224 mg/dL — ABNORMAL HIGH (ref 65–99)
Glucose-Capillary: 225 mg/dL — ABNORMAL HIGH (ref 65–99)
Glucose-Capillary: 261 mg/dL — ABNORMAL HIGH (ref 65–99)

## 2015-08-10 LAB — HAPTOGLOBIN: HAPTOGLOBIN: 105 mg/dL (ref 34–200)

## 2015-08-10 LAB — RENAL FUNCTION PANEL
ALBUMIN: 2.2 g/dL — AB (ref 3.5–5.0)
ANION GAP: 13 (ref 5–15)
BUN: 75 mg/dL — ABNORMAL HIGH (ref 6–20)
CALCIUM: 6.8 mg/dL — AB (ref 8.9–10.3)
CO2: 25 mmol/L (ref 22–32)
Chloride: 103 mmol/L (ref 101–111)
Creatinine, Ser: 9.88 mg/dL — ABNORMAL HIGH (ref 0.61–1.24)
GFR calc Af Amer: 6 mL/min — ABNORMAL LOW (ref >60)
GFR, EST NON AFRICAN AMERICAN: 5 mL/min — AB (ref >60)
Glucose, Bld: 149 mg/dL — ABNORMAL HIGH (ref 65–99)
PHOSPHORUS: 3.6 mg/dL (ref 2.5–4.6)
POTASSIUM: 3.9 mmol/L (ref 3.5–5.1)
SODIUM: 141 mmol/L (ref 135–145)

## 2015-08-10 LAB — CK: CK TOTAL: 5703 U/L — AB (ref 49–397)

## 2015-08-10 LAB — MAGNESIUM: MAGNESIUM: 2.1 mg/dL (ref 1.7–2.4)

## 2015-08-10 MED ORDER — INSULIN ASPART 100 UNIT/ML ~~LOC~~ SOLN
0.0000 [IU] | Freq: Every day | SUBCUTANEOUS | Status: DC
  Administered 2015-08-10: 2 [IU] via SUBCUTANEOUS

## 2015-08-10 MED ORDER — SODIUM CHLORIDE 0.9 % IV SOLN
INTRAVENOUS | Status: DC
  Administered 2015-08-10: 09:00:00 via INTRAVENOUS

## 2015-08-10 MED ORDER — LIVING WELL WITH DIABETES BOOK
Freq: Once | Status: DC
  Filled 2015-08-10 (×2): qty 1

## 2015-08-10 MED ORDER — INSULIN STARTER KIT- PEN NEEDLES (ENGLISH)
1.0000 | Freq: Once | Status: DC
  Filled 2015-08-10: qty 1

## 2015-08-10 MED ORDER — FUROSEMIDE 10 MG/ML IJ SOLN
160.0000 mg | Freq: Four times a day (QID) | INTRAVENOUS | Status: DC
  Administered 2015-08-10 – 2015-08-15 (×20): 160 mg via INTRAVENOUS
  Filled 2015-08-10 (×26): qty 16

## 2015-08-10 MED ORDER — INSULIN ASPART 100 UNIT/ML ~~LOC~~ SOLN
0.0000 [IU] | Freq: Three times a day (TID) | SUBCUTANEOUS | Status: DC
  Administered 2015-08-10 (×2): 8 [IU] via SUBCUTANEOUS
  Administered 2015-08-11: 5 [IU] via SUBCUTANEOUS
  Administered 2015-08-11: 8 [IU] via SUBCUTANEOUS
  Administered 2015-08-12: 5 [IU] via SUBCUTANEOUS
  Administered 2015-08-12 – 2015-08-13 (×2): 2 [IU] via SUBCUTANEOUS
  Administered 2015-08-13: 3 [IU] via SUBCUTANEOUS
  Administered 2015-08-13: 5 [IU] via SUBCUTANEOUS
  Administered 2015-08-14: 2 [IU] via SUBCUTANEOUS
  Administered 2015-08-14 (×2): 3 [IU] via SUBCUTANEOUS
  Administered 2015-08-15: 5 [IU] via SUBCUTANEOUS
  Administered 2015-08-15 (×2): 3 [IU] via SUBCUTANEOUS
  Administered 2015-08-16: 2 [IU] via SUBCUTANEOUS
  Administered 2015-08-16 (×2): 3 [IU] via SUBCUTANEOUS
  Administered 2015-08-17: 5 [IU] via SUBCUTANEOUS
  Administered 2015-08-17 – 2015-08-18 (×3): 2 [IU] via SUBCUTANEOUS
  Administered 2015-08-18: 1 [IU] via SUBCUTANEOUS
  Administered 2015-08-19: 2 [IU] via SUBCUTANEOUS
  Administered 2015-08-19: 8 [IU] via SUBCUTANEOUS
  Administered 2015-08-20: 3 [IU] via SUBCUTANEOUS
  Administered 2015-08-20 – 2015-08-21 (×2): 2 [IU] via SUBCUTANEOUS

## 2015-08-10 MED ORDER — STARCH (THICKENING) PO POWD
ORAL | Status: DC | PRN
  Filled 2015-08-10: qty 227

## 2015-08-10 MED ORDER — PANTOPRAZOLE SODIUM 40 MG PO TBEC
40.0000 mg | DELAYED_RELEASE_TABLET | Freq: Two times a day (BID) | ORAL | Status: DC
  Administered 2015-08-10 – 2015-08-21 (×23): 40 mg via ORAL
  Filled 2015-08-10 (×24): qty 1

## 2015-08-10 NOTE — Progress Notes (Signed)
[place] TEAM 1 - Stepdown/ICU TEAM PROGRESS NOTE  Micheal Gomez [id number] DOB: [birth year] DOA: 08/05/2015 PCP: No primary care provider on file.  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 He had been contributing this to his CPAP machine. On 2/3 he was found by his fiancee on the floor w/ altered mental status, and transported to the ER via EMS. Initial eval showed: severe metabolic acidosis, + Beta-hydroxybutyric acid, hypotension and AMS.    HPI/Subjective: The pt is resting comfortably in a bedside chair.  He denies cp, sob, n/v, or abdom pain.  He is alert and oriented.  His speech is a bit garbled, but intelligible, and he tells me this is his baseline/normal speech.    Assessment/Plan:  DKA / DM2 uncontrolled -2/4 A1c 15.8 - lantus initiated - off insulin gtt - DM education ongoing - CBG reasonably controlled - pt says he was told by the [facility] clinic that he was "borderline diabetic"  ESRD v/s AKI/ATN requiring temporary HD -HD per Nephrology   Rhabdomyolysis and volume depletion -f/u CK pending    Thrombocytopenia  -unclear etiology - ?hemolysis related to rhabdo - follow - avoid anticoagulants for now   OSA/OHS -Continue CPAP  Hypertension -BP not ideal but variable - avoid over aggressive correction in setting of ongoing HD    Abnormal UA  -initial UA not convincing for UTI - f/u UA c/w UTI - culture negative thus far - off abx - follow   Acute Encephalopathy in setting of hyperosmolar state -Resolved   Code Status: FULL Family Communication: no family present at time of exam Disposition Plan: transfer to renal floor - ongoing HD per Nephrology - ongoing DM education   Consultants: Nephrology  PCCM  Procedures: none  Antibiotics: vanc 2/3 > 2/4 zosyn 2/3 > 2/4 ceftriaxone 2/4 > 2/7  DVT  prophylaxis: SCDs  Objective: Blood pressure 124/101, pulse 98, temperature 98.1 F (36.7 C), temperature source Oral, resp. rate 14, height 5\' 9"  (1.753 m), weight 125.5 kg (276 lb 10.8 oz), SpO2 96 %.  Intake/Output Summary (Last 24 hours) at 08/10/15 0847 Last data filed at 08/10/15 0600  Gross per 24 hour  Intake 810.56 ml  Output    275 ml  Net 535.56 ml   Exam: General: No acute respiratory distress - alert and conversant  Lungs: Clear to auscultation bilaterally without wheezes or crackles Cardiovascular: Regular rate and rhythm without murmur gallop or rub normal S1 and S2 Abdomen: Nontender, nondistended, soft, bowel sounds positive, no rebound, no ascites, no appreciable mass Extremities: No significant cyanosis, clubbing, or edema bilateral lower extremities  Data Reviewed:  Basic Metabolic Panel:  Recent Labs Lab 08/05/15 1728  08/06/15 0245  08/09/15 0700 08/09/15 1445 08/09/15 1854 08/10/15 0145 08/10/15 0435  NA 156*  < > 166*  < > 142 143 142 141 141  K 3.1*  < > 3.4*  < > 3.9 4.0 3.9 4.0 3.9  CL 118*  < > >130*  < > 108 109 101 103 103  CO2 11*  < > 20*  < > 18* 20* 25 27 25   GLUCOSE 1022*  < > 207*  < > 178* 161* 138* 158* 149*  BUN 99*  < > 113*  < > 113* 116* 66* 74* 75*  CREATININE 5.39*  < > 5.59*  < >  11.43* 12.27* 8.15* 9.43* 9.88*  CALCIUM 7.9*  < > 7.6*  < > 6.7* 6.6* 7.1* 6.9* 6.8*  MG 3.6*  --  3.4*  --   --   --   --   --   --   PHOS 4.8*  --  2.6  --   --   --   --   --  3.6  < > = values in this interval not displayed.  CBC:  Recent Labs Lab 08/05/15 1052 08/05/15 1113 08/05/15 1728 08/06/15 0245 08/09/15 0700 08/09/15 1446  WBC 17.3*  --  12.4* 10.4 6.3 6.0  NEUTROABS 14.9*  --   --   --   --  4.4  HGB 16.2 18.0* 13.9 14.1 10.4* 10.0*  HCT 48.4 53.0* 42.0 41.5 30.9* 30.6*  MCV 80.8  --  82.2 81.1 79.4 79.3  PLT 206  --  160 128* 44* 42*    Liver Function Tests:  Recent Labs Lab 08/05/15 1052 08/10/15 0435  AST 55*   --   ALT 42  --   ALKPHOS 132*  --   BILITOT 1.6*  --   PROT 8.2*  --   ALBUMIN 3.9 2.2*    Recent Labs Lab 08/05/15 1354  LIPASE 263*   Cardiac Enzymes:  Recent Labs Lab 08/05/15 1052 08/05/15 1354 08/05/15 1724 08/06/15 0245  CKTOTAL 3356*  --   --   --   TROPONINI  --  0.07* 0.12* 0.49*    CBG:  Recent Labs Lab 08/10/15 0031 08/10/15 0134 08/10/15 0238 08/10/15 0423 08/10/15 0752  GLUCAP 155* 155* 134* 134* 177*    Recent Results (from the past 240 hour(s))  Blood Culture (routine x 2)     Status: None (Preliminary result)   Collection Time: 08/05/15 10:50 AM  Result Value Ref Range Status   Specimen Description BLOOD RIGHT ANTECUBITAL  Final   Special Requests BOTTLES DRAWN AEROBIC AND ANAEROBIC 3ML  Final   Culture   Final    NO GROWTH 4 DAYS Performed at [facility]    Report Status PENDING  Incomplete  Blood Culture (routine x 2)     Status: None (Preliminary result)   Collection Time: 08/05/15 11:20 AM  Result Value Ref Range Status   Specimen Description BLOOD BLOOD RIGHT HAND  Final   Special Requests IN PEDIATRIC BOTTLE [place]  Final   Culture   Final    NO GROWTH 4 DAYS Performed at [facility]    Report Status PENDING  Incomplete  Urine culture     Status: None   Collection Time: 08/05/15 12:05 PM  Result Value Ref Range Status   Specimen Description URINE, CATHETERIZED  Final   Special Requests NONE  Final   Culture   Final    NO GROWTH 1 DAY Performed at [facility]    Report Status 08/06/2015 FINAL  Final  MRSA PCR Screening     Status: None   Collection Time: 08/05/15  2:44 PM  Result Value Ref Range Status   MRSA by PCR NEGATIVE NEGATIVE Final    Comment:        The GeneXpert MRSA Assay (FDA approved for NASAL specimens only), is one component of a comprehensive MRSA colonization surveillance program. It is not intended to diagnose MRSA infection nor to guide or monitor treatment for MRSA  infections.      Studies:   Recent x-ray studies have been reviewed in detail by the Attending  Physician  Scheduled Meds:  Scheduled Meds: . antiseptic oral rinse  7 mL Mouth Rinse q12n4p  . chlorhexidine  15 mL Mouth Rinse BID  . furosemide  160 mg Intravenous Q6H  . insulin aspart  0-15 Units Subcutaneous 6 times per day  . insulin glargine  10 Units Subcutaneous Daily  . pantoprazole (PROTONIX) IV  40 mg Intravenous Q24H    Time spent on care of this patient: 35 mins   Guy Seese T , MD   [facility] Office  [phone] Pager - Text Page per Shea Evans as per below:  On-Call/Text Page:      [email or web address]      password [email or web address]  If 7PM-7AM, please contact night-coverage [email or web address] Password [email or web address] 08/10/2015, 8:47 AM   LOS: 5 days

## 2015-08-10 NOTE — Progress Notes (Signed)
Speech Language Pathology Treatment: Dysphagia  Patient Details Name: Chord Mccurty MRN: [id number] DOB: [birth year] Today's Date: 08/10/2015 Time: [id number] SLP Time Calculation (min) (ACUTE ONLY): 15 min  Assessment / Plan / Recommendation Clinical Impression  Pt up in chair, demonstrates improvement today though speech still slightly slurred. Less subjective pain reported with swallowing, no signs of aspiration with nectar thick textures or with puree with meds. Discussed importance of esophageal precautions with pt, particularly sleeping position. Recommend pt continue full, nectar thick liquid diet today with f/u for trials of solids as pt progresses.    HPI HPI: 60 y/o male with "pre-diabetes", HTN, OSA admitted on 2/4 with acute encephalopathy and DKA with AKI and severe hypernatremia.      SLP Plan        Recommendations  Diet recommendations: Nectar-thick liquid Liquids provided via: Cup;No straw Medication Administration: Whole meds with puree Supervision: Patient able to self feed Compensations: Slow rate;Small sips/bites Postural Changes and/or Swallow Maneuvers: Seated upright 90 degrees;Upright 30-60 min after meal;Out of bed for meals             Follow up Recommendations: 24 hour supervision/assistance     GO               [name], MA CCC-SLP [phone]  Lynann Beaver 08/10/2015, 10:31 AM

## 2015-08-10 NOTE — Progress Notes (Signed)
Patient is not ready to wear CPAP at this time. Patient has agreed to notify RN and have RN notify respiratory when he is ready to place on CPAP for the night.

## 2015-08-10 NOTE — Care Management Note (Signed)
Case Management Note  Patient Details  Name: Micheal Gomez MRN: [id number] Date of Birth: January 05, 1956  Subjective/Objective:     Pt admitted with DKA, Creatinine >11, hypernatremic.  Pt will have HD today due to AKI             Action/Plan:  Pt is independent from home alone, finance can provide support/supervision post discharge.   Pt has PCP Dr Milinda Pointer with [facility] clinic and gets his medications filled at their pharmacy.  CM contacted transfer coordinator to inform of admit, CM also left voice mail for [facility] [phone] informing of admit.     Expected Discharge Date:   (UNKNOWN)               Expected Discharge Plan:  Home/Self Care  In-House Referral:     Discharge planning Services  CM Consult  Post Acute Care Choice:    Choice offered to:     DME Arranged:    DME Agency:     HH Arranged:    HH Agency:     Status of Service:  In process, will continue to follow  Medicare Important Message Given:    Date Medicare IM Given:    Medicare IM give by:    Date Additional Medicare IM Given:    Additional Medicare Important Message give by:     If discussed at [place] of Stay Meetings, dates discussed:    Additional Comments: Pt received callback from [facility] with [facility]; requested consult with all discharge needs. Maryclare Labrador, RN 08/10/2015, 9:32 AM

## 2015-08-10 NOTE — Evaluation (Signed)
Physical Therapy Evaluation Patient Details Name: Micheal Gomez MRN: [id number] DOB: 24-Sep-1955 Today's Date: 08/10/2015   History of Present Illness  60 y/o male with "pre-diabetes", HTN, OSA admitted on 2/4 with acute encephalopathy and DKA with AKI and severe hypernatremia  Clinical Impression  Pt admitted with above complications. Pt currently with functional limitations due to the deficits listed below (see PT Problem List). Demonstrates intermittent instability with gait, using a rolling walker for support. SpO2 98% on room air, HR up to 120s with elevated respiratory rate. Very motivated to work with PT. Fiance and daughter to provide assist as needed at d/c. Pt will benefit from skilled PT to increase their independence and safety with mobility to allow discharge to the venue listed below.       Follow Up Recommendations Supervision for mobility/OOB (Will likely not need PT follow-up. Pending progress)    Equipment Recommendations   (TBD pending progress)    Recommendations for Other Services       Precautions / Restrictions Restrictions Weight Bearing Restrictions: No      Mobility  Bed Mobility Overal bed mobility: Modified Independent             General bed mobility comments: extra time  Transfers Overall transfer level: Needs assistance Equipment used: Rolling walker (2 wheeled) Transfers: Sit to/from Stand Sit to Stand: Min guard         General transfer comment: min guard for safety. VC for hand placement. Mild sway but able to self correct with use of RW for support.  Ambulation/Gait Ambulation/Gait assistance: Min guard Ambulation Distance (Feet): 90 Feet Assistive device: Rolling walker (2 wheeled) Gait Pattern/deviations: Step-through pattern;Decreased stride length;Staggering right Gait velocity: decreased Gait velocity interpretation: Below normal speed for age/gender General Gait Details: Min guard for safety. Some instances of staggering  towards Rt but able to self correct with RW. Safely steps backwards without loss of balance. No buckling noted. Cues for safety and correct walker usage. SpO2 98% on room air, HR up to 120s.   Stairs            Wheelchair Mobility    Modified Rankin (Stroke Patients Only)       Balance Overall balance assessment: Needs assistance Sitting-balance support: Feet supported;No upper extremity supported Sitting balance-Leahy Scale: Normal     Standing balance support: No upper extremity supported Standing balance-Leahy Scale: Fair                               Pertinent Vitals/Pain Pain Assessment: No/denies pain    Home Living Family/patient expects to be discharged to:: Private residence Living Arrangements: Alone Available Help at Discharge: Family;Available 24 hours/day (Fiance and daughter) Type of Home: House Home Access: Stairs to enter Entrance Stairs-Rails: Chemical engineer of Steps: 3 Home Layout: One level Home Equipment: Cane - single point      Prior Function Level of Independence: Independent               Hand Dominance   Dominant Hand: Right    Extremity/Trunk Assessment   Upper Extremity Assessment: Defer to OT evaluation           Lower Extremity Assessment: Overall WFL for tasks assessed         Communication   Communication: Expressive difficulties  Cognition Arousal/Alertness: Awake/alert Behavior During Therapy: WFL for tasks assessed/performed Overall Cognitive Status: Within Functional Limits for tasks assessed  General Comments General comments (skin integrity, edema, etc.): Encouraged to walk with nursing staff while admitted    Exercises General Exercises - Lower Extremity Ankle Circles/Pumps: AROM;Both;20 reps;Seated      Assessment/Plan    PT Assessment Patient needs continued PT services  PT Diagnosis Difficulty walking;Abnormality of gait   PT  Problem List Decreased activity tolerance;Decreased balance;Decreased mobility;Decreased knowledge of use of DME;Cardiopulmonary status limiting activity;Obesity  PT Treatment Interventions DME instruction;Gait training;Stair training;Functional mobility training;Therapeutic activities;Therapeutic exercise;Balance training;Patient/family education   PT Goals (Current goals can be found in the Care Plan section) Acute Rehab PT Goals Patient Stated Goal: Go home PT Goal Formulation: With patient Time For Goal Achievement: 08/24/15 Potential to Achieve Goals: Good    Frequency Min 3X/week   Barriers to discharge        Co-evaluation               End of Session Equipment Utilized During Treatment: Gait belt Activity Tolerance: Patient tolerated treatment well Patient left: in bed;with call bell/phone within reach;with bed alarm set;with SCD's reapplied Nurse Communication: Mobility status         Time: [id number] PT Time Calculation (min) (ACUTE ONLY): 20 min   Charges:   PT Evaluation $PT Eval Low Complexity: 1 Procedure     PT G CodesEllouise Newer 08/10/2015, 3:42 PM Camille Bal Morton, [place]

## 2015-08-10 NOTE — Progress Notes (Signed)
Pt transported to 6e20 via wheelchair and RN. Telephone report given to [facility]. Pt ambulated with walker and 2 person standby assist upon arrival to unit. Family present during transfer.

## 2015-08-10 NOTE — Progress Notes (Signed)
   08/10/15 1625  What Happened  Was fall witnessed? Yes  Who witnessed fall? Retta Mac, RN  Patients activity before fall bathroom-assisted;other (comment) (from [facility] to Bed)  Point of contact hip/leg;buttocks (Right)  Was patient injured? No  Follow Up  MD notified Dr. Thereasa Solo  Time MD notified [phone] (awaiting return call)  Time family notified 1741 (patient was unable to speak)  Adult Fall Risk Assessment  Risk Factor Category (scoring not indicated) High fall risk per protocol (document High fall risk)  Patient's Fall Risk High Fall Risk (>13 points)  Adult Fall Risk Interventions  Required Bundle Interventions *See Row Information* High fall risk - low, moderate, and high requirements implemented  Additional Interventions Fall risk signage;Individualized elimination schedule;Secure all tubes/drains;Use of appropriate toileting equipment (bedpan, BSC, etc.);Room near nurses station  Fall with Injury Screening  Risk For Fall Injury- See Row Information  Nurse judgement  Intervention(s) for 2 or more risk criteria identified Gait Belt  Vitals  Temp 98.1 F (36.7 C)  Temp Source Oral  BP (!) 181/66 mmHg  BP Location Right Arm  BP Method Automatic  Patient Position (if appropriate) Lying  Pulse Rate (!) 103  Pulse Rate Source Dinamap  Resp 20  Oxygen Therapy  SpO2 100 %  O2 Device Room Air  Pain Assessment  Pain Assessment No/denies pain  Pain Score 0  Neurological  Neuro (WDL) WDL  Level of Consciousness Alert  Orientation Level Oriented X4  Cognition Appropriate at baseline  Speech Clear  Pupil Assessment  Yes  R Pupil Size (mm) 3  R Pupil Shape Round  R Pupil Reaction Brisk  L Pupil Size (mm) 3  L Pupil Shape Round  L Pupil Reaction Brisk  Additional Pupil Assessments No  Motor Function/Sensation Assessment Motor response  RUE Motor Response Purposeful movement  LUE Motor Response Purposeful movement  RLE Motor Response Purposeful movement  LLE Motor  Response Purposeful movement  Neuro Symptoms None  Neuro Additional Assessments No  Glasgow Coma Scale  Eye Opening 4  Best Verbal Response (NON-intubated) 5  Best Motor Response 6  Glasgow Coma Scale Score 15  Musculoskeletal  Musculoskeletal (WDL) X  Assistive Device BSC  Generalized Weakness Yes  Weight Bearing Restrictions No  Integumentary  Integumentary (WDL) WDL  Skin Color Appropriate for ethnicity  Skin Condition Dry  Skin Integrity Abrasion  Abrasion Location Knee  Abrasion Location Orientation Left  Skin Turgor Non-tenting  patient educated on how to safely get into bed, and to use his call bell when requiring assistance from staff.  Theodosia Paling, RN 08/10/2015 5:50 PM

## 2015-08-10 NOTE — Progress Notes (Signed)
Nutrition Consult  Received MD consult for new onset diabetes diet education. Patient is currently requiring a full liquid, nectar thick liquid diet. SLP continues to follow patient for texture and liquid advancement. Diet education is not appropriate at this time. RD to follow-up at a later date/time to complete diet education.  Molli Barrows, RD, LDN, [place] Pager [phone] After Hours Pager [phone]

## 2015-08-10 NOTE — Progress Notes (Signed)
Inpatient Diabetes Program Recommendations  AACE/ADA: New Consensus Statement on Inpatient Glycemic Control (2015)  Target Ranges:  Prepandial:   less than 140 mg/dL      Peak postprandial:   less than 180 mg/dL (1-2 hours)      Critically ill patients:  140 - 180 mg/dL   Review of Glycemic Control  Diabetes history: "Pre-diabetes" Outpatient Diabetes medications: None Current orders for Inpatient glycemic control: Novolog moderate tidwc, Lantus 10 units QD  Results for Micheal, Gomez (MRN [id number]) as of 08/10/2015 11:59  Ref. Range 08/10/2015 00:31 08/10/2015 01:34 08/10/2015 02:38 08/10/2015 04:23 08/10/2015 07:52  Glucose-Capillary Latest Ref Range: 65-99 mg/dL 155 (H) 155 (H) 134 (H) 134 (H) 177 (H)  Results for Micheal, Gomez (MRN [id number]) as of 08/10/2015 11:59  Ref. Range 08/06/2015 13:05  Hemoglobin A1C Latest Ref Range: 4.8-5.6 % 15.8 (H)   Needs insulin adjustment since GlucoStabilizer discontinued.  Inpatient Diabetes Program Recommendations:    Lantus 12 units bid (starting 2/8) Decrease Novolog to sensitive tidwc and hs Add Novolog 3 units tidwc for meal coverage insulin if pt eats > 50% meal Will order Living Well With Diabetes book and Insulin Pen starter kit since pt will likely be discharged on insulin Ordered Diabetes videos on pt ed channel for pt to view.  Will continue to follow. Thank you. Lorenda Peck, RD, LDN, CDE Inpatient Diabetes Coordinator [phone]

## 2015-08-10 NOTE — Progress Notes (Signed)
S: No new CO.  Tolerated HD well O:BP 143/61 mmHg  Pulse 89  Temp(Src) 97.8 F (36.6 C) (Oral)  Resp 16  Ht 5\' 9"  (1.753 m)  Wt 125.5 kg (276 lb 10.8 oz)  BMI 40.84 kg/m2  SpO2 96%  Intake/Output Summary (Last 24 hours) at 08/10/15 0639 Last data filed at 08/10/15 0600  Gross per 24 hour  Intake 965.19 ml  Output    275 ml  Net 690.19 ml   Weight change: 5.4 kg (11 lb 14.5 oz) [id number] and alert CVS:RRR Resp: clear Abd:+ BS NTND No HSM Ext: no edema NEURO: CNI Ox3 no asterixis   . antiseptic oral rinse  7 mL Mouth Rinse q12n4p  . chlorhexidine  15 mL Mouth Rinse BID  . insulin aspart  0-15 Units Subcutaneous 6 times per day  . insulin glargine  10 Units Subcutaneous Daily  . pantoprazole (PROTONIX) IV  40 mg Intravenous Q24H   No results found. BMET    Component Value Date/Time   NA 141 08/10/2015 0435   K 3.9 08/10/2015 0435   CL 103 08/10/2015 0435   CO2 25 08/10/2015 0435   GLUCOSE 149* 08/10/2015 0435   BUN 75* 08/10/2015 0435   CREATININE 9.88* 08/10/2015 0435   CALCIUM 6.8* 08/10/2015 0435   GFRNONAA 5* 08/10/2015 0435   GFRAA 6* 08/10/2015 0435   CBC    Component Value Date/Time   WBC 6.0 08/09/2015 1446   RBC 3.86* 08/09/2015 1446   RBC 3.86* 08/09/2015 1446   HGB 10.0* 08/09/2015 1446   HCT 30.6* 08/09/2015 1446   PLT 42* 08/09/2015 1446   MCV 79.3 08/09/2015 1446   MCH 25.9* 08/09/2015 1446   MCHC 32.7 08/09/2015 1446   RDW 15.2 08/09/2015 1446   LYMPHSABS 1.1 08/09/2015 1446   MONOABS 0.4 08/09/2015 1446   EOSABS 0.1 08/09/2015 1446   BASOSABS 0.0 08/09/2015 1446     Assessment:  1. Presumably ARF in face of DKA with ATN though he has microhematuria of ? significance. (Scr 1.15 in 8/16).  Comps and serologies  neg 2. DKA 3. HTN 4. Hypernatremia, resolved 5. Thrombocytopenia  Plan: 1.  He made some urine yest so will resume lasix to see if can stimulate more. If not then plan HD again in AM.  Serologies neg so acute GN less  likely.  Suspect ATN and anticipate recovery Vidya Bamford T

## 2015-08-10 NOTE — Progress Notes (Signed)
Patient refused CPAP for tonight.  Told patient to have RT called should he change his mind.

## 2015-08-11 ENCOUNTER — Inpatient Hospital Stay (HOSPITAL_COMMUNITY): Payer: Non-veteran care

## 2015-08-11 DIAGNOSIS — E1165 Type 2 diabetes mellitus with hyperglycemia: Secondary | ICD-10-CM

## 2015-08-11 LAB — CBC
HCT: 30.4 % — ABNORMAL LOW (ref 39.0–52.0)
Hemoglobin: 9.8 g/dL — ABNORMAL LOW (ref 13.0–17.0)
MCH: 25.8 pg — ABNORMAL LOW (ref 26.0–34.0)
MCHC: 32.2 g/dL (ref 30.0–36.0)
MCV: 80 fL (ref 78.0–100.0)
Platelets: 78 10*3/uL — ABNORMAL LOW (ref 150–400)
RBC: 3.8 MIL/uL — ABNORMAL LOW (ref 4.22–5.81)
RDW: 14.5 % (ref 11.5–15.5)
WBC: 6 10*3/uL (ref 4.0–10.5)

## 2015-08-11 LAB — COMPREHENSIVE METABOLIC PANEL
ALK PHOS: 128 U/L — AB (ref 38–126)
ALT: 166 U/L — AB (ref 17–63)
AST: 113 U/L — AB (ref 15–41)
Albumin: 2.5 g/dL — ABNORMAL LOW (ref 3.5–5.0)
Anion gap: 16 — ABNORMAL HIGH (ref 5–15)
BILIRUBIN TOTAL: 1.1 mg/dL (ref 0.3–1.2)
BUN: 98 mg/dL — AB (ref 6–20)
CALCIUM: 7 mg/dL — AB (ref 8.9–10.3)
CO2: 22 mmol/L (ref 22–32)
CREATININE: 12.99 mg/dL — AB (ref 0.61–1.24)
Chloride: 101 mmol/L (ref 101–111)
GFR calc Af Amer: 4 mL/min — ABNORMAL LOW (ref >60)
GFR, EST NON AFRICAN AMERICAN: 4 mL/min — AB (ref >60)
Glucose, Bld: 284 mg/dL — ABNORMAL HIGH (ref 65–99)
Potassium: 4.5 mmol/L (ref 3.5–5.1)
Sodium: 139 mmol/L (ref 135–145)
TOTAL PROTEIN: 6 g/dL — AB (ref 6.5–8.1)

## 2015-08-11 LAB — GLUCOSE, CAPILLARY
GLUCOSE-CAPILLARY: 227 mg/dL — AB (ref 65–99)
GLUCOSE-CAPILLARY: 278 mg/dL — AB (ref 65–99)
Glucose-Capillary: 212 mg/dL — ABNORMAL HIGH (ref 65–99)

## 2015-08-11 LAB — CK: CK TOTAL: 3084 U/L — AB (ref 49–397)

## 2015-08-11 MED ORDER — SODIUM CHLORIDE 0.9% FLUSH
10.0000 mL | INTRAVENOUS | Status: DC | PRN
  Administered 2015-08-11 – 2015-08-18 (×9): 10 mL
  Filled 2015-08-11 (×9): qty 40

## 2015-08-11 MED ORDER — INSULIN GLARGINE 100 UNIT/ML ~~LOC~~ SOLN
16.0000 [IU] | Freq: Every day | SUBCUTANEOUS | Status: DC
  Administered 2015-08-12 – 2015-08-21 (×10): 16 [IU] via SUBCUTANEOUS
  Filled 2015-08-11 (×10): qty 0.16

## 2015-08-11 MED ORDER — CLONIDINE HCL 0.1 MG PO TABS: 0.1000 mg | ORAL_TABLET | ORAL | Status: DC | PRN

## 2015-08-11 MED ORDER — AMLODIPINE BESYLATE 5 MG PO TABS
5.0000 mg | ORAL_TABLET | Freq: Every day | ORAL | Status: DC
  Administered 2015-08-11 – 2015-08-21 (×11): 5 mg via ORAL
  Filled 2015-08-11 (×11): qty 1

## 2015-08-11 NOTE — Progress Notes (Signed)
[facility] PROGRESS NOTE  Dontrey Snellgrove ZOX:[id number] DOB: 10/22/1955 DOA: 08/05/2015 PCP: No primary care provider on file.  Brief narrative 60 year old male with history of hypertension, diabetes (recently diagnosed but not treated), recently diagnosed OSA and started on CPAP was found on the floor by his unsafe for altered mental status. He was brought to the ED where he was hypotensive with encephalopathy, had severe metabolic acidosis with positive beta hydroxybutyric acid . CBG was >600 blood work showed severe and gap acidosis with blood glucose of 1500. He also had acute kidney injury with BUN of 103 and creatinine of 6.48. Lactic acid was elevated to 3.93.  UA Showed hematuria, glycosuria with many bacteria. Also had rhabdomyolysis. Patient admitted to ICU and started on glucose stabilizer. Nephrology was consulted and patient started on temporary hemodialysis. DKA and metabolic acidosis resolved and patient transfer to stepdown unit.    Assessment/Plan: DKA in the setting of uncontrolled diabetes mellitus A1c of 15.8. Required insulin drip on admission. Now started on insulin. Continue diabetic education. FSG well controlled. Patient reported when he followed at [facility] clinic that he was borderline diabetic. FSG in the range of 200-300. Will increase Lantus to 16 units daily. Continue sliding scale. Add meal coverage if remains elevated tomorrow.  Acute kidney injury/ATN requiring temporary hemodialysis.  Presumed acute kidney injury in the setting of DKA. Compliments and serologies negative. Has good urine output. Foley discontinued today. Renal following. Plan to monitor further without dialysis. Continue aggressive diuresis with Lasix drip. Monitor strict I/O. Monitor lites and renal function daily.  Rhabdomyolysis Possibly in the setting ATN and severe dehydration. CK mildly improved since admission (was worse yesterday). Continue to monitor.  Essential  hypertension Continue Lasix drip. Added amlodipine.  Acute metabolic encephalopathy In the setting of severe DKA and? Uremia. Now resolved.  Transaminitis ? Associated with rhabdomyolysis. Worsened LFTs noted this morning. Check liver ultrasound and acute hepatitis panel.  thrombocytopenia No clear etiology? Associated with rhabdo. Avoid heparin products.  Macrocytic anemia Low iron saturation noted.  DVT prophylaxis: Subcutaneous Lovenox  Code Status: Full code  Family Communication: None at bedside  Disposition Plan: Home once renal function improved.  Consultants: Renal PC CM   Procedures: HD catheter placement Renal ultrasound  Antibiotics:  none  HPI/Subjective:   Objective: Filed Vitals:   08/11/15 0502 08/11/15 0924  BP: 196/64 156/78  Pulse: 102 95  Temp: 98.3 F (36.8 C) 98.6 F (37 C)  Resp: 20 20    Intake/Output Summary (Last 24 hours) at 08/11/15 1319 Last data filed at 08/11/15 1052  Gross per 24 hour  Intake   1706 ml  Output    851 ml  Net    855 ml   Filed Weights   08/09/15 1519 08/09/15 1749 08/10/15 0436  Weight: 128.9 kg (284 lb 2.8 oz) 128.9 kg (284 lb 2.8 oz) 125.5 kg (276 lb 10.8 oz)    Exam:   General:  Middle aged obese male not in distress  HEENT: No pallor, moist mucosa, right IJ  Chest: Clear bilaterally  CVS: Normal S1 and S2, no murmurs rub or gallop  GI: Soft, nondistended, nontender, bowel sounds present  [place]: Warm, trace edema  CNS: Alert and oriented    Data Reviewed: Basic Metabolic Panel:  Recent Labs Lab 08/05/15 1728  08/06/15 0245  08/09/15 1854 08/10/15 0145 08/10/15 0435 08/10/15 0803 08/11/15 0940  NA 156*  < > 166*  < > 142 141 141 138 139  K 3.1*  < >  3.4*  < > 3.9 4.0 3.9 4.6 4.5  CL 118*  < > >130*  < > 101 103 103 102 101  CO2 11*  < > 20*  < > '25 27 25 '$ 19* 22  GLUCOSE 1022*  < > 207*  < > 138* 158* 149* 196* 284*  BUN 99*  < > 113*  < > 66* 74* 75* 80* 98*  CREATININE  5.39*  < > 5.59*  < > 8.15* 9.43* 9.88* 10.34* 12.99*  CALCIUM 7.9*  < > 7.6*  < > 7.1* 6.9* 6.8* 6.7* 7.0*  MG 3.6*  --  3.4*  --   --   --   --  2.1  --   PHOS 4.8*  --  2.6  --   --   --  3.6  --   --   < > = values in this interval not displayed. Liver Function Tests:  Recent Labs Lab 08/05/15 1052 08/10/15 0435 08/11/15 0940  AST 55*  --  113*  ALT 42  --  166*  ALKPHOS 132*  --  128*  BILITOT 1.6*  --  1.1  PROT 8.2*  --  6.0*  ALBUMIN 3.9 2.2* 2.5*    Recent Labs Lab 08/05/15 1354  LIPASE 263*   No results for input(s): AMMONIA in the last 168 hours. CBC:  Recent Labs Lab 08/05/15 1052  08/05/15 1728 08/06/15 0245 08/09/15 0700 08/09/15 1446 08/11/15 0940  WBC 17.3*  --  12.4* 10.4 6.3 6.0 6.0  NEUTROABS 14.9*  --   --   --   --  4.4  --   HGB 16.2  < > 13.9 14.1 10.4* 10.0* 9.8*  HCT 48.4  < > 42.0 41.5 30.9* 30.6* 30.4*  MCV 80.8  --  82.2 81.1 79.4 79.3 80.0  PLT 206  --  160 128* 44* 42* 78*  < > = values in this interval not displayed. Cardiac Enzymes:  Recent Labs Lab 08/05/15 1052 08/05/15 1354 08/05/15 1724 08/06/15 0245 08/10/15 0803 08/11/15 0940  CKTOTAL 3356*  --   --   --  5703* 3084*  TROPONINI  --  0.07* 0.12* 0.49*  --   --    BNP (last 3 results) No results for input(s): BNP in the last 8760 hours.  ProBNP (last 3 results) No results for input(s): PROBNP in the last 8760 hours.  CBG:  Recent Labs Lab 08/10/15 1617 08/10/15 2109 08/10/15 2159 08/11/15 0720 08/11/15 1257  GLUCAP 257* 224* 225* 227* 278*    Recent Results (from the past 240 hour(s))  Blood Culture (routine x 2)     Status: None   Collection Time: 08/05/15 10:50 AM  Result Value Ref Range Status   Specimen Description BLOOD RIGHT ANTECUBITAL  Final   Special Requests BOTTLES DRAWN AEROBIC AND ANAEROBIC 3ML  Final   Culture   Final    NO GROWTH 5 DAYS Performed at [facility]    Report Status 08/10/2015 FINAL  Final  Blood Culture (routine  x 2)     Status: None   Collection Time: 08/05/15 11:20 AM  Result Value Ref Range Status   Specimen Description BLOOD BLOOD RIGHT HAND  Final   Special Requests IN PEDIATRIC BOTTLE [place]  Final   Culture   Final    NO GROWTH 5 DAYS Performed at [facility]    Report Status 08/10/2015 FINAL  Final  Urine culture     Status: None  Collection Time: 08/05/15 12:05 PM  Result Value Ref Range Status   Specimen Description URINE, CATHETERIZED  Final   Special Requests NONE  Final   Culture   Final    NO GROWTH 1 DAY Performed at [facility]    Report Status 08/06/2015 FINAL  Final  MRSA PCR Screening     Status: None   Collection Time: 08/05/15  2:44 PM  Result Value Ref Range Status   MRSA by PCR NEGATIVE NEGATIVE Final    Comment:        The GeneXpert MRSA Assay (FDA approved for NASAL specimens only), is one component of a comprehensive MRSA colonization surveillance program. It is not intended to diagnose MRSA infection nor to guide or monitor treatment for MRSA infections.      Studies: No results found.  Scheduled Meds: . amLODipine  5 mg Oral Daily  . antiseptic oral rinse  7 mL Mouth Rinse q12n4p  . chlorhexidine  15 mL Mouth Rinse BID  . furosemide  160 mg Intravenous Q6H  . insulin aspart  0-15 Units Subcutaneous TID WC  . insulin aspart  0-5 Units Subcutaneous QHS  . insulin glargine  10 Units Subcutaneous Daily  . insulin starter kit- pen needles  1 kit Other Once  . living well with diabetes book   Does not apply Once  . pantoprazole  40 mg Oral BID   Continuous Infusions: . sodium chloride 10 mL/hr at 08/10/15 7618    Active Problems:   DKA (diabetic ketoacidoses) ([place])   Acute renal failure ([place])   Diabetic ketoacidosis with coma associated with type 2 diabetes mellitus ([place])   End-stage renal disease on hemodialysis ([place])   Non-traumatic rhabdomyolysis   OSA on CPAP   Obesity hypoventilation syndrome ([place])   Acute  encephalopathy    Time spent: 25 minutes    Majestic Molony  [facility] Pager:[phone]. If 7PM-7AM, please contact night-coverage at [email or web address], password [facility] 08/11/2015, 1:19 PM  LOS: 6 days

## 2015-08-11 NOTE — Progress Notes (Signed)
08/11/2015 8:27 AM  Removed patient foley with no complications. Patient verbalized understanding in using the urinal and being on strict I/Os. Patient voided immediatly after removal of 25cc. Will continue to assess and monitor the patient.  [employer], RN-BC, [employer] [facility] Phone [phone]

## 2015-08-11 NOTE — Progress Notes (Signed)
S: Feels well.  Has an appetite O:BP 196/64 mmHg  Pulse 102  Temp(Src) 98.3 F (36.8 C) (Oral)  Resp 20  Ht '5\' 9"'$  (1.753 m)  Wt 125.5 kg (276 lb 10.8 oz)  BMI 40.84 kg/m2  SpO2 100%  Intake/Output Summary (Last 24 hours) at 08/11/15 0746 Last data filed at 08/11/15 0504  Gross per 24 hour  Intake   1143 ml  Output    486 ml  Net    657 ml   Weight change:  NID:POEUM and alert CVS:RRR Resp: clear Abd:+ BS NTND No HSM Ext: no edema NEURO: CNI Ox3 no asterixis   . antiseptic oral rinse  7 mL Mouth Rinse q12n4p  . chlorhexidine  15 mL Mouth Rinse BID  . furosemide  160 mg Intravenous Q6H  . insulin aspart  0-15 Units Subcutaneous TID WC  . insulin aspart  0-5 Units Subcutaneous QHS  . insulin glargine  10 Units Subcutaneous Daily  . insulin starter kit- pen needles  1 kit Other Once  . living well with diabetes book   Does not apply Once  . pantoprazole  40 mg Oral BID   No results found. BMET    Component Value Date/Time   NA 138 08/10/2015 0803   K 4.6 08/10/2015 0803   CL 102 08/10/2015 0803   CO2 19* 08/10/2015 0803   GLUCOSE 196* 08/10/2015 0803   BUN 80* 08/10/2015 0803   CREATININE 10.34* 08/10/2015 0803   CALCIUM 6.7* 08/10/2015 0803   GFRNONAA 5* 08/10/2015 0803   GFRAA 5* 08/10/2015 0803   CBC    Component Value Date/Time   WBC 6.0 08/09/2015 1446   RBC 3.86* 08/09/2015 1446   RBC 3.86* 08/09/2015 1446   HGB 10.0* 08/09/2015 1446   HCT 30.6* 08/09/2015 1446   PLT 42* 08/09/2015 1446   MCV 79.3 08/09/2015 1446   MCH 25.9* 08/09/2015 1446   MCHC 32.7 08/09/2015 1446   RDW 15.2 08/09/2015 1446   LYMPHSABS 1.1 08/09/2015 1446   MONOABS 0.4 08/09/2015 1446   EOSABS 0.1 08/09/2015 1446   BASOSABS 0.0 08/09/2015 1446     Assessment:  1. Presumably ARF in face of DKA with ATN though he has microhematuria of ? significance. (Scr 1.15 in 8/16).  Comps and serologies  Neg.  UO improving some 2. DKA 3. HTN 4. Hypernatremia, resolved 5.  Thrombocytopenia  Plan: 1.  DC foley and cont to check I/O 2. Await labs but will not plan HD unless something unexpected.  Hopefully he is starting to recover renal fx 3. Daily labs 4. Start amlodipine for BP Micheal Gomez

## 2015-08-11 NOTE — Progress Notes (Signed)
Inpatient Diabetes Program Recommendations  AACE/ADA: New Consensus Statement on Inpatient Glycemic Control (2015)  Target Ranges:  Prepandial:   less than 140 mg/dL      Peak postprandial:   less than 180 mg/dL (1-2 hours)      Critically ill patients:  140 - 180 mg/dL   Spoke with patient about new diabetes diagnosis.  Discussed A1C results (15.8%) and explained what an A1C is and informed patient that his current A1C indicates an average glucose of 240 mg/dl over the past 2-3 months. Discussed basic pathophysiology of DM Type 2, basic home care, importance of checking CBGs and maintaining good CBG control to prevent long-term and short-term complications. Reviewed glucose and A1C goals and explained that patient will need to continue to  Reviewed signs and symptoms of hyperglycemia and hypoglycemia along with treatment for both. Discussed impact of nutrition, exercise, stress, sickness, and medications on diabetes control. Patient is very grateful for the information given to him by RD. Reviewed Living Well with diabetes booklet patient had already read through entire book. Informed patient that he will be discharged on insulin at discharge. Explained the difference between long and short acting insulin. Spoke to patient about getting a glucose meter and checking his glucose 2-3 times a day and to keep a log book of glucose readings and insulin taken. Explained how the doctor he follows up with can use the log book to continue to make insulin adjustments if needed.   Educated patient and daughter on insulin pen use at home. Reviewed contents of insulin flexpen starter kit. Reviewed all steps if insulin pen including attachment of needle, 2-unit air shot, dialing up dose, giving injection, removing needle, disposal of sharps, storage of unused insulin, disposal of insulin etc. Patient able to provide successful return demonstration. Also reviewed troubleshooting with insulin pen. MD to give patient Rxs  for insulin pens and insulin pen needles. Patient verbalized understanding of information discussed and he states that he has no further questions at this time related to diabetes. RNs to provide ongoing basic DM education at bedside with this patient and engage patient to actively check blood glucose and administer insulin injections.   MD at time of discharge please order: Long acting basal insulin of choice May need short acting insulin as well Insulin pen needles (order # [id number]) Glucose meter kit (order # [id number])  Thanks, Tama Headings RN, MSN, [place] Diabetes Coordinator Team Pager [phone] (8a-5p)

## 2015-08-11 NOTE — Evaluation (Signed)
Occupational Therapy Evaluation Patient Details Name: Micheal Gomez MRN: [id number] DOB: 1955/09/17 Today's Date: 08/11/2015    History of Present Illness 60 y/o male with "pre-diabetes", HTN, OSA admitted on 2/4 with acute encephalopathy and DKA with AKI and severe hypernatremia   Clinical Impression   Patient presenting with decreased ADL and functional mobility independence secondary to above. Patient independent PTA. Patient currently functioning at an overall supervision to min guard assist level. Patient will benefit from acute OT to increase overall independence in the areas of ADLs, functional mobility, and overall safety in order to safely discharge home with assistance from family.   According to patient, he fell yesterday with nursing staff. Pt reports fall due to "loss of balance". At this time, am recommending 24/7 supervision for any OOB mobility and tasks due to patient's poor balance. Depending on progress, pt MAY need additional HHOT.    Follow Up Recommendations  No OT follow up;Supervision/Assistance - 24 hour    Equipment Recommendations  3 in 1 bedside comode    Recommendations for Other Services  None at this time    Precautions / Restrictions Precautions Precautions: Fall Restrictions Weight Bearing Restrictions: No    Mobility Bed Mobility General bed mobility comments: Pt round seated in recliner upon OT entering/exiting room  Transfers Overall transfer level: Needs assistance Equipment used: Rolling walker (2 wheeled) Transfers: Sit to/from Stand Sit to Stand: Min guard General transfer comment: Min guard for safety. VC for hand placement, eventhough therapist went over this 2 times pt still pulled up on RW. Pt unsteady on feet upon initial stand. Pt performed sit to/from stand from recliner and comfort height toilet seat.     Balance Overall balance assessment: Needs assistance Sitting-balance support: No upper extremity supported;Feet  supported Sitting balance-Leahy Scale: Good     Standing balance support: During functional activity;Single extremity supported Standing balance-Leahy Scale: Fair Standing balance comment: Pt used RW for safety with functional ambulation/mobility. Pt did stand at sink for grooming task of brushing teeth and washing face with one hand supported.     ADL Overall ADL's : Needs assistance/impaired Eating/Feeding: Set up;Sitting   Grooming: Min guard;Standing Grooming Details (indicate cue type and reason): at sink Upper Body Bathing: Sitting;Set up   Lower Body Bathing: Sit to/from stand;Min guard   Upper Body Dressing : Set up;Sitting   Lower Body Dressing: Sit to/from stand;Min guard   Toilet Transfer: Min guard;RW;Comfort height toilet;Ambulation;Grab bars   Toileting- Clothing Manipulation and Hygiene: Supervision/safety;Sitting/lateral lean     Tub/Shower Transfer Details (indicate cue type and reason): did not occur Functional mobility during ADLs: Min guard General ADL Comments: Pt reports fall yesterday with nursing staff. Pt required increased time and cueing for safety with ADLs and functional mobility using RW.     Pertinent Vitals/Pain Pain Assessment: No/denies pain     Hand Dominance Right   Extremity/Trunk Assessment Upper Extremity Assessment Upper Extremity Assessment: Generalized weakness   Lower Extremity Assessment Lower Extremity Assessment: Defer to PT evaluation   Cervical / Trunk Assessment Cervical / Trunk Assessment: Normal   Communication Communication Communication: Expressive difficulties   Cognition Arousal/Alertness: Awake/alert Behavior During Therapy: WFL for tasks assessed/performed Overall Cognitive Status: No family/caregiver present to determine baseline cognitive functioning (pt with decreased ability to follow commands) Memory: Decreased short-term memory              Home Living Family/patient expects to be discharged  to:: Private residence Living Arrangements: Alone Available Help at Discharge: Family;Available PRN/intermittently (  fiance and daughter) Type of Home: House Home Access: Stairs to enter Technical brewer of Steps: 3 Entrance Stairs-Rails: Left;Right Home Layout: One level     Bathroom Shower/Tub: Corporate investment banker: Standard     Home Equipment: Cane - single point   Additional Comments: Pt reports fiance will not be able to be with him due to taking care of her grandchild and his daughter will not be able to be with him all the time seconary to work. He will only have intermittent supervision/assistance.      Prior Functioning/Environment Level of Independence: Independent     OT Diagnosis: Generalized weakness   OT Problem List: Decreased strength;Decreased activity tolerance;Impaired balance (sitting and/or standing);Decreased safety awareness;Decreased knowledge of use of DME or AE;Decreased knowledge of precautions   OT Treatment/Interventions: Self-care/ADL training;Therapeutic exercise;DME and/or AE instruction;Energy conservation;Therapeutic activities;Patient/family education;Balance training    OT Goals(Current goals can be found in the care plan section) Acute Rehab OT Goals Patient Stated Goal: Go home OT Goal Formulation: With patient Time For Goal Achievement: 08/25/15 Potential to Achieve Goals: Good ADL Goals Pt Will Perform Grooming: with modified independence;standing Pt Will Perform Lower Body Bathing: with modified independence;sit to/from stand Pt Will Perform Lower Body Dressing: with modified independence;sit to/from stand Pt Will Transfer to Toilet: with modified independence;ambulating;bedside commode Pt Will Perform Tub/Shower Transfer: Tub transfer;ambulating;with modified independence;3 in 1;rolling walker Additional ADL Goal #1: Pt will be mod I using RW for functional mobility and during ADLs  OT Frequency: Min  2X/week   Barriers to D/C: Decreased caregiver support   End of Session Equipment Utilized During Treatment: Gait belt;Rolling walker  Activity Tolerance: Patient tolerated treatment well Patient left: in chair;with call bell/phone within reach;with chair alarm set   Time: 1344-1406 OT Time Calculation (min): 22 min Charges:  OT General Charges $OT Visit: 1 Procedure OT Evaluation $OT Eval Low Complexity: 1 Procedure  Chrys Racer , MS, OTR/L, CLT Pager: [phone]  08/11/2015, 2:17 PM

## 2015-08-11 NOTE — Progress Notes (Addendum)
Inpatient Diabetes Program Recommendations  AACE/ADA: New Consensus Statement on Inpatient Glycemic Control (2015)  Target Ranges:  Prepandial:   less than 140 mg/dL      Peak postprandial:   less than 180 mg/dL (1-2 hours)      Critically ill patients:  140 - 180 mg/dL   Results for Micheal Gomez, Micheal Gomez (MRN [id number]) as of 08/11/2015 10:58  Ref. Range 08/10/2015 12:34 08/10/2015 16:17 08/10/2015 21:09 08/10/2015 21:59 08/11/2015 07:20  Glucose-Capillary Latest Ref Range: 65-99 mg/dL 261 (H) 257 (H) 224 (H) 225 (H) 227 (H)   Review of Glycemic Control  Inpatient Diabetes Program Recommendations:  Insulin - Basal: Glucose consistently in the 200's still. Please consider increasing basal insulin to Lantus 15 units Q24hrs.  Thanks,  Tama Headings RN, MSN, [facility] Inpatient Diabetes Coordinator Team Pager [phone] (8a-5p)

## 2015-08-11 NOTE — Plan of Care (Signed)
Problem: Food- and Nutrition-Related Knowledge Deficit (NB-1.1) Goal: Nutrition education Formal process to instruct or train a patient/client in a skill or to impart knowledge to help patients/clients voluntarily manage or modify food choices and eating behavior to maintain or improve health. Outcome: Completed/Met Date Met:  08/11/15  RD consulted for nutrition education regarding diabetes.     Lab Results  Component Value Date    HGBA1C 15.8* 08/06/2015    RD provided "Carbohydrate Counting for People with Diabetes" handout from the Academy of Nutrition and Dietetics. Discussed different food groups and their effects on blood sugar, emphasizing carbohydrate-containing foods. Provided list of carbohydrates and recommended serving sizes of common foods.  Discussed importance of controlled and consistent carbohydrate intake throughout the day. Recommended 4-5 serving of carbohydrates at meals (60-75 grams of carbohydrates). Provided examples of ways to balance meals/snacks and encouraged intake of high-fiber, whole grain complex carbohydrates. Discussed diabetic friendly drink options. Teach back method used.  Expect good compliance.  Body mass index is 40.84 kg/(m^2). Pt meets criteria for morbid obesity based on current BMI.  Current diet order is full liquids with nectar thick liquids, patient is consuming approximately 100% of meals at this time. Appetite is fine currently and PTA. Labs and medications reviewed. No further nutrition interventions warranted at this time. RD contact information provided. If additional nutrition issues arise, please re-consult RD.  Corrin Parker, MS, RD, LDN Pager # [phone] After hours/ weekend pager # [phone]

## 2015-08-12 DIAGNOSIS — R740 Nonspecific elevation of levels of transaminase and lactic acid dehydrogenase [LDH]: Secondary | ICD-10-CM

## 2015-08-12 DIAGNOSIS — R7401 Elevation of levels of liver transaminase levels: Secondary | ICD-10-CM | POA: Diagnosis present

## 2015-08-12 DIAGNOSIS — R739 Hyperglycemia, unspecified: Secondary | ICD-10-CM

## 2015-08-12 LAB — GLUCOSE, CAPILLARY
GLUCOSE-CAPILLARY: 171 mg/dL — AB (ref 65–99)
GLUCOSE-CAPILLARY: 224 mg/dL — AB (ref 65–99)
GLUCOSE-CAPILLARY: 230 mg/dL — AB (ref 65–99)
GLUCOSE-CAPILLARY: 139 mg/dL — AB (ref 65–99)
GLUCOSE-CAPILLARY: 160 mg/dL — AB (ref 65–99)

## 2015-08-12 LAB — RENAL FUNCTION PANEL
ANION GAP: 17 — AB (ref 5–15)
Albumin: 2.3 g/dL — ABNORMAL LOW (ref 3.5–5.0)
BUN: 109 mg/dL — ABNORMAL HIGH (ref 6–20)
CALCIUM: 7 mg/dL — AB (ref 8.9–10.3)
CHLORIDE: 101 mmol/L (ref 101–111)
CO2: 22 mmol/L (ref 22–32)
Creatinine, Ser: 14.01 mg/dL — ABNORMAL HIGH (ref 0.61–1.24)
GFR calc Af Amer: 4 mL/min — ABNORMAL LOW (ref >60)
GFR calc non Af Amer: 3 mL/min — ABNORMAL LOW (ref >60)
GLUCOSE: 220 mg/dL — AB (ref 65–99)
POTASSIUM: 4.1 mmol/L (ref 3.5–5.1)
Phosphorus: 7.4 mg/dL — ABNORMAL HIGH (ref 2.5–4.6)
SODIUM: 140 mmol/L (ref 135–145)

## 2015-08-12 LAB — HEPATITIS PANEL, ACUTE
HEP B S AG: NEGATIVE
Hep A IgM: NEGATIVE
Hep B C IgM: NEGATIVE

## 2015-08-12 LAB — CK: CK TOTAL: 1781 U/L — AB (ref 49–397)

## 2015-08-12 NOTE — Progress Notes (Signed)
Physical Therapy Treatment Patient Details Name: Micheal Gomez MRN: [id number] DOB: 1956-01-21 Today's Date: 08/12/2015    History of Present Illness 60 y/o male with "pre-diabetes", HTN, OSA admitted on 2/4 with acute encephalopathy and DKA with AKI and severe hypernatremia    PT Comments    Making progress today, focused on transfer training, safety with RW use, gait training, and stair navigation. Some difficulty with more dynamic gait tasks but safely navigates stairs. Recommendations updated for HHPT at d/c. Patient will continue to benefit from skilled physical therapy services to further improve independence with functional mobility.   Follow Up Recommendations  Supervision for mobility/OOB;Home health PT     Equipment Recommendations  Rolling walker with 5" wheels    Recommendations for Other Services       Precautions / Restrictions Precautions Precautions: Fall Restrictions Weight Bearing Restrictions: No    Mobility  Bed Mobility Overal bed mobility: Needs Assistance Bed Mobility: Rolling;Sidelying to Sit;Sit to Sidelying Rolling: Supervision         General bed mobility comments: Concerned about getting in/out of bed at home and straining due to reported hernia. Educated on log roll technique x3 with supervision for safety. No physical assist needed.  Transfers Overall transfer level: Needs assistance Equipment used: Rolling walker (2 wheeled) Transfers: Sit to/from [facility] Sit to Stand: Supervision Stand pivot transfers: Supervision       General transfer comment: Practiced sit<>stand and pivot transfer from and to multiple surfaces. VC for technique. No physical assist needed, no loss of balance.  Ambulation/Gait Ambulation/Gait assistance: Min guard Ambulation Distance (Feet): 125 Feet Assistive device: Rolling walker (2 wheeled);None Gait Pattern/deviations: Step-through pattern;Decreased stride length;Drifts  right/left;Staggering left;Shuffle Gait velocity: decreased Gait velocity interpretation: Below normal speed for age/gender General Gait Details: Some difficulty with turns and vertical head turns while ambulating. Tolerate high marching and backwards stepping safely. Close guard for safety but no physical assist needed. Focused on these dynamic gait challenges today, noting increased instability and slight stagger at times.  Tolerated first half of gait without RW but pt became fatigued and eventually required RW.   Stairs Stairs: Yes Stairs assistance: Min guard Stair Management: Two rails;Step to pattern;Forwards;One rail Right;Sideways Number of Stairs: 4 (+2) General stair comments: Cues for step to pattern. Difficulty stepping up with RLE, much better leading with LLE to rise, lowering RLE for descent. Use of rail bil for 4 steps. Trialed single rail sideways approach with both hands on 1 rail well.  Wheelchair Mobility    Modified Rankin (Stroke Patients Only)       Balance                                    Cognition Arousal/Alertness: Awake/alert Behavior During Therapy: WFL for tasks assessed/performed Overall Cognitive Status: Within Functional Limits for tasks assessed                      Exercises General Exercises - Lower Extremity Ankle Circles/Pumps: AROM;Both;20 reps;Seated Gluteal Sets: Strengthening;Both;5 reps;Seated Heel Slides: Strengthening;Both;5 reps;Supine Hip ABduction/ADduction: Strengthening;Both;5 reps;Supine Straight Leg Raises: Strengthening;Both;5 reps;Supine    General Comments General comments (skin integrity, edema, etc.): Encouraged to walk with nursing staff, while using RW.      Pertinent Vitals/Pain Pain Assessment: No/denies pain    Home Living  Prior Function            PT Goals (current goals can now be found in the care plan section) Acute Rehab PT Goals Patient Stated  Goal: Go home PT Goal Formulation: With patient Time For Goal Achievement: 08/24/15 Potential to Achieve Goals: Good Progress towards PT goals: Progressing toward goals    Frequency  Min 3X/week    PT Plan Discharge plan needs to be updated;Equipment recommendations need to be updated    Co-evaluation             End of Session Equipment Utilized During Treatment: Gait belt Activity Tolerance: Patient tolerated treatment well Patient left: in bed;with call bell/phone within reach;with bed alarm set     Time: [id number] PT Time Calculation (min) (ACUTE ONLY): 21 min  Charges:  $Gait Training: 8-22 mins                    G Codes:      Ellouise Newer 09-02-15, 12:24 PM  Camille Bal [place], [place]

## 2015-08-12 NOTE — Procedures (Signed)
Pt seen on HD. Ap 90 Vp 80  BFR 250.  Tolerating HD well so far.

## 2015-08-12 NOTE — Progress Notes (Signed)
SLP Cancellation Note  Patient Details Name: Micheal Gomez MRN: [id number] DOB: April 29, 1956   Cancelled treatment:       Reason Eval/Treat Not Completed: Patient at procedure or test/unavailable (HD)   [name], M.A. CCC-SLP [phone]  [name] 08/12/2015, 2:19 PM

## 2015-08-12 NOTE — Progress Notes (Signed)
[facility] PROGRESS NOTE  Micheal Gomez [id number] DOB: [birth year] DOA: 08/05/2015 PCP: No primary care provider on file.  Brief narrative 60 year old male with history of hypertension, diabetes (recently diagnosed but not treated), recently diagnosed OSA and started on CPAP was found on the floor by his unsafe for altered mental status. He was brought to the ED where he was hypotensive with encephalopathy, had severe metabolic acidosis with positive beta hydroxybutyric acid . CBG was >600 blood work showed severe and gap acidosis with blood glucose of 1500. He also had acute kidney injury with BUN of 103 and creatinine of 6.48. Lactic acid was elevated to 3.93.  UA Showed hematuria, glycosuria with many bacteria. Also had rhabdomyolysis. Patient admitted to ICU and started on glucose stabilizer. Nephrology was consulted and patient started on temporary hemodialysis. DKA and metabolic acidosis resolved and patient transfer to stepdown unit.    Assessment/Plan: DKA in the setting of uncontrolled diabetes mellitus A1c of 15.8. Required insulin drip on admission. Now started on insulin. Continue diabetic education. FSG well controlled. Patient reported when he followed at [facility] clinic that he was borderline diabetic.  FSG was  in 200-300 so increased Lantus to 16 units daily. In low 200s today. Continue sliding scale. Will add low meal coverage.  Acute kidney injury/ATN requiring temporary hemodialysis.  Presumed acute kidney injury in the setting of DKA. Compliments and serologies negative. Has good urine output. Foley discontinued . -Worsened creatinine today (14). Scheduled dialysis for today. Continue aggressive diuresis with Lasix drip. Monitor strict I/O. Monitor lites and renal function daily.  Rhabdomyolysis Possibly in the setting ATN and severe dehydration. CK improving   Essential hypertension Continue Lasix drip. Added amlodipine.  Acute metabolic encephalopathy In  the setting of severe DKA and? Uremia. Now resolved.  Transaminitis ? Associated with rhabdomyolysis. Acute hepatitis panel negative. Liver ultrasound shows fatty changes only. Follow LFTs in a.m.  thrombocytopenia No clear etiology? Associated with rhabdo. Avoid heparin products.  Macrocytic anemia Low iron saturation noted.   Diet: Full liquid. Pending speech and swallow follow-up  Code Status: Full code  Family Communication: None at bedside  Disposition Plan: Home once renal function improved.  Consultants: Renal PC CM   Procedures: HD catheter placement Renal ultrasound  Antibiotics:  none  HPI/Subjective: Seen and examined. Reports being able to urinate by himself today. Was able to ambulate with PT in the hallway  Objective: Filed Vitals:   08/12/15 1245 08/12/15 1300  BP: 148/87 160/78  Pulse: 90 85  Temp:    Resp: 20 18    Intake/Output Summary (Last 24 hours) at 08/12/15 1427 Last data filed at 08/12/15 0900  Gross per 24 hour  Intake    852 ml  Output    725 ml  Net    127 ml   Filed Weights   08/10/15 0436 08/11/15 2103 08/12/15 1238  Weight: 125.5 kg (276 lb 10.8 oz) 125.2 kg (276 lb 0.3 oz) 122.1 kg (269 lb 2.9 oz)    Exam:   General:  Middle aged obese male not in distress  HEENT:  moist mucosa, right IJ  Chest: Clear bilaterally  CVS: Normal S1 and S2, no murmurs rub or gallop  GI: Soft, nondistended, nontender, bowel sounds present  Musculoskeletal: Warm, trace edema     Data Reviewed: Basic Metabolic Panel:  Recent Labs Lab 08/05/15 1728  08/06/15 0245  08/10/15 0145 08/10/15 0435 08/10/15 0803 08/11/15 0940 08/12/15 0500  NA 156*  < > 166*  < >  141 141 138 139 140  K 3.1*  < > 3.4*  < > 4.0 3.9 4.6 4.5 4.1  CL 118*  < > >130*  < > 103 103 102 101 101  CO2 11*  < > 20*  < > 27 25 19* 22 22  GLUCOSE 1022*  < > 207*  < > 158* 149* 196* 284* 220*  BUN 99*  < > 113*  < > 74* 75* 80* 98* 109*  CREATININE 5.39*  <  > 5.59*  < > 9.43* 9.88* 10.34* 12.99* 14.01*  CALCIUM 7.9*  < > 7.6*  < > 6.9* 6.8* 6.7* 7.0* 7.0*  MG 3.6*  --  3.4*  --   --   --  2.1  --   --   PHOS 4.8*  --  2.6  --   --  3.6  --   --  7.4*  < > = values in this interval not displayed. Liver Function Tests:  Recent Labs Lab 08/10/15 0435 08/11/15 0940 08/12/15 0500  AST  --  113*  --   ALT  --  166*  --   ALKPHOS  --  128*  --   BILITOT  --  1.1  --   PROT  --  6.0*  --   ALBUMIN 2.2* 2.5* 2.3*   No results for input(s): LIPASE, AMYLASE in the last 168 hours. No results for input(s): AMMONIA in the last 168 hours. CBC:  Recent Labs Lab 08/05/15 1728 08/06/15 0245 08/09/15 0700 08/09/15 1446 08/11/15 0940  WBC 12.4* 10.4 6.3 6.0 6.0  NEUTROABS  --   --   --  4.4  --   HGB 13.9 14.1 10.4* 10.0* 9.8*  HCT 42.0 41.5 30.9* 30.6* 30.4*  MCV 82.2 81.1 79.4 79.3 80.0  PLT 160 128* 44* 42* 78*   Cardiac Enzymes:  Recent Labs Lab 08/05/15 1724 08/06/15 0245 08/10/15 0803 08/11/15 0940 08/12/15 0500  CKTOTAL  --   --  5703* 3084* 1781*  TROPONINI 0.12* 0.49*  --   --   --    BNP (last 3 results) No results for input(s): BNP in the last 8760 hours.  ProBNP (last 3 results) No results for input(s): PROBNP in the last 8760 hours.  CBG:  Recent Labs Lab 08/11/15 1257 08/11/15 1720 08/11/15 2100 08/12/15 0811 08/12/15 1224  GLUCAP 278* 212* 171* 224* 230*    Recent Results (from the past 240 hour(s))  Blood Culture (routine x 2)     Status: None   Collection Time: 08/05/15 10:50 AM  Result Value Ref Range Status   Specimen Description BLOOD RIGHT ANTECUBITAL  Final   Special Requests BOTTLES DRAWN AEROBIC AND ANAEROBIC 3ML  Final   Culture   Final    NO GROWTH 5 DAYS Performed at [facility]    Report Status 08/10/2015 FINAL  Final  Blood Culture (routine x 2)     Status: None   Collection Time: 08/05/15 11:20 AM  Result Value Ref Range Status   Specimen Description BLOOD BLOOD RIGHT  HAND  Final   Special Requests IN PEDIATRIC BOTTLE [place]  Final   Culture   Final    NO GROWTH 5 DAYS Performed at [facility]    Report Status 08/10/2015 FINAL  Final  Urine culture     Status: None   Collection Time: 08/05/15 12:05 PM  Result Value Ref Range Status   Specimen Description URINE, CATHETERIZED  Final  Special Requests NONE  Final   Culture   Final    NO GROWTH 1 DAY Performed at [facility]    Report Status 08/06/2015 FINAL  Final  MRSA PCR Screening     Status: None   Collection Time: 08/05/15  2:44 PM  Result Value Ref Range Status   MRSA by PCR NEGATIVE NEGATIVE Final    Comment:        The GeneXpert MRSA Assay (FDA approved for NASAL specimens only), is one component of a comprehensive MRSA colonization surveillance program. It is not intended to diagnose MRSA infection nor to guide or monitor treatment for MRSA infections.      Studies: US Abdomen Complete  08/11/2015  CLINICAL DATA:  Transaminitis. EXAM: ABDOMEN ULTRASOUND COMPLETE COMPARISON:  None. FINDINGS: Gallbladder: No gallstones or wall thickening visualized. No sonographic Murphy sign noted by sonographer. Common bile duct: Diameter: 3.6 mm Liver: Diffusely increased echogenicity with decreased through transmission of the sound beam. No focal mass or lesion. IVC: No abnormality visualized. Pancreas: Not well visualized. Spleen: Size and appearance within normal limits. Right Kidney: Length: 11.7 cm. Echogenicity within normal limits. No mass or hydronephrosis visualized. Left Kidney: Length: 11.1 cm. Echogenicity within normal limits. No mass or hydronephrosis visualized. Abdominal aorta: No aneurysm visualized. Other findings: None. IMPRESSION: 1. No acute findings. Normal gallbladder with no bile duct dilation. 2. Diffuse hepatic steatosis. Electronically Signed   By: Lajean Manes M.D.   On: 08/11/2015 20:21    Scheduled Meds: . amLODipine  5 mg Oral Daily  . antiseptic oral  rinse  7 mL Mouth Rinse q12n4p  . chlorhexidine  15 mL Mouth Rinse BID  . furosemide  160 mg Intravenous Q6H  . insulin aspart  0-15 Units Subcutaneous TID WC  . insulin aspart  0-5 Units Subcutaneous QHS  . insulin glargine  16 Units Subcutaneous Daily  . insulin starter kit- pen needles  1 kit Other Once  . living well with diabetes book   Does not apply Once  . pantoprazole  40 mg Oral BID   Continuous Infusions: . sodium chloride 10 mL/hr at 08/10/15 0854      Time spent: 25 minutes    Tristram Milian  [facility] SFSEL:[phone]. If 7PM-7AM, please contact night-coverage at [email or web address], password [facility] 08/12/2015, 2:27 PM  LOS: 7 days

## 2015-08-12 NOTE — Progress Notes (Signed)
S: Feels well.  Wants regular food O:BP 157/65 mmHg  Pulse 100  Temp(Src) 97.9 F (36.6 C) (Oral)  Resp 18  Ht '5\' 9"'$  (1.753 m)  Wt 125.2 kg (276 lb 0.3 oz)  BMI 40.74 kg/m2  SpO2 95%  Intake/Output Summary (Last 24 hours) at 08/12/15 0839 Last data filed at 08/12/15 0612  Gross per 24 hour  Intake   1762 ml  Output    825 ml  Net    937 ml   Weight change:  EZM:OQHUT and alert CVS:RRR Resp: clear Abd:+ BS NTND No HSM Ext: no edema NEURO: CNI Ox3 no asterixis Rt IJ temp cath   . amLODipine  5 mg Oral Daily  . antiseptic oral rinse  7 mL Mouth Rinse q12n4p  . chlorhexidine  15 mL Mouth Rinse BID  . furosemide  160 mg Intravenous Q6H  . insulin aspart  0-15 Units Subcutaneous TID WC  . insulin aspart  0-5 Units Subcutaneous QHS  . insulin glargine  16 Units Subcutaneous Daily  . insulin starter kit- pen needles  1 kit Other Once  . living well with diabetes book   Does not apply Once  . pantoprazole  40 mg Oral BID   US Abdomen Complete  08/11/2015  CLINICAL DATA:  Transaminitis. EXAM: ABDOMEN ULTRASOUND COMPLETE COMPARISON:  None. FINDINGS: Gallbladder: No gallstones or wall thickening visualized. No sonographic Murphy sign noted by sonographer. Common bile duct: Diameter: 3.6 mm Liver: Diffusely increased echogenicity with decreased through transmission of the sound beam. No focal mass or lesion. IVC: No abnormality visualized. Pancreas: Not well visualized. Spleen: Size and appearance within normal limits. Right Kidney: Length: 11.7 cm. Echogenicity within normal limits. No mass or hydronephrosis visualized. Left Kidney: Length: 11.1 cm. Echogenicity within normal limits. No mass or hydronephrosis visualized. Abdominal aorta: No aneurysm visualized. Other findings: None. IMPRESSION: 1. No acute findings. Normal gallbladder with no bile duct dilation. 2. Diffuse hepatic steatosis. Electronically Signed   By: Lajean Manes M.D.   On: 08/11/2015 20:21   BMET    Component Value  Date/Time   NA 140 08/12/2015 0500   K 4.1 08/12/2015 0500   CL 101 08/12/2015 0500   CO2 22 08/12/2015 0500   GLUCOSE 220* 08/12/2015 0500   BUN 109* 08/12/2015 0500   CREATININE 14.01* 08/12/2015 0500   CALCIUM 7.0* 08/12/2015 0500   GFRNONAA 3* 08/12/2015 0500   GFRAA 4* 08/12/2015 0500   CBC    Component Value Date/Time   WBC 6.0 08/11/2015 0940   RBC 3.80* 08/11/2015 0940   RBC 3.86* 08/09/2015 1446   HGB 9.8* 08/11/2015 0940   HCT 30.4* 08/11/2015 0940   PLT 78* 08/11/2015 0940   MCV 80.0 08/11/2015 0940   MCH 25.8* 08/11/2015 0940   MCHC 32.2 08/11/2015 0940   RDW 14.5 08/11/2015 0940   LYMPHSABS 1.1 08/09/2015 1446   MONOABS 0.4 08/09/2015 1446   EOSABS 0.1 08/09/2015 1446   BASOSABS 0.0 08/09/2015 1446     Assessment:  1. Presumably ARF in face of DKA with ATN though he has microhematuria of ? significance. (Scr 1.15 in 8/16).  Comps and serologies Neg.  UO improving but Scr higher so has not quite recovered yet 2. DKA 3. HTN 4. Hypernatremia, resolved 5. Thrombocytopenia  Plan: 1. Plan HD today then will follow renal fx over weekend.  Anticipate renal fx to recover. 2. Recheck labs in AM Tiersa Dayley T

## 2015-08-12 NOTE — Progress Notes (Signed)
OT Cancellation Note  Patient Details Name: Micheal Gomez MRN: [id number] DOB: 07/12/1955   Cancelled Treatment:    Reason Eval/Treat Not Completed: Patient at procedure or test/ unavailable (HD). Will check back as schedule allows for OT treatment.  Chrys Racer , MS, OTR/L, CLT Pager: [phone]  08/12/2015, 2:41 PM

## 2015-08-13 LAB — GLUCOSE, CAPILLARY
GLUCOSE-CAPILLARY: 147 mg/dL — AB (ref 65–99)
Glucose-Capillary: 235 mg/dL — ABNORMAL HIGH (ref 65–99)
Glucose-Capillary: 168 mg/dL — ABNORMAL HIGH (ref 65–99)
Glucose-Capillary: 155 mg/dL — ABNORMAL HIGH (ref 65–99)

## 2015-08-13 LAB — CBC
HEMATOCRIT: 29.4 % — AB (ref 39.0–52.0)
HEMOGLOBIN: 9.8 g/dL — AB (ref 13.0–17.0)
MCH: 26.7 pg (ref 26.0–34.0)
MCHC: 33.3 g/dL (ref 30.0–36.0)
MCV: 80.1 fL (ref 78.0–100.0)
Platelets: 133 10*3/uL — ABNORMAL LOW (ref 150–400)
RBC: 3.67 MIL/uL — ABNORMAL LOW (ref 4.22–5.81)
RDW: 14.3 % (ref 11.5–15.5)
WBC: 5.9 10*3/uL (ref 4.0–10.5)

## 2015-08-13 LAB — COMPREHENSIVE METABOLIC PANEL
ALBUMIN: 2.6 g/dL — AB (ref 3.5–5.0)
ALK PHOS: 148 U/L — AB (ref 38–126)
ALT: 123 U/L — ABNORMAL HIGH (ref 17–63)
AST: 63 U/L — ABNORMAL HIGH (ref 15–41)
Anion gap: 16 — ABNORMAL HIGH (ref 5–15)
BILIRUBIN TOTAL: 0.5 mg/dL (ref 0.3–1.2)
BUN: 65 mg/dL — AB (ref 6–20)
CALCIUM: 7.3 mg/dL — AB (ref 8.9–10.3)
CO2: 24 mmol/L (ref 22–32)
CREATININE: 10.56 mg/dL — AB (ref 0.61–1.24)
Chloride: 99 mmol/L — ABNORMAL LOW (ref 101–111)
GFR calc Af Amer: 5 mL/min — ABNORMAL LOW (ref >60)
GFR, EST NON AFRICAN AMERICAN: 5 mL/min — AB (ref >60)
Glucose, Bld: 157 mg/dL — ABNORMAL HIGH (ref 65–99)
POTASSIUM: 3.4 mmol/L — AB (ref 3.5–5.1)
Sodium: 139 mmol/L (ref 135–145)
TOTAL PROTEIN: 6 g/dL — AB (ref 6.5–8.1)

## 2015-08-13 NOTE — Progress Notes (Signed)
[facility] PROGRESS NOTE  Micheal Gomez SEG:[id number] DOB: Jun 21, 1956 DOA: 08/05/2015 PCP: No primary care provider on file.  Brief narrative 60 year old male with history of hypertension, diabetes (recently diagnosed but not treated), recently diagnosed OSA and started on CPAP was found on the floor by his unsafe for altered mental status. He was brought to the ED where he was hypotensive with encephalopathy, had severe metabolic acidosis with positive beta hydroxybutyric acid . CBG was >600 blood work showed severe and gap acidosis with blood glucose of 1500. He also had acute kidney injury with BUN of 103 and creatinine of 6.48. Lactic acid was elevated to 3.93.  UA Showed hematuria, glycosuria with many bacteria. Also had rhabdomyolysis. Patient admitted to ICU and started on glucose stabilizer. Nephrology was consulted and patient started on temporary hemodialysis. DKA and metabolic acidosis resolved and patient transfer to stepdown unit.    Assessment/Plan: DKA in the setting of uncontrolled diabetes mellitus A1c of 15.8. Required insulin drip on admission. Now started on insulin. Continue diabetic education. FSG well controlled. Patient reported when he followed at [facility] clinic that he was borderline diabetic.  FSG stable after Lantus dose increased to 16 units daily. Continue sliding scale coverage.  Acute kidney injury/ATN requiring temporary hemodialysis.  Presumed acute kidney injury in the setting of DKA. Compliments and serologies negative. Has good urine output. Foley discontinued . -Last dialysis on 2/10. Recorded urine output of 500 mL past 24 hours. Continue aggressive diuresis with Lasix drip. Monitor strict I/O. Monitor lites and renal function daily.  Rhabdomyolysis Possibly in the setting ATN and severe dehydration. CK improving.   Essential hypertension Continue Lasix drip. Added amlodipine.  Acute metabolic encephalopathy In the setting of severe DKA and?  Uremia. Now resolved.  Transaminitis ? Associated with rhabdomyolysis. Acute hepatitis panel negative. Liver ultrasound shows fatty changes only. LFTs slightly improving.  thrombocytopenia No clear etiology? Associated with rhabdo. Avoid heparin products.  Macrocytic anemia Low iron saturation noted.   Diet: Dysphagia level III  Code Status: Full code  Family Communication: None at bedside  Disposition Plan: Home once renal function improved.  Consultants: Renal PC CM   Procedures: HD catheter placement Abdominal ultrasound   Antibiotics:  none  HPI/Subjective: Seen and examined. Denies any specific symptoms.    Objective: Filed Vitals:   08/13/15 0532 08/13/15 0902  BP: 145/80 107/62  Pulse: 96 89  Temp: 98 F (36.7 C) 97.8 F (36.6 C)  Resp: 16 16    Intake/Output Summary (Last 24 hours) at 08/13/15 1343 Last data filed at 08/13/15 0811  Gross per 24 hour  Intake   1062 ml  Output   2672 ml  Net  -1610 ml   Filed Weights   08/12/15 1538 08/12/15 2035 08/13/15 0239  Weight: 119.3 kg (263 lb 0.1 oz) 122.29 kg (269 lb 9.6 oz) 122.29 kg (269 lb 9.6 oz)    Exam:   General: not in distress  HEENT:  moist mucosa, right IJ  Chest: Clear bilaterally  CVS: Normal S1 and S2, no murmurs rub or gallop  GI: Soft, nondistended, nontender, bowel sounds present  Musculoskeletal: Warm, 1+ pitting edema bilaterally     Data Reviewed: Basic Metabolic Panel:  Recent Labs Lab 08/10/15 0435 08/10/15 0803 08/11/15 0940 08/12/15 0500 08/13/15 0503  NA 141 138 139 140 139  K 3.9 4.6 4.5 4.1 3.4*  CL 103 102 101 101 99*  CO2 25 19* '22 22 24  '$ GLUCOSE 149* 196* 284* 220* 157*  BUN 75* 80* 98* 109* 65*  CREATININE 9.88* 10.34* 12.99* 14.01* 10.56*  CALCIUM 6.8* 6.7* 7.0* 7.0* 7.3*  MG  --  2.1  --   --   --   PHOS 3.6  --   --  7.4*  --    Liver Function Tests:  Recent Labs Lab 08/10/15 0435 08/11/15 0940 08/12/15 0500 08/13/15 0503  AST   --  113*  --  63*  ALT  --  166*  --  123*  ALKPHOS  --  128*  --  148*  BILITOT  --  1.1  --  0.5  PROT  --  6.0*  --  6.0*  ALBUMIN 2.2* 2.5* 2.3* 2.6*   No results for input(s): LIPASE, AMYLASE in the last 168 hours. No results for input(s): AMMONIA in the last 168 hours. CBC:  Recent Labs Lab 08/09/15 0700 08/09/15 1446 08/11/15 0940 08/13/15 0503  WBC 6.3 6.0 6.0 5.9  NEUTROABS  --  4.4  --   --   HGB 10.4* 10.0* 9.8* 9.8*  HCT 30.9* 30.6* 30.4* 29.4*  MCV 79.4 79.3 80.0 80.1  PLT 44* 42* 78* 133*   Cardiac Enzymes:  Recent Labs Lab 08/10/15 0803 08/11/15 0940 08/12/15 0500  CKTOTAL 5703* 3084* 1781*   BNP (last 3 results) No results for input(s): BNP in the last 8760 hours.  ProBNP (last 3 results) No results for input(s): PROBNP in the last 8760 hours.  CBG:  Recent Labs Lab 08/12/15 1224 08/12/15 1714 08/12/15 2028 08/13/15 0721 08/13/15 1127  GLUCAP 230* 139* 160* 147* 235*    Recent Results (from the past 240 hour(s))  Blood Culture (routine x 2)     Status: None   Collection Time: 08/05/15 10:50 AM  Result Value Ref Range Status   Specimen Description BLOOD RIGHT ANTECUBITAL  Final   Special Requests BOTTLES DRAWN AEROBIC AND ANAEROBIC  Final   Culture   Final    NO GROWTH 5 DAYS Performed at [facility]    Report Status 08/10/2015 FINAL  Final  Blood Culture (routine x 2)     Status: None   Collection Time: 08/05/15 11:20 AM  Result Value Ref Range Status   Specimen Description BLOOD BLOOD RIGHT HAND  Final   Special Requests IN PEDIATRIC BOTTLE 1CC  Final   Culture   Final    NO GROWTH 5 DAYS Performed at [facility]    Report Status 08/10/2015 FINAL  Final  Urine culture     Status: None   Collection Time: 08/05/15 12:05 PM  Result Value Ref Range Status   Specimen Description URINE, CATHETERIZED  Final   Special Requests NONE  Final   Culture   Final    NO GROWTH 1 DAY Performed at [facility]     Report Status 08/06/2015 FINAL  Final  MRSA PCR Screening     Status: None   Collection Time: 08/05/15  2:44 PM  Result Value Ref Range Status   MRSA by PCR NEGATIVE NEGATIVE Final    Comment:        The GeneXpert MRSA Assay (FDA approved for NASAL specimens only), is one component of a comprehensive MRSA colonization surveillance program. It is not intended to diagnose MRSA infection nor to guide or monitor treatment for MRSA infections.      Studies: US Abdomen Complete  08/11/2015  CLINICAL DATA:  Transaminitis. EXAM: ABDOMEN ULTRASOUND COMPLETE COMPARISON:  None. FINDINGS: Gallbladder: No gallstones  or wall thickening visualized. No sonographic Murphy sign noted by sonographer. Common bile duct: Diameter: 3.6 mm Liver: Diffusely increased echogenicity with decreased through transmission of the sound beam. No focal mass or lesion. IVC: No abnormality visualized. Pancreas: Not well visualized. Spleen: Size and appearance within normal limits. Right Kidney: Length: 11.7 cm. Echogenicity within normal limits. No mass or hydronephrosis visualized. Left Kidney: Length: 11.1 cm. Echogenicity within normal limits. No mass or hydronephrosis visualized. Abdominal aorta: No aneurysm visualized. Other findings: None. IMPRESSION: 1. No acute findings. Normal gallbladder with no bile duct dilation. 2. Diffuse hepatic steatosis. Electronically Signed   By: Lajean Manes M.D.   On: 08/11/2015 20:21    Scheduled Meds: . amLODipine  5 mg Oral Daily  . antiseptic oral rinse  7 mL Mouth Rinse q12n4p  . chlorhexidine  15 mL Mouth Rinse BID  . furosemide  160 mg Intravenous Q6H  . insulin aspart  0-15 Units Subcutaneous TID WC  . insulin aspart  0-5 Units Subcutaneous QHS  . insulin glargine  16 Units Subcutaneous Daily  . insulin starter kit- pen needles  1 kit Other Once  . living well with diabetes book   Does not apply Once  . pantoprazole  40 mg Oral BID   Continuous Infusions: . sodium  chloride 10 mL/hr at 08/10/15 0854      Time spent: 25 minutes    Micheal Gomez  [facility] [phone]. If 7PM-7AM, please contact night-coverage at [email or web address], password [facility] 08/13/2015, 1:43 PM  LOS: 8 days

## 2015-08-13 NOTE — Progress Notes (Signed)
Speech Language Pathology Treatment: Dysphagia  Patient Details Name: Micheal Gomez MRN: [id number] DOB: 1956/01/12 Today's Date: 08/13/2015 Time: [id number] SLP Time Calculation (min) (ACUTE ONLY): 12 min  Assessment / Plan / Recommendation Clinical Impression  Pt seen with advanced PO trials this morning. Thin liquids elicit a strong, immediate cough response suggestive of aspiration. Pt has reduced awareness, attributing coughing with sips of water to a "bad taste in his mouth" from the coffee he drank earlier. Coughing was eliminated when drinks were thickened to nectar thick. Soft solids and purees were consumed without incidence. Recommend advancement to Dys 3 textures while continuing nectar thick liquids.   HPI HPI: 60 y/o male with "pre-diabetes", HTN, OSA admitted on 2/4 with acute encephalopathy and DKA with AKI and severe hypernatremia.      SLP Plan  Continue with current plan of care     Recommendations  Diet recommendations: Dysphagia 3 (mechanical soft);Nectar-thick liquid Liquids provided via: Cup;Straw Medication Administration: Whole meds with puree Supervision: Patient able to self feed Compensations: Slow rate;Small sips/bites Postural Changes and/or Swallow Maneuvers: Seated upright 90 degrees;Upright 30-60 min after meal;Out of bed for meals             Oral Care Recommendations: Oral care BID Follow up Recommendations: 24 hour supervision/assistance Plan: Continue with current plan of care     GO               Micheal Gomez, M.A. CCC-SLP [phone]  Micheal Gomez 08/13/2015, 8:51 AM

## 2015-08-13 NOTE — Progress Notes (Signed)
S:  No new CO O:BP 145/80 mmHg  Pulse 96  Temp(Src) 98 F (36.7 C) (Oral)  Resp 16  Ht _0  (1.753 m)  Wt 122.29 kg (269 lb 9.6 oz)  BMI 39.79 kg/m2  SpO2 97%  Intake/Output Summary (Last 24 hours) at 08/13/15 0809 Last data filed at 08/13/15 0600  Gross per 24 hour  Intake    942 ml  Output   2272 ml  Net  -1330 ml   Weight change: -3.1 kg (-6 lb 13.3 oz) TMA:UQJFH and alert CVS:RRR Resp: clear Abd:+ BS NTND No HSM Ext: no edema NEURO: CNI Ox3 no asterixis Rt IJ temp cath   . amLODipine  5 mg Oral Daily  . antiseptic oral rinse  7 mL Mouth Rinse q12n4p  . chlorhexidine  15 mL Mouth Rinse BID  . furosemide  160 mg Intravenous Q6H  . insulin aspart  0-15 Units Subcutaneous TID WC  . insulin aspart  0-5 Units Subcutaneous QHS  . insulin glargine  16 Units Subcutaneous Daily  . insulin starter kit- pen needles  1 kit Other Once  . living well with diabetes book   Does not apply Once  . pantoprazole  40 mg Oral BID   US Abdomen Complete  08/11/2015  CLINICAL DATA:  Transaminitis. EXAM: ABDOMEN ULTRASOUND COMPLETE COMPARISON:  None. FINDINGS: Gallbladder: No gallstones or wall thickening visualized. No sonographic Murphy sign noted by sonographer. Common bile duct: Diameter: 3.6 mm Liver: Diffusely increased echogenicity with decreased through transmission of the sound beam. No focal mass or lesion. IVC: No abnormality visualized. Pancreas: Not well visualized. Spleen: Size and appearance within normal limits. Right Kidney: Length: 11.7 cm. Echogenicity within normal limits. No mass or hydronephrosis visualized. Left Kidney: Length: 11.1 cm. Echogenicity within normal limits. No mass or hydronephrosis visualized. Abdominal aorta: No aneurysm visualized. Other findings: None. IMPRESSION: 1. No acute findings. Normal gallbladder with no bile duct dilation. 2. Diffuse hepatic steatosis. Electronically Signed   By: Lajean Manes M.D.   On: 08/11/2015 20:21   BMET    Component Value  Date/Time   NA 139 08/13/2015 0503   K 3.4* 08/13/2015 0503   CL 99* 08/13/2015 0503   CO2 24 08/13/2015 0503   GLUCOSE 157* 08/13/2015 0503   BUN 65* 08/13/2015 0503   CREATININE 10.56* 08/13/2015 0503   CALCIUM 7.3* 08/13/2015 0503   GFRNONAA 5* 08/13/2015 0503   GFRAA 5* 08/13/2015 0503   CBC    Component Value Date/Time   WBC 5.9 08/13/2015 0503   RBC 3.67* 08/13/2015 0503   RBC 3.86* 08/09/2015 1446   HGB 9.8* 08/13/2015 0503   HCT 29.4* 08/13/2015 0503   PLT 133* 08/13/2015 0503   MCV 80.1 08/13/2015 0503   MCH 26.7 08/13/2015 0503   MCHC 33.3 08/13/2015 0503   RDW 14.3 08/13/2015 0503   LYMPHSABS 1.1 08/09/2015 1446   MONOABS 0.4 08/09/2015 1446   EOSABS 0.1 08/09/2015 1446   BASOSABS 0.0 08/09/2015 1446     Assessment:  1. Presumably ARF in face of DKA with ATN though he has microhematuria of ? significance. (Scr 1.15 in 8/16).  Comps and serologies Neg.  No sure recorded UO is accurate 2. DKA 3. HTN 4. Hypernatremia, resolved 5. Thrombocytopenia  Plan: 1. DO NOT replace K yet 2. Daily labs  Macarena Langseth T

## 2015-08-14 LAB — GLUCOSE, CAPILLARY
GLUCOSE-CAPILLARY: 162 mg/dL — AB (ref 65–99)
GLUCOSE-CAPILLARY: 199 mg/dL — AB (ref 65–99)
Glucose-Capillary: 144 mg/dL — ABNORMAL HIGH (ref 65–99)
Glucose-Capillary: 168 mg/dL — ABNORMAL HIGH (ref 65–99)

## 2015-08-14 LAB — RENAL FUNCTION PANEL
ALBUMIN: 2.6 g/dL — AB (ref 3.5–5.0)
ANION GAP: 19 — AB (ref 5–15)
BUN: 77 mg/dL — ABNORMAL HIGH (ref 6–20)
CO2: 23 mmol/L (ref 22–32)
Calcium: 7.4 mg/dL — ABNORMAL LOW (ref 8.9–10.3)
Chloride: 98 mmol/L — ABNORMAL LOW (ref 101–111)
Creatinine, Ser: 12.08 mg/dL — ABNORMAL HIGH (ref 0.61–1.24)
GFR, EST AFRICAN AMERICAN: 5 mL/min — AB (ref >60)
GFR, EST NON AFRICAN AMERICAN: 4 mL/min — AB (ref >60)
Glucose, Bld: 195 mg/dL — ABNORMAL HIGH (ref 65–99)
PHOSPHORUS: 9.6 mg/dL — AB (ref 2.5–4.6)
POTASSIUM: 3.5 mmol/L (ref 3.5–5.1)
Sodium: 140 mmol/L (ref 135–145)

## 2015-08-14 LAB — CK: Total CK: 485 U/L — ABNORMAL HIGH (ref 49–397)

## 2015-08-14 LAB — HEPATIC FUNCTION PANEL
ALBUMIN: 2.6 g/dL — AB (ref 3.5–5.0)
ALK PHOS: 143 U/L — AB (ref 38–126)
ALT: 94 U/L — ABNORMAL HIGH (ref 17–63)
AST: 44 U/L — ABNORMAL HIGH (ref 15–41)
BILIRUBIN INDIRECT: 0.4 mg/dL (ref 0.3–0.9)
Bilirubin, Direct: 0.2 mg/dL (ref 0.1–0.5)
TOTAL PROTEIN: 6.2 g/dL — AB (ref 6.5–8.1)
Total Bilirubin: 0.6 mg/dL (ref 0.3–1.2)

## 2015-08-14 NOTE — Progress Notes (Signed)
Pt. placed on CPAP for h/s use, on room air, no c/o, RT to monitor.

## 2015-08-14 NOTE — Progress Notes (Signed)
S:  No new CO.  Denies uremic Sxs O:BP 140/60 mmHg  Pulse 95  Temp(Src) 98 F (36.7 C) (Oral)  Resp 16  Ht _0  (1.753 m)  Wt 122.29 kg (269 lb 9.6 oz)  BMI 39.79 kg/m2  SpO2 98%  Intake/Output Summary (Last 24 hours) at 08/14/15 0843 Last data filed at 08/14/15 0600  Gross per 24 hour  Intake    852 ml  Output    625 ml  Net    227 ml   Weight change: 0.19 kg (6.7 oz) APO:LIDCV and alert CVS:RRR Resp: clear Abd:+ BS NTND No HSM Ext: no edema NEURO: CNI Ox3 no asterixis Rt IJ temp cath   . amLODipine  5 mg Oral Daily  . antiseptic oral rinse  7 mL Mouth Rinse q12n4p  . chlorhexidine  15 mL Mouth Rinse BID  . furosemide  160 mg Intravenous Q6H  . insulin aspart  0-15 Units Subcutaneous TID WC  . insulin aspart  0-5 Units Subcutaneous QHS  . insulin glargine  16 Units Subcutaneous Daily  . insulin starter kit- pen needles  1 kit Other Once  . living well with diabetes book   Does not apply Once  . pantoprazole  40 mg Oral BID   No results found. BMET    Component Value Date/Time   NA 140 08/14/2015 0520   K 3.5 08/14/2015 0520   CL 98* 08/14/2015 0520   CO2 23 08/14/2015 0520   GLUCOSE 195* 08/14/2015 0520   BUN 77* 08/14/2015 0520   CREATININE 12.08* 08/14/2015 0520   CALCIUM 7.4* 08/14/2015 0520   GFRNONAA 4* 08/14/2015 0520   GFRAA 5* 08/14/2015 0520   CBC    Component Value Date/Time   WBC 5.9 08/13/2015 0503   RBC 3.67* 08/13/2015 0503   RBC 3.86* 08/09/2015 1446   HGB 9.8* 08/13/2015 0503   HCT 29.4* 08/13/2015 0503   PLT 133* 08/13/2015 0503   MCV 80.1 08/13/2015 0503   MCH 26.7 08/13/2015 0503   MCHC 33.3 08/13/2015 0503   RDW 14.3 08/13/2015 0503   LYMPHSABS 1.1 08/09/2015 1446   MONOABS 0.4 08/09/2015 1446   EOSABS 0.1 08/09/2015 1446   BASOSABS 0.0 08/09/2015 1446     Assessment:  1. Presumably ARF in face of DKA with ATN though he has microhematuria of ? significance. (Scr 1.15 in 8/16).  Comps and serologies Neg.  UO remains  inaccurate but does appear to have increased yest 2. DKA 3. HTN 4. Hypernatremia, resolved 5. Thrombocytopenia, improving  Plan: 1. Will follow Scr and decide on further HD.  UO appears to be improving and I expect renal fx to recover.  Will decide on further HD day to day 2. Daily labs  3. Cont IV lasix 4. Spoke with nurse about getting accurate output Shatasia Cutshaw T

## 2015-08-14 NOTE — Progress Notes (Signed)
RT placed patient on CPAP HS. Patient is tolerating well. No O2 bleed in needed. RT will continue to monitor as needed.

## 2015-08-14 NOTE — Progress Notes (Signed)
[facility] PROGRESS NOTE  Micheal Gomez YHC:[id number] DOB: 1955/07/16 DOA: 08/05/2015 PCP: No primary care provider on file.  Brief narrative 60 year old male with history of hypertension, diabetes (recently diagnosed but not treated), recently diagnosed OSA and started on CPAP was found on the floor by his unsafe for altered mental status. He was brought to the ED where he was hypotensive with encephalopathy, had severe metabolic acidosis with positive beta hydroxybutyric acid . CBG was >600 blood work showed severe and gap acidosis with blood glucose of 1500. He also had acute kidney injury with BUN of 103 and creatinine of 6.48. Lactic acid was elevated to 3.93.  UA Showed hematuria, glycosuria with many bacteria. Also had rhabdomyolysis. Patient admitted to ICU and started on glucose stabilizer. Nephrology was consulted and patient started on temporary hemodialysis. DKA and metabolic acidosis resolved and patient transfer to stepdown unit.    Assessment/Plan: DKA in the setting of uncontrolled diabetes mellitus A1c of 15.8. Required insulin drip on admission. Now started on insulin. Continue diabetic education.  Patient reported when he followed at [facility] clinic that he was borderline diabetic.  FSG stable after Lantus dose increased to 16 units daily. Continue sliding scale coverage.  Acute kidney injury/ATN requiring temporary hemodialysis.  Presumed acute kidney injury in the setting of DKA. Compliments and serologies negative.  -Last dialysis on 2/10. No output has not been properly recorded. -On Lasix drip. Appreciate renal follow-up. Will evaluate on daily basis for hemodialysis need. Monitor strict I/O.  Rhabdomyolysis Possibly in the setting ATN and severe dehydration. CK improving.   Essential hypertension Continue Lasix drip. Added amlodipine.  Acute metabolic encephalopathy In the setting of severe DKA and? Uremia. Now resolved.  Transaminitis ? Associated with  rhabdomyolysis. Acute hepatitis panel negative. Liver ultrasound shows fatty changes only. LFTs improving.  thrombocytopenia No clear etiology? Associated with rhabdo. Avoid heparin products.  Macrocytic anemia Low iron saturation noted.   Diet: Dysphagia level III  Code Status: Full code  Family Communication: None at bedside  Disposition Plan: Home once renal function improved.  Consultants: Renal PC CM   Procedures: HD catheter placement Abdominal ultrasound   Antibiotics:  none  HPI/Subjective: Seen and examined. No overnight issues.    Objective: Filed Vitals:   08/14/15 0111 08/14/15 0603  BP:  140/60  Pulse: 88 95  Temp:  98 F (36.7 C)  Resp: 16 16    Intake/Output Summary (Last 24 hours) at 08/14/15 1117 Last data filed at 08/14/15 0600  Gross per 24 hour  Intake    852 ml  Output    625 ml  Net    227 ml   Filed Weights   08/12/15 2035 08/13/15 0239 08/14/15 0401  Weight: 122.29 kg (269 lb 9.6 oz) 122.29 kg (269 lb 9.6 oz) 122.29 kg (269 lb 9.6 oz)    Exam:   General: not in distress  HEENT:  moist mucosa, right IJ  Chest: Clear bilaterally  CVS: Normal S1 and S2, no murmurs rub or gallop  GI: Soft, nondistended, nontender, bowel sounds present  Musculoskeletal: Warm, 1+ pitting edema bilaterally     Data Reviewed: Basic Metabolic Panel:  Recent Labs Lab 08/10/15 0435 08/10/15 0803 08/11/15 0940 08/12/15 0500 08/13/15 0503 08/14/15 0520  NA 141 138 139 140 139 140  K 3.9 4.6 4.5 4.1 3.4* 3.5  CL 103 102 101 101 99* 98*  CO2 25 19* '22 22 24 23  '$ GLUCOSE 149* 196* 284* 220* 157* 195*  BUN 75*  80* 98* 109* 65* 77*  CREATININE 9.88* 10.34* 12.99* 14.01* 10.56* 12.08*  CALCIUM 6.8* 6.7* 7.0* 7.0* 7.3* 7.4*  MG  --  2.1  --   --   --   --   PHOS 3.6  --   --  7.4*  --  9.6*   Liver Function Tests:  Recent Labs Lab 08/11/15 0940 08/12/15 0500 08/13/15 0503 08/14/15 0519 08/14/15 0520  AST 113*  --  63* 44*  --    ALT 166*  --  123* 94*  --   ALKPHOS 128*  --  148* 143*  --   BILITOT 1.1  --  0.5 0.6  --   PROT 6.0*  --  6.0* 6.2*  --   ALBUMIN 2.5* 2.3* 2.6* 2.6* 2.6*   No results for input(s): LIPASE, AMYLASE in the last 168 hours. No results for input(s): AMMONIA in the last 168 hours. CBC:  Recent Labs Lab 08/09/15 0700 08/09/15 1446 08/11/15 0940 08/13/15 0503  WBC 6.3 6.0 6.0 5.9  NEUTROABS  --  4.4  --   --   HGB 10.4* 10.0* 9.8* 9.8*  HCT 30.9* 30.6* 30.4* 29.4*  MCV 79.4 79.3 80.0 80.1  PLT 44* 42* 78* 133*   Cardiac Enzymes:  Recent Labs Lab 08/10/15 0803 08/11/15 0940 08/12/15 0500 08/14/15 0519  CKTOTAL 5703* 3084* 1781* 485*   BNP (last 3 results) No results for input(s): BNP in the last 8760 hours.  ProBNP (last 3 results) No results for input(s): PROBNP in the last 8760 hours.  CBG:  Recent Labs Lab 08/13/15 0721 08/13/15 1127 08/13/15 1636 08/13/15 2322 08/14/15 0744  GLUCAP 147* 235* 168* 155* 162*    Recent Results (from the past 240 hour(s))  Blood Culture (routine x 2)     Status: None   Collection Time: 08/05/15 10:50 AM  Result Value Ref Range Status   Specimen Description BLOOD RIGHT ANTECUBITAL  Final   Special Requests BOTTLES DRAWN AEROBIC AND ANAEROBIC 3ML  Final   Culture   Final    NO GROWTH 5 DAYS Performed at [facility]    Report Status 08/10/2015 FINAL  Final  Blood Culture (routine x 2)     Status: None   Collection Time: 08/05/15 11:20 AM  Result Value Ref Range Status   Specimen Description BLOOD BLOOD RIGHT HAND  Final   Special Requests IN PEDIATRIC BOTTLE [place]  Final   Culture   Final    NO GROWTH 5 DAYS Performed at [facility]    Report Status 08/10/2015 FINAL  Final  Urine culture     Status: None   Collection Time: 08/05/15 12:05 PM  Result Value Ref Range Status   Specimen Description URINE, CATHETERIZED  Final   Special Requests NONE  Final   Culture   Final    NO GROWTH 1 DAY Performed  at [facility]    Report Status 08/06/2015 FINAL  Final  MRSA PCR Screening     Status: None   Collection Time: 08/05/15  2:44 PM  Result Value Ref Range Status   MRSA by PCR NEGATIVE NEGATIVE Final    Comment:        The GeneXpert MRSA Assay (FDA approved for NASAL specimens only), is one component of a comprehensive MRSA colonization surveillance program. It is not intended to diagnose MRSA infection nor to guide or monitor treatment for MRSA infections.      Studies: No results found.  Scheduled  Meds: . amLODipine  5 mg Oral Daily  . antiseptic oral rinse  7 mL Mouth Rinse q12n4p  . chlorhexidine  15 mL Mouth Rinse BID  . furosemide  160 mg Intravenous Q6H  . insulin aspart  0-15 Units Subcutaneous TID WC  . insulin aspart  0-5 Units Subcutaneous QHS  . insulin glargine  16 Units Subcutaneous Daily  . insulin starter kit- pen needles  1 kit Other Once  . living well with diabetes book   Does not apply Once  . pantoprazole  40 mg Oral BID   Continuous Infusions: . sodium chloride 10 mL/hr at 08/10/15 0854      Time spent: 25 minutes    Micheal Gomez  [facility] VHQIT:[phone]. If 7PM-7AM, please contact night-coverage at [email or web address], password [facility] 08/14/2015, 11:17 AM  LOS: 9 days

## 2015-08-15 DIAGNOSIS — N171 Acute kidney failure with acute cortical necrosis: Secondary | ICD-10-CM

## 2015-08-15 LAB — RENAL FUNCTION PANEL
ALBUMIN: 2.6 g/dL — AB (ref 3.5–5.0)
ANION GAP: 20 — AB (ref 5–15)
BUN: 82 mg/dL — ABNORMAL HIGH (ref 6–20)
CO2: 21 mmol/L — ABNORMAL LOW (ref 22–32)
Calcium: 7.4 mg/dL — ABNORMAL LOW (ref 8.9–10.3)
Chloride: 96 mmol/L — ABNORMAL LOW (ref 101–111)
Creatinine, Ser: 12.75 mg/dL — ABNORMAL HIGH (ref 0.61–1.24)
GFR calc Af Amer: 4 mL/min — ABNORMAL LOW (ref >60)
GFR calc non Af Amer: 4 mL/min — ABNORMAL LOW (ref >60)
GLUCOSE: 232 mg/dL — AB (ref 65–99)
PHOSPHORUS: 9.8 mg/dL — AB (ref 2.5–4.6)
POTASSIUM: 3.4 mmol/L — AB (ref 3.5–5.1)
Sodium: 137 mmol/L (ref 135–145)

## 2015-08-15 LAB — GLUCOSE, CAPILLARY
GLUCOSE-CAPILLARY: 141 mg/dL — AB (ref 65–99)
Glucose-Capillary: 182 mg/dL — ABNORMAL HIGH (ref 65–99)
Glucose-Capillary: 162 mg/dL — ABNORMAL HIGH (ref 65–99)
Glucose-Capillary: 239 mg/dL — ABNORMAL HIGH (ref 65–99)

## 2015-08-15 MED ORDER — GLUCOSE BLOOD VI STRP: ORAL_STRIP | Status: AC

## 2015-08-15 MED ORDER — ALCOHOL SWABS 70 % PADS: 1.0000 "application " | MEDICATED_PAD | Freq: Every day | Status: DC

## 2015-08-15 MED ORDER — ZOLPIDEM TARTRATE 5 MG PO TABS
5.0000 mg | ORAL_TABLET | Freq: Every evening | ORAL | Status: DC | PRN
  Administered 2015-08-16 – 2015-08-20 (×7): 5 mg via ORAL
  Filled 2015-08-15 (×7): qty 1

## 2015-08-15 MED ORDER — FUROSEMIDE 80 MG PO TABS
160.0000 mg | ORAL_TABLET | Freq: Three times a day (TID) | ORAL | Status: DC
  Administered 2015-08-15 – 2015-08-16 (×2): 160 mg via ORAL
  Filled 2015-08-15 (×2): qty 2

## 2015-08-15 MED ORDER — "INSULIN SYRINGE 28G X 1/2"" 0.5 ML MISC": 1.0000 "application " | Freq: Every day | Status: AC

## 2015-08-15 NOTE — Progress Notes (Signed)
Occupational Therapy Treatment Patient Details Name: Micheal Gomez MRN: [id number] DOB: 1955/12/27 Today's Date: 08/15/2015    History of present illness 60 y/o male with "pre-diabetes", HTN, OSA admitted on 2/4 with acute encephalopathy and DKA with AKI and severe hypernatremia   OT comments  Pt. Able to complete simulated toileting tasks and LB dressing with S/min guard A.  Relies heavily on furniture walking during short distance ambulation without RW, and states he would not be using at home.  Educated on hand placement for safe pivot transfers and bed mobility.  Will continue to follow acutely.    Follow Up Recommendations  No OT follow up;Supervision/Assistance - 24 hour    Equipment Recommendations  3 in 1 bedside comode    Recommendations for Other Services      Precautions / Restrictions Precautions Precautions: Fall       Mobility Bed Mobility Overal bed mobility: Needs Assistance Bed Mobility: Rolling;Sidelying to Sit Rolling: Supervision Sidelying to sit: Supervision       General bed mobility comments: HOB flat, no rails, exited on left side of bed  Transfers Overall transfer level: Needs assistance Equipment used: 1 person hand held assist Transfers: Sit to/from [facility] Sit to Stand: Min guard Stand pivot transfers: Min guard       General transfer comment: refused/requested no RW use to "try it". utilizing furniture walking during mobility but reports he would not be using DME for ambulation at home    Balance                                   ADL Overall ADL's : Needs assistance/impaired                     Lower Body Dressing: Supervision/safety;Sitting/lateral leans Lower Body Dressing Details (indicate cue type and reason): able to don/doff socks, reaching forward in sitting and also crossing legs over knees.  declined need to review LB dressing secondary to stating he never wears underwear, and  just wears sweatpants if he leaves the house Toilet Transfer: Min guard;Ambulation Toilet Transfer Details (indicate cue type and reason): sim. amb. approx. 3 steps from eob to recliner.  requested no rw "just let me try, nursing let me try earlier to the b.room". pt. relied heavily on furniture walking but states he has no plans for amb. dme at home [place] and Hygiene: Supervision/safety;Sitting/lateral lean Toileting - Clothing Manipulation Details (indicate cue type and reason): simulated during transfers     Functional mobility during ADLs: Min guard        Vision                     Perception     Praxis      Cognition   Behavior During Therapy: [facility] for tasks assessed/performed Overall Cognitive Status: Within Functional Limits for tasks assessed       Memory: Decreased short-term memory               Extremity/Trunk Assessment               Exercises     Shoulder Instructions       General Comments      Pertinent Vitals/ Pain       Pain Assessment: No/denies pain  Home Living  Prior Functioning/Environment              Frequency Min 2X/week     Progress Toward Goals  OT Goals(current goals can now be found in the care plan section)  Progress towards OT goals: Progressing toward goals     Plan Discharge plan remains appropriate    Co-evaluation                 End of Session Equipment Utilized During Treatment: Gait belt   Activity Tolerance Patient tolerated treatment well   Patient Left in chair;with call bell/phone within reach;with chair alarm set   Nurse Communication Other (comment) (pt. requests ice water, checked for any fluid restrictions before administering)        Time: [id number] OT Time Calculation (min): 11 min  Charges: OT General Charges $OT Visit: 1 Procedure OT Treatments $Self Care/Home Management : 8-22  mins  Janice Coffin, COTA/L 08/15/2015, 12:38 PM

## 2015-08-15 NOTE — Progress Notes (Signed)
Assessment:  1. Presumably ARF in face of DKA with ATN though he has microhematuria of ? significance. (Scr 1.15 in 8/16). Comps and serologies Neg. UO remains inaccurate but does appear to have increased yest 2. DKA 3. HTN 4. Hypernatremia, resolved 5. Thrombocytopenia, improving  Plan: 1. Will follow Scr and decide on further HD. UO appears to be improving and I expect renal fx to recover. Will decide on further HD prn.  I think it will be OK to switch to PO furosemide 2. Daily labs   Subjective: Interval History: Feel;s ok  Objective: Vital signs in last 24 hours: Temp:  [97.9 F (36.6 C)-98.3 F (36.8 C)] 98 F (36.7 C) (02/13 0705) Pulse Rate:  [86-105] 86 (02/13 0705) Resp:  [18-20] 18 (02/13 0705) BP: (135-149)/(59-74) 135/59 mmHg (02/13 0705) SpO2:  [98 %-100 %] 100 % (02/13 0705) Weight:  [122.29 kg (269 lb 9.6 oz)] 122.29 kg (269 lb 9.6 oz) (02/13 0500) Weight change: 0 kg (0 lb)  Intake/Output from previous day: 02/12 0701 - 02/13 0700 In: 982 [P.O.:840; I.V.:10; IV Piggyback:132] Out: 3790 [Urine:2540; Stool:1] Intake/Output this shift: Total I/O In: 360 [P.O.:360] Out: 400 [Urine:400]  Head: Normocephalic, without obvious abnormality, atraumatic Back: negative, symmetric, no curvature. ROM normal. No CVA tenderness. Resp: clear to auscultation bilaterally Cardio: regular rate and rhythm, S1, S2 normal, no murmur, click, rub or gallop GI: protuberant w/o masses Extremities: extremities normal, atraumatic, no cyanosis or edema  Lab Results:  Recent Labs  08/13/15 0503  WBC 5.9  HGB 9.8*  HCT 29.4*  PLT 133*   BMET:  Recent Labs  08/14/15 0520 08/15/15 0501  NA 140 137  K 3.5 3.4*  CL 98* 96*  CO2 23 21*  GLUCOSE 195* 232*  BUN 77* 82*  CREATININE 12.08* 12.75*  CALCIUM 7.4* 7.4*   No results for input(s): PTH in the last 72 hours. Iron Studies: No results for input(s): IRON, TIBC, TRANSFERRIN, FERRITIN in the last 72  hours. Studies/Results: No results found.  Scheduled: . amLODipine  5 mg Oral Daily  . antiseptic oral rinse  7 mL Mouth Rinse q12n4p  . chlorhexidine  15 mL Mouth Rinse BID  . furosemide  160 mg Intravenous Q6H  . insulin aspart  0-15 Units Subcutaneous TID WC  . insulin aspart  0-5 Units Subcutaneous QHS  . insulin glargine  16 Units Subcutaneous Daily  . insulin starter kit- pen needles  1 kit Other Once  . living well with diabetes book   Does not apply Once  . pantoprazole  40 mg Oral BID     LOS: 10 days   Justyce Baby C 08/15/2015,12:25 PM

## 2015-08-15 NOTE — Progress Notes (Signed)
Speech Language Pathology Treatment: Dysphagia  Patient Details Name: Micheal Gomez MRN: [id number] DOB: [birth year] Today's Date: 08/15/2015 Time: 1420-1430 SLP Time Calculation (min) (ACUTE ONLY): 10 min  Assessment / Plan / Recommendation Clinical Impression  Improvements with pharyngeal swallow noted during trials of thin via cup and straw. Delayed cough x 1 over 10 min period not suspected due to airway compromise. Pt consumed approximately 3 oz without difficulty. Minimal verbal cues for small sips. SLP reviewed esophageal strategies such as alternate liquids/solids, stay up minimum 30 minutes after meals, decrease rate during meals. Recommend upgrade to regular and thin liquids/ No further ST needed at present.    HPI HPI: Micheal Gomez with "pre-diabetes", HTN, OSA admitted on 2/4 with acute encephalopathy and DKA with AKI and severe hypernatremia.      SLP Plan  All goals met;Discharge SLP treatment due to (comment)     Recommendations  Diet recommendations: Regular;Thin liquid Liquids provided via: Cup;Straw Medication Administration: Whole meds with puree Supervision: Patient able to self feed Compensations: Slow rate;Small sips/bites;Follow solids with liquid Postural Changes and/or Swallow Maneuvers: Seated upright 90 degrees;Upright 30-60 min after meal;Out of bed for meals             Oral Care Recommendations: Oral care BID Follow up Recommendations: None Plan: All goals met;Discharge SLP treatment due to (comment)                     [name] 08/15/2015, 2:34 PM  [name] Caroli.Ed [employer] [phone]

## 2015-08-15 NOTE — Progress Notes (Signed)
Physical Therapy Treatment Patient Details Name: Rhythm Alatorre MRN: [id number] DOB: Jul 05, 1955 Today's Date: 08/15/2015    History of Present Illness 60 y/o male with "pre-diabetes", HTN, OSA admitted on 2/4 with acute encephalopathy and DKA with AKI and severe hypernatremia    PT Comments    Pt is getting up to walk with PT on the RW having some fatigue from poor sleep.  However, he demonstrates better control of the walker than last PT apt, will anticipate dc home to be reasonable with his plan.  Pt will try to be up with nursing staff for extra walks after resting.  Follow Up Recommendations  Supervision for mobility/OOB;Home health PT     Equipment Recommendations  Rolling walker with 5" wheels    Recommendations for Other Services       Precautions / Restrictions Precautions Precautions: Fall    Mobility  Bed Mobility Overal bed mobility: Modified Independent (min guard for safety) Bed Mobility: Supine to Sit;Sit to Supine     Supine to sit: Supervision (for safety) Sit to supine: Supervision (for safety)      Transfers Overall transfer level: Needs assistance Equipment used: 1 person hand held assist Transfers: Sit to/from [facility] Sit to Stand: Min guard (on RW) Stand pivot transfers: Min guard       General transfer comment: was able to control on RW better today, as he is corfortable with support and is steadier  Ambulation/Gait Ambulation/Gait assistance: Min guard Ambulation Distance (Feet): 150 Feet Assistive device: Rolling walker (2 wheeled) Gait Pattern/deviations: Wide base of support;Decreased stride length (slow wide turns) Gait velocity: decreased Gait velocity interpretation: Below normal speed for age/gender General Gait Details: Did not try nods with head, pt concerned about how dizzy this made him feel last time.   Stairs            Wheelchair Mobility    Modified Rankin (Stroke Patients Only)        Balance   Sitting-balance support: Feet supported Sitting balance-Leahy Scale: Good       Standing balance-Leahy Scale: Fair                      Cognition Arousal/Alertness: Awake/alert Behavior During Therapy: WFL for tasks assessed/performed Overall Cognitive Status: Within Functional Limits for tasks assessed                      Exercises      General Comments General comments (skin integrity, edema, etc.): Pt is steadier on his walker today than last visit and did agree to use with no reservations, although he  is quite tired and may not be thinking about objecting therefore      Pertinent Vitals/Pain Pain Assessment: No/denies pain    Home Living                      Prior Function            PT Goals (current goals can now be found in the care plan section) Acute Rehab PT Goals Patient Stated Goal: Go home Progress towards PT goals: Progressing toward goals    Frequency  Min 3X/week    PT Plan Current plan remains appropriate    Co-evaluation             End of Session Equipment Utilized During Treatment: Gait belt Activity Tolerance: Patient tolerated treatment well Patient left: in bed;with call bell/phone within reach;with bed  alarm set     Time: [id number] PT Time Calculation (min) (ACUTE ONLY): 27 min  Charges:  $Gait Training: 8-22 mins $Therapeutic Activity: 8-22 mins                    G Codes:      Ramond Dial 08/26/15, 5:39 PM   Mee Hives, PT MS Acute Rehab Dept. Number: [phone] [phone] and [place] [phone]

## 2015-08-15 NOTE — Progress Notes (Signed)
[facility] PROGRESS NOTE  Micheal Gomez VEL:[id number] DOB: 03/18/1956 DOA: 08/05/2015 PCP: No primary care provider on file.  Brief narrative 60 year old male with history of hypertension, diabetes (recently diagnosed but not treated), recently diagnosed OSA and started on CPAP was found on the floor by his unsafe for altered mental status. He was brought to the ED where he was hypotensive with encephalopathy, had severe metabolic acidosis with positive beta hydroxybutyric acid . CBG was >600 blood work showed severe and gap acidosis with blood glucose of 1500. He also had acute kidney injury with BUN of 103 and creatinine of 6.48. Lactic acid was elevated to 3.93.  UA Showed hematuria, glycosuria with many bacteria. Also had rhabdomyolysis. Patient admitted to ICU and started on glucose stabilizer. Nephrology was consulted and patient started on temporary hemodialysis. DKA and metabolic acidosis resolved and patient transfer to stepdown unit.    Assessment/Plan: DKA in the setting of uncontrolled diabetes mellitus A1c of 15.8. Required insulin drip on admission. Now started on insulin. Continue diabetic education.  Patient reported when he followed at [facility] clinic that he was borderline diabetic.  FSG improved after Lantus dose increased to 16 units daily. Will increase further to 18 units daily. Continue sliding scale coverage. Will provide prescription to the fianc for glucometer, lancets, estrogen insulin syringe for her to fill them up at the [place]. Will prescribe insulin upon discharge (depending upon how much he would need)  Acute kidney injury/ATN requiring temporary hemodialysis.  Presumed acute kidney injury in the setting of DKA. Compliments and serologies negative.  -Last dialysis on 2/10.  -On Lasix drip with good urine output. (2.5 L past 24 hours). Still has positive balance of almost a liter since admission.  Appreciate renal follow-up. Will evaluate on daily basis for  hemodialysis need. Monitor strict I/O.  Rhabdomyolysis Possibly in the setting ATN and severe dehydration. CK improving.   Essential hypertension Continue Lasix drip. Added amlodipine.  Acute metabolic encephalopathy In the setting of severe DKA and? Uremia. Now resolved.  Transaminitis ? Associated with rhabdomyolysis. Acute hepatitis panel negative. Liver ultrasound shows fatty changes only. LFTs improving.  thrombocytopenia No clear etiology? Associated with rhabdo. Avoid heparin products.  Macrocytic anemia Low iron saturation noted.   Diet: Dysphagia level III  Code Status: Full code  Family Communication: Fianc at bedside  Disposition Plan: Home once renal function improved and no further dialysis needs.. Possibly later this week.  Consultants: Renal PC CM   Procedures: HD catheter placement Abdominal ultrasound   Antibiotics:  none  HPI/Subjective: Seen and examined. Reports feeling well.    Objective: Filed Vitals:   08/14/15 2000 08/15/15 0705  BP: 138/68 135/59  Pulse: 95 86  Temp: 97.9 F (36.6 C) 98 F (36.7 C)  Resp: 20 18    Intake/Output Summary (Last 24 hours) at 08/15/15 1333 Last data filed at 08/15/15 0933  Gross per 24 hour  Intake    982 ml  Output   2166 ml  Net  -1184 ml   Filed Weights   08/13/15 0239 08/14/15 0401 08/15/15 0500  Weight: 122.29 kg (269 lb 9.6 oz) 122.29 kg (269 lb 9.6 oz) 122.29 kg (269 lb 9.6 oz)    Exam:   General: not in distress  HEENT:  moist mucosa, right IJ  Chest: Clear bilaterally  CVS: Normal S1 and S2, no murmurs rub or gallop  GI: Soft, nondistended, nontender, bowel sounds present  Musculoskeletal: Warm, trace pitting edema bilaterally     Data  Reviewed: Basic Metabolic Panel:  Recent Labs Lab 08/10/15 0435 08/10/15 0803 08/11/15 0940 08/12/15 0500 08/13/15 0503 08/14/15 0520 08/15/15 0501  NA 141 138 139 140 139 140 137  K 3.9 4.6 4.5 4.1 3.4* 3.5 3.4*  CL 103  102 101 101 99* 98* 96*  CO2 25 19* _0 21*  GLUCOSE 149* 196* 284* 220* 157* 195* 232*  BUN 75* 80* 98* 109* 65* 77* 82*  CREATININE 9.88* 10.34* 12.99* 14.01* 10.56* 12.08* 12.75*  CALCIUM 6.8* 6.7* 7.0* 7.0* 7.3* 7.4* 7.4*  MG  --  2.1  --   --   --   --   --   PHOS 3.6  --   --  7.4*  --  9.6* 9.8*   Liver Function Tests:  Recent Labs Lab 08/11/15 0940 08/12/15 0500 08/13/15 0503 08/14/15 0519 08/14/15 0520 08/15/15 0501  AST 113*  --  63* 44*  --   --   ALT 166*  --  123* 94*  --   --   ALKPHOS 128*  --  148* 143*  --   --   BILITOT 1.1  --  0.5 0.6  --   --   PROT 6.0*  --  6.0* 6.2*  --   --   ALBUMIN 2.5* 2.3* 2.6* 2.6* 2.6* 2.6*   No results for input(s): LIPASE, AMYLASE in the last 168 hours. No results for input(s): AMMONIA in the last 168 hours. CBC:  Recent Labs Lab 08/09/15 0700 08/09/15 1446 08/11/15 0940 08/13/15 0503  WBC 6.3 6.0 6.0 5.9  NEUTROABS  --  4.4  --   --   HGB 10.4* 10.0* 9.8* 9.8*  HCT 30.9* 30.6* 30.4* 29.4*  MCV 79.4 79.3 80.0 80.1  PLT 44* 42* 78* 133*   Cardiac Enzymes:  Recent Labs Lab 08/10/15 0803 08/11/15 0940 08/12/15 0500 08/14/15 0519  CKTOTAL 5703* 3084* 1781* 485*   BNP (last 3 results) No results for input(s): BNP in the last 8760 hours.  ProBNP (last 3 results) No results for input(s): PROBNP in the last 8760 hours.  CBG:  Recent Labs Lab 08/14/15 1110 08/14/15 1630 08/14/15 2146 08/15/15 0811 08/15/15 1138  GLUCAP 199* 144* 168* 182* 162*    Recent Results (from the past 240 hour(s))  MRSA PCR Screening     Status: None   Collection Time: 08/05/15  2:44 PM  Result Value Ref Range Status   MRSA by PCR NEGATIVE NEGATIVE Final    Comment:        The GeneXpert MRSA Assay (FDA approved for NASAL specimens only), is one component of a comprehensive MRSA colonization surveillance program. It is not intended to diagnose MRSA infection nor to guide or monitor treatment for MRSA  infections.      Studies: No results found.  Scheduled Meds: . amLODipine  5 mg Oral Daily  . antiseptic oral rinse  7 mL Mouth Rinse q12n4p  . chlorhexidine  15 mL Mouth Rinse BID  . furosemide  160 mg Intravenous Q6H  . insulin aspart  0-15 Units Subcutaneous TID WC  . insulin aspart  0-5 Units Subcutaneous QHS  . insulin glargine  16 Units Subcutaneous Daily  . insulin starter kit- pen needles  1 kit Other Once  . living well with diabetes book   Does not apply Once  . pantoprazole  40 mg Oral BID   Continuous Infusions: . sodium chloride 10 mL/hr at 08/10/15 8185  Time spent: 25 minutes    Antonea Gaut  [facility] Pager:[phone]. If 7PM-7AM, please contact night-coverage at [email or web address], password [facility] 08/15/2015, 1:33 PM  LOS: 10 days

## 2015-08-16 LAB — COMPREHENSIVE METABOLIC PANEL
ALBUMIN: 2.7 g/dL — AB (ref 3.5–5.0)
ALT: 61 U/L (ref 17–63)
ANION GAP: 21 — AB (ref 5–15)
AST: 23 U/L (ref 15–41)
Alkaline Phosphatase: 152 U/L — ABNORMAL HIGH (ref 38–126)
BUN: 80 mg/dL — ABNORMAL HIGH (ref 6–20)
CHLORIDE: 96 mmol/L — AB (ref 101–111)
CO2: 20 mmol/L — AB (ref 22–32)
Calcium: 7.7 mg/dL — ABNORMAL LOW (ref 8.9–10.3)
Creatinine, Ser: 12.63 mg/dL — ABNORMAL HIGH (ref 0.61–1.24)
GFR calc non Af Amer: 4 mL/min — ABNORMAL LOW (ref >60)
GFR, EST AFRICAN AMERICAN: 4 mL/min — AB (ref >60)
GLUCOSE: 159 mg/dL — AB (ref 65–99)
POTASSIUM: 3.4 mmol/L — AB (ref 3.5–5.1)
SODIUM: 137 mmol/L (ref 135–145)
Total Bilirubin: 0.9 mg/dL (ref 0.3–1.2)
Total Protein: 7.3 g/dL (ref 6.5–8.1)

## 2015-08-16 LAB — GLUCOSE, CAPILLARY
GLUCOSE-CAPILLARY: 176 mg/dL — AB (ref 65–99)
GLUCOSE-CAPILLARY: 137 mg/dL — AB (ref 65–99)
GLUCOSE-CAPILLARY: 159 mg/dL — AB (ref 65–99)
Glucose-Capillary: 172 mg/dL — ABNORMAL HIGH (ref 65–99)

## 2015-08-16 MED ORDER — POTASSIUM CHLORIDE CRYS ER 20 MEQ PO TBCR
20.0000 meq | EXTENDED_RELEASE_TABLET | Freq: Once | ORAL | Status: AC
  Administered 2015-08-16: 20 meq via ORAL
  Filled 2015-08-16: qty 1

## 2015-08-16 MED ORDER — POTASSIUM CHLORIDE CRYS ER 20 MEQ PO TBCR
40.0000 meq | EXTENDED_RELEASE_TABLET | Freq: Once | ORAL | Status: AC
  Administered 2015-08-16: 40 meq via ORAL
  Filled 2015-08-16: qty 2

## 2015-08-16 NOTE — Progress Notes (Signed)
Assessment:  1. Presumably ARF in face of DKA with ATN though he has microhematuria of ? significance. (Scr 1.15 in 8/16). Comps and serologies Neg. UOP improved with 1.9 L out yest. Cr stable (12.63), BUN 80 2. DKA 3. Hypokalemia - K 3.4 4. HTN 5. Hypernatremia, resolved 6. Thrombocytopenia, improving  Plan: 1. Will follow Scr and decide on further HD. UO appears to be improving and I expect renal fx to recover. Will decide on further HD prn. Last HD 2/10 2. Continue Lasix 160 PO TID 3. Replete K 4. Daily labs   Subjective: Interval History: Feels well, wondering if he will need HD again  Objective: Vital signs in last 24 hours: Temp:  [97.5 F (36.4 C)-98.8 F (37.1 C)] 97.6 F (36.4 C) (02/14 0820) Pulse Rate:  [88-93] 93 (02/14 0820) Resp:  [16-18] 17 (02/14 0820) BP: (141-157)/(66-78) 152/78 mmHg (02/14 0820) SpO2:  [96 %-98 %] 97 % (02/14 0820) Weight:  [269 lb 9.6 oz (122.29 kg)] 269 lb 9.6 oz (122.29 kg) (02/14 0355) Weight change: 0 lb (0 kg)  Intake/Output from previous day: 02/13 0701 - 02/14 0700 In: 600 [P.O.:600] Out: 1900 [Urine:1900] Intake/Output this shift:     Gen: sitting in bed, NAD Head: NCAT Resp: clear to auscultation bilaterally Cardio: regular rate and rhythm, S1, S2 normal, no murmur, click, rub or gallop GI: protuberant w/o masses Extremities: extremities normal, atraumatic, no cyanosis or edema  Lab Results: No results for input(s): WBC, HGB, HCT, PLT in the last 72 hours. BMET:   Recent Labs  08/15/15 0501 08/16/15 0655  NA 137 137  K 3.4* 3.4*  CL 96* 96*  CO2 21* 20*  GLUCOSE 232* 159*  BUN 82* 80*  CREATININE 12.75* 12.63*  CALCIUM 7.4* 7.7*   No results for input(s): PTH in the last 72 hours. Iron Studies: No results for input(s): IRON, TIBC, TRANSFERRIN, FERRITIN in the last 72 hours. Studies/Results: No results found.  Scheduled: . amLODipine  5 mg Oral Daily  . antiseptic oral rinse  7 mL Mouth Rinse q12n4p   . chlorhexidine  15 mL Mouth Rinse BID  . furosemide  160 mg Oral TID  . insulin aspart  0-15 Units Subcutaneous TID WC  . insulin aspart  0-5 Units Subcutaneous QHS  . insulin glargine  16 Units Subcutaneous Daily  . insulin starter kit- pen needles  1 kit Other Once  . living well with diabetes book   Does not apply Once  . pantoprazole  40 mg Oral BID     LOS: 11 days    Virginia Crews, MD, MPH PGY-2,  [facility] 08/16/2015 8:55 AM   Renal Attending: He is nonoliguric with no evidence of significant volume overload.  Good UOP.  Will hold furosemide and follow. Jodean Valade C

## 2015-08-16 NOTE — Progress Notes (Signed)
RT placed patient on CPAP HS on AUTO. Patient tolerating well. Patient in no distress. RT will continue to monitor as needed.

## 2015-08-16 NOTE — Progress Notes (Signed)
[facility] PROGRESS NOTE  Micheal Gomez ZOX:[id number] DOB: [birth year] DOA: 08/05/2015 PCP: No primary care provider on file.  Brief narrative 60 year old male with history of hypertension, diabetes (recently diagnosed but not treated), recently diagnosed OSA and started on CPAP was found on the floor by his unsafe for altered mental status. He was brought to the ED where he was hypotensive with encephalopathy, had severe metabolic acidosis with positive beta hydroxybutyric acid . CBG was >600 blood work showed severe and gap acidosis with blood glucose of 1500. He also had acute kidney injury with BUN of 103 and creatinine of 6.48. Lactic acid was elevated to 3.93.  UA Showed hematuria, glycosuria with many bacteria. Also had rhabdomyolysis. Patient admitted to ICU and started on glucose stabilizer. Nephrology was consulted and patient started on temporary hemodialysis. DKA and metabolic acidosis resolved and patient transfer to stepdown unit.    Assessment/Plan: DKA in the setting of uncontrolled diabetes mellitus A1c of 15.8. Required insulin drip on admission. Now started on insulin. Continue diabetic education.  Patient reported when he followed at [facility] clinic that he was borderline diabetic.  FSG improved after Lantus dose increased to 16 units daily. Will increase further to 18 units daily. Continue sliding scale coverage. Given prescription to the fianc for glucometer, lancets,  insulin syringe for her to fill them up at the [place].  prescribe insulin upon discharge (depending upon how much he would need)  Acute kidney injury/ATN requiring temporary hemodialysis.  Presumed acute kidney injury in the setting of DKA. Compliments and serologies negative.  -Last dialysis on 2/10.  -On Lasix drip with good urine output. (2 L past 24 hours). Still has positive balance of almost 9 liter since admission. -Switched to oral Lasix.  Appreciate renal follow-up. Hopefully function will recover  without further dialysis. Monitor strict I/O.  Rhabdomyolysis Possibly in the setting ATN and severe dehydration. CK improving.   Essential hypertension Oral Lasix. Added amlodipine.  Acute metabolic encephalopathy In the setting of severe DKA and? Uremia. Now resolved.  Transaminitis ? Associated with rhabdomyolysis. Acute hepatitis panel negative. Liver ultrasound shows fatty changes only. LFTs improving.  thrombocytopenia No clear etiology? Associated with rhabdo. Avoid heparin products.  Macrocytic anemia Low iron saturation noted.   Diet: Renal/diabetic  Code Status: Full code  Family Communication: Fianc at bedside  Disposition Plan: Home once renal function improved and no further dialysis needs.. Possibly later this week.  Consultants: Renal PC CM   Procedures: HD catheter placement Abdominal ultrasound   Antibiotics:  none  HPI/Subjective: Seen and examined. Reports feeling well. No overnight issues.    Objective: Filed Vitals:   08/16/15 0440 08/16/15 0820  BP: 157/73 152/78  Pulse: 92 93  Temp: 98.5 F (36.9 C) 97.6 F (36.4 C)  Resp: 18 17    Intake/Output Summary (Last 24 hours) at 08/16/15 1157 Last data filed at 08/16/15 1008  Gross per 24 hour  Intake    360 ml  Output   1760 ml  Net  -1400 ml   Filed Weights   08/14/15 0401 08/15/15 0500 08/16/15 0355  Weight: 122.29 kg (269 lb 9.6 oz) 122.29 kg (269 lb 9.6 oz) 122.29 kg (269 lb 9.6 oz)    Exam:   General: not in distress  HEENT:  moist mucosa, right IJ  Chest: Clear bilaterally  CVS: Normal S1 and S2, no murmurs rub or gallop  GI: Soft, nondistended, nontender, bowel sounds present  Musculoskeletal: Warm, trace pitting edema bilaterally  Data Reviewed: Basic Metabolic Panel:  Recent Labs Lab 08/10/15 0435 08/10/15 0803  08/12/15 0500 08/13/15 0503 08/14/15 0520 08/15/15 0501 08/16/15 0655  NA 141 138  < > 140 139 140 137 137  K 3.9 4.6  < > 4.1  3.4* 3.5 3.4* 3.4*  CL 103 102  < > 101 99* 98* 96* 96*  CO2 25 19*  < > _0 21* 20*  GLUCOSE 149* 196*  < > 220* 157* 195* 232* 159*  BUN 75* 80*  < > 109* 65* 77* 82* 80*  CREATININE 9.88* 10.34*  < > 14.01* 10.56* 12.08* 12.75* 12.63*  CALCIUM 6.8* 6.7*  < > 7.0* 7.3* 7.4* 7.4* 7.7*  MG  --  2.1  --   --   --   --   --   --   PHOS 3.6  --   --  7.4*  --  9.6* 9.8*  --   < > = values in this interval not displayed. Liver Function Tests:  Recent Labs Lab 08/11/15 0940  08/13/15 0503 08/14/15 0519 08/14/15 0520 08/15/15 0501 08/16/15 0655  AST 113*  --  63* 44*  --   --  23  ALT 166*  --  123* 94*  --   --  61  ALKPHOS 128*  --  148* 143*  --   --  152*  BILITOT 1.1  --  0.5 0.6  --   --  0.9  PROT 6.0*  --  6.0* 6.2*  --   --  7.3  ALBUMIN 2.5*  < > 2.6* 2.6* 2.6* 2.6* 2.7*  < > = values in this interval not displayed. No results for input(s): LIPASE, AMYLASE in the last 168 hours. No results for input(s): AMMONIA in the last 168 hours. CBC:  Recent Labs Lab 08/09/15 1446 08/11/15 0940 08/13/15 0503  WBC 6.0 6.0 5.9  NEUTROABS 4.4  --   --   HGB 10.0* 9.8* 9.8*  HCT 30.6* 30.4* 29.4*  MCV 79.3 80.0 80.1  PLT 42* 78* 133*   Cardiac Enzymes:  Recent Labs Lab 08/10/15 0803 08/11/15 0940 08/12/15 0500 08/14/15 0519  CKTOTAL 5703* 3084* 1781* 485*   BNP (last 3 results) No results for input(s): BNP in the last 8760 hours.  ProBNP (last 3 results) No results for input(s): PROBNP in the last 8760 hours.  CBG:  Recent Labs Lab 08/15/15 0811 08/15/15 1138 08/15/15 1635 08/15/15 2330 08/16/15 0818  GLUCAP 182* 162* 239* 141* 176*    No results found for this or any previous visit (from the past 240 hour(s)).   Studies: No results found.  Scheduled Meds: . amLODipine  5 mg Oral Daily  . antiseptic oral rinse  7 mL Mouth Rinse q12n4p  . chlorhexidine  15 mL Mouth Rinse BID  . furosemide  160 mg Oral TID  . insulin aspart  0-15 Units  Subcutaneous TID WC  . insulin aspart  0-5 Units Subcutaneous QHS  . insulin glargine  16 Units Subcutaneous Daily  . insulin starter kit- pen needles  1 kit Other Once  . living well with diabetes book   Does not apply Once  . pantoprazole  40 mg Oral BID  . potassium chloride  40 mEq Oral Once   Continuous Infusions: . sodium chloride 10 mL/hr at 08/10/15 0854      Time spent: 25 minutes    ,   [facility] Pager:[phone]. If 7PM-7AM, please contact night-coverage at [email or web address],  password [facility] 08/16/2015, 11:57 AM  LOS: 11 days

## 2015-08-17 LAB — CBC
HEMATOCRIT: 32.3 % — AB (ref 39.0–52.0)
HEMOGLOBIN: 10.8 g/dL — AB (ref 13.0–17.0)
MCH: 26 pg (ref 26.0–34.0)
MCHC: 33.4 g/dL (ref 30.0–36.0)
MCV: 77.8 fL — AB (ref 78.0–100.0)
Platelets: 318 10*3/uL (ref 150–400)
RBC: 4.15 MIL/uL — AB (ref 4.22–5.81)
RDW: 13.9 % (ref 11.5–15.5)
WBC: 9.1 10*3/uL (ref 4.0–10.5)

## 2015-08-17 LAB — RENAL FUNCTION PANEL
ANION GAP: 18 — AB (ref 5–15)
Albumin: 3.1 g/dL — ABNORMAL LOW (ref 3.5–5.0)
BUN: 80 mg/dL — ABNORMAL HIGH (ref 6–20)
CALCIUM: 7.8 mg/dL — AB (ref 8.9–10.3)
CO2: 20 mmol/L — AB (ref 22–32)
Chloride: 96 mmol/L — ABNORMAL LOW (ref 101–111)
Creatinine, Ser: 12.09 mg/dL — ABNORMAL HIGH (ref 0.61–1.24)
GFR calc non Af Amer: 4 mL/min — ABNORMAL LOW (ref >60)
GFR, EST AFRICAN AMERICAN: 5 mL/min — AB (ref >60)
GLUCOSE: 192 mg/dL — AB (ref 65–99)
PHOSPHORUS: 9.8 mg/dL — AB (ref 2.5–4.6)
POTASSIUM: 4.1 mmol/L (ref 3.5–5.1)
Sodium: 134 mmol/L — ABNORMAL LOW (ref 135–145)

## 2015-08-17 LAB — GLUCOSE, CAPILLARY
GLUCOSE-CAPILLARY: 149 mg/dL — AB (ref 65–99)
GLUCOSE-CAPILLARY: 191 mg/dL — AB (ref 65–99)
Glucose-Capillary: 250 mg/dL — ABNORMAL HIGH (ref 65–99)
Glucose-Capillary: 146 mg/dL — ABNORMAL HIGH (ref 65–99)

## 2015-08-17 MED ORDER — SODIUM CHLORIDE 0.9 % IV SOLN: 5.0000 mg | Freq: Once | INTRAVENOUS | Status: DC

## 2015-08-17 MED ORDER — ALTEPLASE 2 MG IJ SOLR
2.0000 mg | Freq: Once | INTRAMUSCULAR | Status: DC
  Filled 2015-08-17: qty 2

## 2015-08-17 MED ORDER — ALTEPLASE 2 MG IJ SOLR
2.0000 mg | Freq: Once | INTRAMUSCULAR | Status: AC
  Administered 2015-08-17: 2 mg
  Filled 2015-08-17: qty 2

## 2015-08-17 NOTE — Progress Notes (Signed)
Occupational Therapy Treatment Patient Details Name: Micheal Gomez MRN: [id number] DOB: 09-May-1956 Today's Date: 08/17/2015    History of present illness 60 y/o male with "pre-diabetes", HTN, OSA admitted on 2/4 with acute encephalopathy and DKA with AKI and severe hypernatremia   OT comments  Patient progressing nicely towards OT goals. Upgraded some OT goals, to reflect no need for RW for functional ambulation/mobility at this time.   Follow Up Recommendations  No OT follow up;Supervision/Assistance - 24 hour (by the time he d/c home he may be ok with intermittent supervision)    Equipment Recommendations  3 in 1 bedside comode    Recommendations for Other Services  None at this time   Precautions / Restrictions Precautions Precautions: Fall Restrictions Weight Bearing Restrictions: No    Mobility Bed Mobility Overal bed mobility: Modified Independent Bed Mobility: Supine to Sit;Sit to Supine  Transfers Overall transfer level: Modified independent Equipment used: None Transfers: Sit to/from Stand General transfer comment: No assist, min sway, no device needed    Balance Overall balance assessment: Needs assistance Sitting-balance support: No upper extremity supported;Feet supported Sitting balance-Leahy Scale: Good     Standing balance support: No upper extremity supported;During functional activity Standing balance-Leahy Scale: Good   ADL Overall ADL's : Needs assistance/impaired Eating/Feeding: Set up;Sitting   Grooming: Supervision/safety;Standing Grooming Details (indicate cue type and reason): at sink Upper Body Bathing: Supervision/ safety;Standing Upper Body Bathing Details (indicate cue type and reason): at sink Lower Body Bathing: Min guard;Supervison/ safety   Upper Body Dressing : Supervision/safety;Standing Upper Body Dressing Details (indicate cue type and reason): at sink Lower Body Dressing: Supervision/safety;Sit to/from stand   Toilet  Transfer: Supervision/safety;BSC;Ambulation   Toileting- Water quality scientist and Hygiene: Supervision/safety;Sit to/from Nurse, children's Details (indicate cue type and reason): Did not occur, however educated pt on how he can use 3-n-1 in his tub/shower unit at home. Would like to practice more simulated tub/shower transfer with patient next session.    General ADL Comments: Pt states he feels like he's doing better. Pt did not use AD for functional ambulation around room. Pt stood at sink for ADL. Pt states he will be here about another week for further workup on his kidneys. As stated above, would like to pracitice simulated tub/shower transfer.                 Cognition   Behavior During Therapy: WFL for tasks assessed/performed Overall Cognitive Status: Within Functional Limits for tasks assessed                Pertinent Vitals/ Pain       Pain Assessment: No/denies pain         Frequency Min 2X/week     Progress Toward Goals  OT Goals(current goals can now be found in the care plan section)  Progress towards OT goals: Progressing toward goals  Acute Rehab OT Goals Patient Stated Goal: Go home OT Goal Formulation: With patient Time For Goal Achievement: 08/25/15 Potential to Achieve Goals: Good  Plan Discharge plan remains appropriate       End of Session   Activity Tolerance Patient tolerated treatment well   Patient Left in bed;with call bell/phone within reach;with bed alarm set     Time: 1135-1148 OT Time Calculation (min): 13 min  Charges: OT General Charges $OT Visit: 1 Procedure OT Treatments $Self Care/Home Management : 8-22 mins  Chrys Racer , MS, OTR/L, CLT Pager: [phone]  08/17/2015, 1:54 PM

## 2015-08-17 NOTE — Progress Notes (Signed)
Assessment:  1. Presumably ARF in face of DKA with ATN though he has microhematuria of ? significance. (Scr 1.15 in 8/16). Comps and serologies Neg. UOP improved with 1.4 L out yest. Cr stable (12.63), BUN 80 2. DKA 3. Hypokalemia - K 3.4 > pending this AM 4. HTN 5. Hypernatremia, resolved 6. Thrombocytopenia, improving  Plan: 1. Will follow Scr and decide on further HD. UO appears to be improving and I expect renal fx to recover. Will decide on further HD prn. Last HD 2/10 2.  Lasix held 3. Daily labs  4. May need tpa for clotted HD access  Subjective: Interval History: Feels well, urinating well, wondering about seeing [facility] as outpatient  Objective: Vital signs in last 24 hours: Temp:  [98.3 F (36.8 C)-98.7 F (37.1 C)] 98.5 F (36.9 C) (02/15 0429) Pulse Rate:  [92-107] 92 (02/15 0429) Resp:  [18-20] 20 (02/15 0429) BP: (133-155)/(57-81) 133/81 mmHg (02/15 0429) SpO2:  [98 %-99 %] 99 % (02/15 0429) Weight:  [269 lb 10 oz (122.3 kg)] 269 lb 10 oz (122.3 kg) (02/14 2113) Weight change: 0.4 oz (0.01 kg)  Intake/Output from previous day: 02/14 0701 - 02/15 0700 In: 840 [P.O.:840] Out: 1410 [Urine:1410] Intake/Output this shift:     Gen: sitting in chair, NAD Head: NCAT Resp: clear to auscultation bilaterally Cardio: regular rate and rhythm, S1, S2 normal, no murmur, click, rub or gallop GI: protuberant w/o masses Extremities: extremities normal, atraumatic, no cyanosis or edema  Lab Results: No results for input(s): WBC, HGB, HCT, PLT in the last 72 hours. BMET:   Recent Labs  08/15/15 0501 08/16/15 0655  NA 137 137  K 3.4* 3.4*  CL 96* 96*  CO2 21* 20*  GLUCOSE 232* 159*  BUN 82* 80*  CREATININE 12.75* 12.63*  CALCIUM 7.4* 7.7*   No results for input(s): PTH in the last 72 hours. Iron Studies: No results for input(s): IRON, TIBC, TRANSFERRIN, FERRITIN in the last 72 hours. Studies/Results: No results found.  Scheduled: . amLODipine   5 mg Oral Daily  . antiseptic oral rinse  7 mL Mouth Rinse q12n4p  . chlorhexidine  15 mL Mouth Rinse BID  . insulin aspart  0-15 Units Subcutaneous TID WC  . insulin aspart  0-5 Units Subcutaneous QHS  . insulin glargine  16 Units Subcutaneous Daily  . insulin starter kit- pen needles  1 kit Other Once  . living well with diabetes book   Does not apply Once  . pantoprazole  40 mg Oral BID     LOS: 12 days    Virginia Crews, MD, MPH PGY-2,  [facility] 08/17/2015 8:45 AM    Renal Attending: Agree with note articulated above. Will follow closely as recovery is anticipated.  Will hold on HD. Lasundra Hascall C

## 2015-08-17 NOTE — Progress Notes (Signed)
Pt stated that he does not need any help setting up his CPAP. Humidity provided in the reservoir of the CPAP. Told pt if need any help setting it up let me know. Pt is stable at this time.

## 2015-08-17 NOTE — Progress Notes (Signed)
Physical Therapy Treatment Patient Details Name: Micheal Gomez MRN: [id number] DOB: 1956/06/13 Today's Date: 08/17/2015    History of Present Illness 60 y/o male with "pre-diabetes", HTN, OSA admitted on 2/4 with acute encephalopathy and DKA with AKI and severe hypernatremia    PT Comments    Continues to make solid progress towards functional goals. Better endurance and tolerance to higher level gait activities but still with some balance deficits. Patient will continue to benefit from skilled physical therapy services to further improve independence with functional mobility.   Follow Up Recommendations  Supervision for mobility/OOB;Home health PT     Equipment Recommendations  Rolling walker with 5" wheels    Recommendations for Other Services       Precautions / Restrictions Precautions Precautions: Fall    Mobility  Bed Mobility               General bed mobility comments: in recliner  Transfers Overall transfer level: Modified independent               General transfer comment: No assist, min sway, no device needed  Ambulation/Gait Ambulation/Gait assistance: Supervision Ambulation Distance (Feet): 280 Feet Assistive device: None Gait Pattern/deviations: Step-through pattern;Decreased stride length;Wide base of support Gait velocity: decreased Gait velocity interpretation: Below normal speed for age/gender General Gait Details: Performed higher level dynamic gait tasks. Improving but with some deficits still notable during head turns, backwards stepping, and lack of significant change with variable speeds. No physical assist required to correct balance during bout.   Stairs Stairs: Yes Stairs assistance: Supervision Stair Management: No rails;One rail Right;Step to pattern;Alternating pattern;Forwards Number of Stairs: 12 (+6) General stair comments: Practiced with single rail, alternating step pattern. LLE weakness noted with ascent.  Cued for  sequencing. practiced without rail, less stable, step-to pattern indicated for safety.  Wheelchair Mobility    Modified Rankin (Stroke Patients Only)       Balance                                    Cognition Arousal/Alertness: Awake/alert Behavior During Therapy: WFL for tasks assessed/performed Overall Cognitive Status: Within Functional Limits for tasks assessed                      Exercises      General Comments General comments (skin integrity, edema, etc.): States he has been ambulating with staff frequently      Pertinent Vitals/Pain Pain Assessment: No/denies pain    Home Living                      Prior Function            PT Goals (current goals can now be found in the care plan section) Acute Rehab PT Goals Patient Stated Goal: Go home PT Goal Formulation: With patient Time For Goal Achievement: 08/24/15 Potential to Achieve Goals: Good Progress towards PT goals: Progressing toward goals    Frequency  Min 3X/week    PT Plan Current plan remains appropriate    Co-evaluation             End of Session Equipment Utilized During Treatment: Gait belt Activity Tolerance: Patient tolerated treatment well Patient left: with call bell/phone within reach;in chair;with chair alarm set     Time: 0910-0929 PT Time Calculation (min) (ACUTE ONLY): 19 min  Charges:  $Gait Training: 8-22  mins                    G Codes:      Ellouise Newer September 14, 2015, 10:49 AM Elayne Snare, [place]

## 2015-08-17 NOTE — Progress Notes (Signed)
[facility] PROGRESS NOTE  Micheal Gomez TSV:[id number] DOB: 1955/11/20 DOA: 08/05/2015 PCP: No primary care provider on file.  Brief narrative 60 year old male with history of hypertension, diabetes (recently diagnosed but not treated), recently diagnosed OSA and started on CPAP was found on the floor by his unsafe for altered mental status. He was brought to the ED where he was hypotensive with encephalopathy, had severe metabolic acidosis with positive beta hydroxybutyric acid . CBG was >600 blood work showed severe and gap acidosis with blood glucose of 1500. He also had acute kidney injury with BUN of 103 and creatinine of 6.48. Lactic acid was elevated to 3.93.  UA Showed hematuria, glycosuria with many bacteria. Also had rhabdomyolysis. Patient admitted to ICU and started on glucose stabilizer. Nephrology was consulted and patient started on temporary hemodialysis. DKA and metabolic acidosis resolved and patient transfer to stepdown unit.    Assessment/Plan: DKA in the setting of uncontrolled diabetes mellitus A1c of 15.8. Required insulin drip on admission. Now started on insulin. Continue diabetic education.   - Continue current insulin regimen with sliding scale insulin, night coverage and Lantus  Acute kidney injury/ATN requiring temporary hemodialysis.  Presumed acute kidney injury in the setting of DKA. Compliments and serologies negative.  -Last dialysis on 2/10.  -Nephrology managing  Rhabdomyolysis Possibly in the setting ATN and severe dehydration. CK improving.  Essential hypertension Currently on amlodipine.  Acute metabolic encephalopathy In the setting of severe DKA and? Uremia.  Now resolved.  Transaminitis LFT's within normal limits on last check  thrombocytopenia No clear etiology? Associated with rhabdo. Avoid heparin products.  Macrocytic anemia Low iron saturation noted.  Diet: Renal/diabetic  Code Status: Full code  Family Communication:  d/c patient directly Disposition Plan: Home once renal function improved and no further dialysis needs  Consultants: Renal PC CM   Procedures: HD catheter placement Abdominal ultrasound   Antibiotics:  none  HPI/Subjective: Pt has no new complaints. No acute issues reported overnight.    Objective: Filed Vitals:   08/17/15 0429 08/17/15 0900  BP: 133/81 145/65  Pulse: 92 102  Temp: 98.5 F (36.9 C) 97.4 F (36.3 C)  Resp: 20 18    Intake/Output Summary (Last 24 hours) at 08/17/15 1301 Last data filed at 08/17/15 1218  Gross per 24 hour  Intake    600 ml  Output    950 ml  Net   -350 ml   Filed Weights   08/15/15 0500 08/16/15 0355 08/16/15 2113  Weight: 122.29 kg (269 lb 9.6 oz) 122.29 kg (269 lb 9.6 oz) 122.3 kg (269 lb 10 oz)    Exam:   General: alert and awake  HEENT:  moist mucosa, right IJ  Chest: Clear bilaterally, no wheezes, no increased wob  CVS: Normal S1 and S2, no murmurs rub or gallop  GI: Soft, nondistended, nontender, bowel sounds present  Musculoskeletal: Warm, trace pitting edema bilaterally     Data Reviewed: Basic Metabolic Panel:  Recent Labs Lab 08/12/15 0500 08/13/15 0503 08/14/15 0520 08/15/15 0501 08/16/15 0655 08/17/15 1050  NA 140 139 140 137 137 134*  K 4.1 3.4* 3.5 3.4* 3.4* 4.1  CL 101 99* 98* 96* 96* 96*  CO2 '22 24 23 '$ 21* 20* 20*  GLUCOSE 220* 157* 195* 232* 159* 192*  BUN 109* 65* 77* 82* 80* 80*  CREATININE 14.01* 10.56* 12.08* 12.75* 12.63* 12.09*  CALCIUM 7.0* 7.3* 7.4* 7.4* 7.7* 7.8*  PHOS 7.4*  --  9.6* 9.8*  --  9.8*  Liver Function Tests:  Recent Labs Lab 08/11/15 0940  08/13/15 0503 08/14/15 0519 08/14/15 0520 08/15/15 0501 08/16/15 0655 08/17/15 1050  AST 113*  --  63* 44*  --   --  23  --   ALT 166*  --  123* 94*  --   --  61  --   ALKPHOS 128*  --  148* 143*  --   --  152*  --   BILITOT 1.1  --  0.5 0.6  --   --  0.9  --   PROT 6.0*  --  6.0* 6.2*  --   --  7.3  --   ALBUMIN  2.5*  < > 2.6* 2.6* 2.6* 2.6* 2.7* 3.1*  < > = values in this interval not displayed. No results for input(s): LIPASE, AMYLASE in the last 168 hours. No results for input(s): AMMONIA in the last 168 hours. CBC:  Recent Labs Lab 08/11/15 0940 08/13/15 0503 08/17/15 1050  WBC 6.0 5.9 9.1  HGB 9.8* 9.8* 10.8*  HCT 30.4* 29.4* 32.3*  MCV 80.0 80.1 77.8*  PLT 78* 133* 318   Cardiac Enzymes:  Recent Labs Lab 08/11/15 0940 08/12/15 0500 08/14/15 0519  CKTOTAL 3084* 1781* 485*   BNP (last 3 results) No results for input(s): BNP in the last 8760 hours.  ProBNP (last 3 results) No results for input(s): PROBNP in the last 8760 hours.  CBG:  Recent Labs Lab 08/16/15 1203 08/16/15 1700 08/16/15 2112 08/17/15 0757 08/17/15 1145  GLUCAP 137* 159* 172* 250* 149*    No results found for this or any previous visit (from the past 240 hour(s)).   Studies: No results found.  Scheduled Meds: . alteplase  2 mg Intracatheter Once  . amLODipine  5 mg Oral Daily  . antiseptic oral rinse  7 mL Mouth Rinse q12n4p  . chlorhexidine  15 mL Mouth Rinse BID  . insulin aspart  0-15 Units Subcutaneous TID WC  . insulin aspart  0-5 Units Subcutaneous QHS  . insulin glargine  16 Units Subcutaneous Daily  . insulin starter kit- pen needles  1 kit Other Once  . living well with diabetes book   Does not apply Once  . pantoprazole  40 mg Oral BID   Continuous Infusions: . sodium chloride 10 mL/hr at 08/10/15 0854      Time spent: 30 minutes    Micheal Gomez, [facility]  [facility] Pager:[phone] If 7PM-7AM, please contact night-coverage at [email or web address], password [facility] 08/17/2015, 1:01 PM  LOS: 12 days

## 2015-08-18 LAB — RENAL FUNCTION PANEL
Albumin: 2.7 g/dL — ABNORMAL LOW (ref 3.5–5.0)
Anion gap: 14 (ref 5–15)
BUN: 80 mg/dL — AB (ref 6–20)
CALCIUM: 7.2 mg/dL — AB (ref 8.9–10.3)
CO2: 20 mmol/L — AB (ref 22–32)
CREATININE: 10.94 mg/dL — AB (ref 0.61–1.24)
Chloride: 98 mmol/L — ABNORMAL LOW (ref 101–111)
GFR calc Af Amer: 5 mL/min — ABNORMAL LOW (ref >60)
GFR calc non Af Amer: 4 mL/min — ABNORMAL LOW (ref >60)
GLUCOSE: 148 mg/dL — AB (ref 65–99)
Phosphorus: 10 mg/dL — ABNORMAL HIGH (ref 2.5–4.6)
Potassium: 3.5 mmol/L (ref 3.5–5.1)
SODIUM: 132 mmol/L — AB (ref 135–145)

## 2015-08-18 LAB — GLUCOSE, CAPILLARY
GLUCOSE-CAPILLARY: 147 mg/dL — AB (ref 65–99)
Glucose-Capillary: 150 mg/dL — ABNORMAL HIGH (ref 65–99)
Glucose-Capillary: 118 mg/dL — ABNORMAL HIGH (ref 65–99)
Glucose-Capillary: 164 mg/dL — ABNORMAL HIGH (ref 65–99)

## 2015-08-18 NOTE — Progress Notes (Signed)
Patient stated he is able to apply the CPAP on his own.  RT rinsed out water chamber per patient's request and filled with sterile water.  RT will continue to monitor.

## 2015-08-18 NOTE — Progress Notes (Signed)
Assessment:  1. Presumably ARF in face of DKA with ATN though he has microhematuria of ? significance. (Scr 1.15 in 8/16). Comps and serologies Neg. Recovery Phase 2. DKA, resolved 3. Hypokalemia - resolved 4. HTN 5. Hypernatremia, resolved 6. Thrombocytopenia, resolved  Plan: 1 Recovery in place. HD catheter left for blood draws/access 2 Recheck urine analysis for microscopic hematuria  Subjective: Interval History: Good UOP  Objective: Vital signs in last 24 hours: Temp:  [97.9 F (36.6 C)-98 F (36.7 C)] 98 F (36.7 C) (02/16 0455) Pulse Rate:  [84-98] 98 (02/16 0455) Resp:  [18] 18 (02/16 0455) BP: (134-138)/(69-85) 134/85 mmHg (02/16 0455) SpO2:  [98 %-99 %] 98 % (02/16 0455) Weight change:   Intake/Output from previous day: 02/15 0701 - 02/16 0700 In: 740 [P.O.:740] Out: 950 [Urine:950] Intake/Output this shift:    General appearance: alert and cooperative Resp: clear to auscultation bilaterally Chest wall: no tenderness Cardio: regular rate and rhythm, S1, S2 normal, no murmur, click, rub or gallop Extremities: edema tr  Lab Results:  Recent Labs  08/17/15 1050  WBC 9.1  HGB 10.8*  HCT 32.3*  PLT 318   BMET:  Recent Labs  08/17/15 1050 08/18/15 0546  NA 134* 132*  K 4.1 3.5  CL 96* 98*  CO2 20* 20*  GLUCOSE 192* 148*  BUN 80* 80*  CREATININE 12.09* 10.94*  CALCIUM 7.8* 7.2*   No results for input(s): PTH in the last 72 hours. Iron Studies: No results for input(s): IRON, TIBC, TRANSFERRIN, FERRITIN in the last 72 hours. Studies/Results: No results found.  Scheduled: . alteplase  2 mg Intracatheter Once  . alteplase  2 mg Intracatheter Once  . amLODipine  5 mg Oral Daily  . antiseptic oral rinse  7 mL Mouth Rinse q12n4p  . chlorhexidine  15 mL Mouth Rinse BID  . insulin aspart  0-15 Units Subcutaneous TID WC  . insulin aspart  0-5 Units Subcutaneous QHS  . insulin glargine  16 Units Subcutaneous Daily  . insulin starter kit- pen  needles  1 kit Other Once  . living well with diabetes book   Does not apply Once  . pantoprazole  40 mg Oral BID     LOS: 13 days   Jia Mohamed C 08/18/2015,10:03 AM

## 2015-08-18 NOTE — Progress Notes (Signed)
[facility] PROGRESS NOTE  Zaydn Gutridge [id number]:[id number] DOB: [birth year] DOA: 08/05/2015 PCP: No primary care provider on file.  Brief narrative 60 year old male with history of hypertension, diabetes (recently diagnosed but not treated), recently diagnosed OSA and started on CPAP was found on the floor by his unsafe for altered mental status. He was brought to the ED where he was hypotensive with encephalopathy, had severe metabolic acidosis with positive beta hydroxybutyric acid . CBG was >600 blood work showed severe and gap acidosis with blood glucose of 1500. He also had acute kidney injury with BUN of 103 and creatinine of 6.48. Lactic acid was elevated to 3.93.  UA Showed hematuria, glycosuria with many bacteria. Also had rhabdomyolysis. Patient admitted to ICU and started on glucose stabilizer. Nephrology was consulted and patient started on temporary hemodialysis. DKA and metabolic acidosis resolved and patient transfer to stepdown unit.    Assessment/Plan: DKA in the setting of uncontrolled diabetes mellitus A1c of 15.8. Required insulin drip on admission. Now started on insulin. Continue diabetic education.   - Continue current insulin regimen with sliding scale insulin, night coverage and Lantus  Acute kidney injury/ATN requiring temporary hemodialysis.  Presumed acute kidney injury in the setting of DKA. Compliments and serologies negative.  -Last dialysis on 2/10.  -Nephrology managing and reportedly patient is in the recovery phase  Rhabdomyolysis Possibly in the setting ATN and severe dehydration. CK improving.  Essential hypertension Currently on amlodipine.  Acute metabolic encephalopathy In the setting of severe DKA and? Uremia.  Now resolved.  Transaminitis LFT's within normal limits on last check  thrombocytopenia No clear etiology? Associated with rhabdo. Avoid heparin products.  Macrocytic anemia Low iron saturation noted.  Diet:  Renal/diabetic  Code Status: Full code  Family Communication: d/c patient directly Disposition Plan: Home once renal function improved and no further dialysis needs  Consultants: Renal PCCM   Procedures: HD catheter placement Abdominal ultrasound   Antibiotics:  none  HPI/Subjective: Pt states that his vision is different. He has no reported pain    Objective: Filed Vitals:   08/18/15 0455 08/18/15 1000  BP: 134/85 137/58  Pulse: 98 87  Temp: 98 F (36.7 C) 97.4 F (36.3 C)  Resp: 18 18    Intake/Output Summary (Last 24 hours) at 08/18/15 1652 Last data filed at 08/18/15 1300  Gross per 24 hour  Intake    860 ml  Output   1100 ml  Net   -240 ml   Filed Weights   08/15/15 0500 08/16/15 0355 08/16/15 2113  Weight: 122.29 kg (269 lb 9.6 oz) 122.29 kg (269 lb 9.6 oz) 122.3 kg (269 lb 10 oz)    Exam:   General: alert and awake  HEENT:  moist mucosa, right IJ  Chest: Clear bilaterally, no wheezes, no increased wob  CVS: Normal S1 and S2, no murmurs rub or gallop  GI: Soft, nondistended, nontender, bowel sounds present  Musculoskeletal: Warm, trace pitting edema bilaterally   Data Reviewed: Basic Metabolic Panel:  Recent Labs Lab 08/12/15 0500  08/14/15 0520 08/15/15 0501 08/16/15 0655 08/17/15 1050 08/18/15 0546  NA 140  < > 140 137 137 134* 132*  K 4.1  < > 3.5 3.4* 3.4* 4.1 3.5  CL 101  < > 98* 96* 96* 96* 98*  CO2 22  < > 23 21* 20* 20* 20*  GLUCOSE 220*  < > 195* 232* 159* 192* 148*  BUN 109*  < > 77* 82* 80* 80* 80*  CREATININE 14.01*  < >  12.08* 12.75* 12.63* 12.09* 10.94*  CALCIUM 7.0*  < > 7.4* 7.4* 7.7* 7.8* 7.2*  PHOS 7.4*  --  9.6* 9.8*  --  9.8* 10.0*  < > = values in this interval not displayed. Liver Function Tests:  Recent Labs Lab 08/13/15 0503 08/14/15 0519 08/14/15 0520 08/15/15 0501 08/16/15 0655 08/17/15 1050 08/18/15 0546  AST 63* 44*  --   --  23  --   --   ALT 123* 94*  --   --  61  --   --   ALKPHOS  148* 143*  --   --  152*  --   --   BILITOT 0.5 0.6  --   --  0.9  --   --   PROT 6.0* 6.2*  --   --  7.3  --   --   ALBUMIN 2.6* 2.6* 2.6* 2.6* 2.7* 3.1* 2.7*   No results for input(s): LIPASE, AMYLASE in the last 168 hours. No results for input(s): AMMONIA in the last 168 hours. CBC:  Recent Labs Lab 08/13/15 0503 08/17/15 1050  WBC 5.9 9.1  HGB 9.8* 10.8*  HCT 29.4* 32.3*  MCV 80.1 77.8*  PLT 133* 318   Cardiac Enzymes:  Recent Labs Lab 08/12/15 0500 08/14/15 0519  CKTOTAL 1781* 485*   BNP (last 3 results) No results for input(s): BNP in the last 8760 hours.  ProBNP (last 3 results) No results for input(s): PROBNP in the last 8760 hours.  CBG:  Recent Labs Lab 08/17/15 1145 08/17/15 1637 08/17/15 2134 08/18/15 0907 08/18/15 1151  GLUCAP 149* 146* 191* 150* 118*    No results found for this or any previous visit (from the past 240 hour(s)).   Studies: No results found.  Scheduled Meds: . alteplase  2 mg Intracatheter Once  . alteplase  2 mg Intracatheter Once  . amLODipine  5 mg Oral Daily  . antiseptic oral rinse  7 mL Mouth Rinse q12n4p  . chlorhexidine  15 mL Mouth Rinse BID  . insulin aspart  0-15 Units Subcutaneous TID WC  . insulin aspart  0-5 Units Subcutaneous QHS  . insulin glargine  16 Units Subcutaneous Daily  . insulin starter kit- pen needles  1 kit Other Once  . living well with diabetes book   Does not apply Once  . pantoprazole  40 mg Oral BID   Continuous Infusions: . sodium chloride 10 mL/hr at 08/10/15 0854    Time spent: 30 minutes   Demeshia Sherburne, [facility]  [facility] Pager:[phone] If 7PM-7AM, please contact night-coverage at [email or web address], password [facility] 08/18/2015, 4:52 PM  LOS: 13 days

## 2015-08-19 DIAGNOSIS — R740 Nonspecific elevation of levels of transaminase and lactic acid dehydrogenase [LDH]: Secondary | ICD-10-CM

## 2015-08-19 DIAGNOSIS — N170 Acute kidney failure with tubular necrosis: Secondary | ICD-10-CM

## 2015-08-19 DIAGNOSIS — I10 Essential (primary) hypertension: Secondary | ICD-10-CM

## 2015-08-19 LAB — RENAL FUNCTION PANEL
Albumin: 2.8 g/dL — ABNORMAL LOW (ref 3.5–5.0)
Anion gap: 16 — ABNORMAL HIGH (ref 5–15)
BUN: 78 mg/dL — AB (ref 6–20)
CALCIUM: 7.4 mg/dL — AB (ref 8.9–10.3)
CHLORIDE: 96 mmol/L — AB (ref 101–111)
CO2: 21 mmol/L — ABNORMAL LOW (ref 22–32)
CREATININE: 9.4 mg/dL — AB (ref 0.61–1.24)
GFR, EST AFRICAN AMERICAN: 6 mL/min — AB (ref >60)
GFR, EST NON AFRICAN AMERICAN: 5 mL/min — AB (ref >60)
Glucose, Bld: 204 mg/dL — ABNORMAL HIGH (ref 65–99)
Phosphorus: 8.5 mg/dL — ABNORMAL HIGH (ref 2.5–4.6)
Potassium: 3.4 mmol/L — ABNORMAL LOW (ref 3.5–5.1)
SODIUM: 133 mmol/L — AB (ref 135–145)

## 2015-08-19 LAB — GLUCOSE, CAPILLARY
GLUCOSE-CAPILLARY: 206 mg/dL — AB (ref 65–99)
GLUCOSE-CAPILLARY: 78 mg/dL (ref 65–99)
GLUCOSE-CAPILLARY: 136 mg/dL — AB (ref 65–99)
Glucose-Capillary: 157 mg/dL — ABNORMAL HIGH (ref 65–99)

## 2015-08-19 MED ORDER — ALTEPLASE 2 MG IJ SOLR
2.0000 mg | Freq: Once | INTRAMUSCULAR | Status: AC
  Administered 2015-08-19: 2 mg
  Filled 2015-08-19: qty 2

## 2015-08-19 MED ORDER — POTASSIUM CHLORIDE CRYS ER 20 MEQ PO TBCR
40.0000 meq | EXTENDED_RELEASE_TABLET | Freq: Once | ORAL | Status: AC
  Administered 2015-08-19: 40 meq via ORAL
  Filled 2015-08-19: qty 2

## 2015-08-19 NOTE — Progress Notes (Signed)
[facility] PROGRESS NOTE  Micheal Gomez [id number]:[id number] DOB: 1955-12-07 DOA: 08/05/2015 PCP: No primary care provider on file.  Brief narrative 60 year old male with history of hypertension, diabetes (recently diagnosed but not treated), recently diagnosed OSA and started on CPAP was found on the floor by his unsafe for altered mental status. He was brought to the ED where he was hypotensive with encephalopathy, had severe metabolic acidosis with positive beta hydroxybutyric acid . CBG was >600 blood work showed severe and gap acidosis with blood glucose of 1500. He also had acute kidney injury with BUN of 103 and creatinine of 6.48. Lactic acid was elevated to 3.93.  UA Showed hematuria, glycosuria with many bacteria. Also had rhabdomyolysis. Patient admitted to ICU and started on glucose stabilizer. Nephrology was consulted and patient started on temporary hemodialysis. DKA and metabolic acidosis resolved and patient transfer to stepdown unit.    Assessment/Plan: DKA in the setting of uncontrolled diabetes mellitus -A1c of 15.8. Required insulin drip on admission. Continue diabetic education and modified carb diet   -Continue current insulin regimen with sliding scale insulin, night coverage and Lantus  Acute kidney injury/ATN requiring temporary hemodialysis. -Presumed acute kidney injury in the setting of DKA. Compliments and serologies negative.  -Last dialysis on 2/10.  -Nephrology managing and reportedly patient is in the recovery phase -plan is to discharge when Cr less than 7 -HD catheter to be removed today  Rhabdomyolysis -Possibly in the setting of severe dehydration from DKA.  -CK improving; will monitor intermittently -patient advise to maintain adequate hydration .  Essential hypertension -continue amlodipine. -BP is stable  Acute metabolic encephalopathy -In the setting of severe DKA and? Uremia.  -Now resolved. -patient oriented X  4  Transaminitis -LFT's within normal limits on last check -continue intermittent monitoring  -most likely associated with rhabdo  thrombocytopenia -No clear etiology? Associated with rhabdo.  -Avoid heparin products and follow trend.  Macrocytic anemia Low iron saturation noted. -associated with chronic disease and ongoing renal failure -iron therapy and most likely epogen treatment as some point as per renal discretion  Diet: Renal/diabetic  Code Status: Full code  Family Communication: discussed with patient directly; no family at bedside  Disposition Plan: Home once renal function improved and no further dialysis needed  Consultants: Renal PCCM   Procedures: HD catheter placement Abdominal ultrasound   Antibiotics:  none  HPI/Subjective: Pt is afebrile, denies CP, SOB and N/V. Endorses increase in urine output. No overnight issues. CBG's in 130-150's range   Objective: Filed Vitals:   08/19/15 2022 08/19/15 2115  BP:  132/60  Pulse: 90 89  Temp:  97.8 F (36.6 C)  Resp: 18 22    Intake/Output Summary (Last 24 hours) at 08/19/15 2242 Last data filed at 08/19/15 2115  Gross per 24 hour  Intake   1320 ml  Output   2000 ml  Net   -680 ml   Filed Weights   08/16/15 2113 08/18/15 2224 08/19/15 2204  Weight: 122.3 kg (269 lb 10 oz) 115.214 kg (254 lb) 115.667 kg (255 lb)    Exam:   General: alert and awake, no fever, no CP and no SOB.   HEENT:  moist mucosa, right IJ in place (per renal service will be removed today, 2/17)  Chest: Clear bilaterally, no wheezes, no increased wob  CVS: Normal S1 and S2, no murmurs rub or gallop  GI: Soft, nondistended, nontender, bowel sounds present  Musculoskeletal: Warm, trace pitting edema bilaterally, no cyanosis  Data Reviewed: Basic Metabolic Panel:  Recent Labs Lab 08/14/15 0520 08/15/15 0501 08/16/15 0655 08/17/15 1050 08/18/15 0546 08/19/15 0430  NA 140 137 137 134* 132* 133*  K 3.5 3.4*  3.4* 4.1 3.5 3.4*  CL 98* 96* 96* 96* 98* 96*  CO2 23 21* 20* 20* 20* 21*  GLUCOSE 195* 232* 159* 192* 148* 204*  BUN 77* 82* 80* 80* 80* 78*  CREATININE 12.08* 12.75* 12.63* 12.09* 10.94* 9.40*  CALCIUM 7.4* 7.4* 7.7* 7.8* 7.2* 7.4*  PHOS 9.6* 9.8*  --  9.8* 10.0* 8.5*   Liver Function Tests:  Recent Labs Lab 08/13/15 0503 08/14/15 0519  08/15/15 0501 08/16/15 0655 08/17/15 1050 08/18/15 0546 08/19/15 0430  AST 63* 44*  --   --  23  --   --   --   ALT 123* 94*  --   --  61  --   --   --   ALKPHOS 148* 143*  --   --  152*  --   --   --   BILITOT 0.5 0.6  --   --  0.9  --   --   --   PROT 6.0* 6.2*  --   --  7.3  --   --   --   ALBUMIN 2.6* 2.6*  < > 2.6* 2.7* 3.1* 2.7* 2.8*  < > = values in this interval not displayed. No results for input(s): LIPASE, AMYLASE in the last 168 hours. No results for input(s): AMMONIA in the last 168 hours. CBC:  Recent Labs Lab 08/13/15 0503 08/17/15 1050  WBC 5.9 9.1  HGB 9.8* 10.8*  HCT 29.4* 32.3*  MCV 80.1 77.8*  PLT 133* 318   Cardiac Enzymes:  Recent Labs Lab 08/14/15 0519  CKTOTAL 485*   CBG:  Recent Labs Lab 08/18/15 2120 08/19/15 0738 08/19/15 1125 08/19/15 1630 08/19/15 2113  GLUCAP 164* 206* 78 136* 157*    Studies: No results found.  Scheduled Meds: . amLODipine  5 mg Oral Daily  . antiseptic oral rinse  7 mL Mouth Rinse q12n4p  . chlorhexidine  15 mL Mouth Rinse BID  . insulin aspart  0-15 Units Subcutaneous TID WC  . insulin aspart  0-5 Units Subcutaneous QHS  . insulin glargine  16 Units Subcutaneous Daily  . insulin starter kit- pen needles  1 kit Other Once  . living well with diabetes book   Does not apply Once  . pantoprazole  40 mg Oral BID   Continuous Infusions: . sodium chloride 10 mL/hr at 08/10/15 0854    Time spent: 30 minutes   Barton Dubois  [facility] [phone] If 7PM-7AM, please contact night-coverage at [email or web address], password [facility] 08/19/2015, 10:42 PM  LOS:  14 days

## 2015-08-19 NOTE — Progress Notes (Signed)
Physical Therapy Treatment and Discharge  Patient Details Name: Micheal Gomez MRN: [id number] DOB: 06-02-1956 Today's Date: 08/19/2015    History of Present Illness 60 y/o male with "pre-diabetes", HTN, OSA admitted on 2/4 with acute encephalopathy and DKA with AKI and severe hypernatremia    PT Comments    Excellent progress this past week. Pt has safely completed all functional goals established and feels confident with his abilities. Tolerating higher level dynamic gait challenges with minimal difficulty. Safely navigates stairs.  Functioning at a supervision to Mod I level. Understands therapeutic exercises for strength, balance, and endurance. All questions have been answered and pt has no further concerns regarding mobility at this time. Signing-off. Thank you for this referral.   Follow Up Recommendations  Outpatient PT;Supervision - Intermittent     Equipment Recommendations  None recommended by PT    Recommendations for Other Services       Precautions / Restrictions Precautions Precautions: Fall Restrictions Weight Bearing Restrictions: No    Mobility  Bed Mobility               General bed mobility comments: in recliner  Transfers Overall transfer level: Independent                  Ambulation/Gait Ambulation/Gait assistance: Modified independent (Device/Increase time) Ambulation Distance (Feet): 300 Feet Assistive device: None Gait Pattern/deviations: Step-through pattern;Decreased stride length Gait velocity: decreased Gait velocity interpretation: Below normal speed for age/gender General Gait Details: No device used today, demonstrating improved balance with straight walking and better tolerance to dynamic challenges as well. No overt loss of balance requiring physical assistance. Instructions for safely navigating obstacles and monitoring symptoms of fatigue. Still feels a bit weak but endurance is improving per pt  report.   Stairs Stairs: Yes Stairs assistance: Supervision Stair Management: No rails;Alternating pattern;Step to pattern;Forwards;One rail Right Number of Stairs: 4 (x2) General stair comments: Practiced with single rail alternating steps and without rails using step to approach. Much improved. Still more stable and safe with use of rail for support. Verbalizes understanding.  Wheelchair Mobility    Modified Rankin (Stroke Patients Only)       Balance Overall balance assessment: Needs assistance               Single Leg Stance - Right Leg: 0 (requires UE support) Single Leg Stance - Left Leg: 0 (requires UE support) Tandem Stance - Right Leg: 30 Tandem Stance - Left Leg: 30 Rhomberg - Eyes Opened: 60 Rhomberg - Eyes Closed: 60 (Moderate sway)        Cognition Arousal/Alertness: Awake/alert Behavior During Therapy: WFL for tasks assessed/performed Overall Cognitive Status: Within Functional Limits for tasks assessed                      Exercises General Exercises - Lower Extremity Ankle Circles/Pumps: AROM;Both;Seated;10 reps Gluteal Sets: Strengthening;Both;Seated;10 reps Long Arc Quad: Strengthening;Both;10 reps;Seated Hip Flexion/Marching: Strengthening;Both;10 reps;Seated Other Exercises Other Exercises: With chair behind patient and table in front for UE support as needed. Performed single leg stance, tandem stance, and rhomberg stance (eyes closed). x30 seconds each    General Comments General comments (skin integrity, edema, etc.): Reviewed all exercises and handout. Verbalizes understanding      Pertinent Vitals/Pain Pain Assessment: No/denies pain    Home Living                      Prior Function  PT Goals (current goals can now be found in the care plan section) Acute Rehab PT Goals Patient Stated Goal: Go home PT Goal Formulation: With patient Time For Goal Achievement: 08/24/15 Potential to Achieve Goals:  Good Progress towards PT goals: Goals met/education completed, patient discharged from PT    Frequency  Min 3X/week    PT Plan Discharge plan needs to be updated    Co-evaluation             End of Session Equipment Utilized During Treatment: Gait belt Activity Tolerance: Patient tolerated treatment well Patient left: with call bell/phone within reach;in chair     Time: 1650-1706 PT Time Calculation (min) (ACUTE ONLY): 16 min  Charges:  $Gait Training: 8-22 mins                    G Codes:      Ellouise Newer September 16, 2015, 5:47 PM Camille Bal Lou[place], [place]

## 2015-08-19 NOTE — Progress Notes (Signed)
  Assessment:  1. Presumably ARF in face of DKA with ATN,  Recovery Phase 2. DKA, resolved 3. Hypokalemia - replace 4. Microhematuria/Pyuria  Plan: 1 Remove HD catheter 2 Will need follow up of microhematuria with Urology 3 If creat 7 or less I think safe for discharge with OP follow up     Subjective: Interval History: No complaints  Objective: Vital signs in last 24 hours: Temp:  [97.9 F (36.6 C)-98.9 F (37.2 C)] 97.9 F (36.6 C) (02/17 0700) Pulse Rate:  [82-93] 93 (02/17 0700) Resp:  [18-21] 20 (02/17 0700) BP: (131-150)/(65-68) 141/65 mmHg (02/17 0700) SpO2:  [98 %-99 %] 98 % (02/17 0700) Weight:  [115.214 kg (254 lb)] 115.214 kg (254 lb) (02/16 2224) Weight change:   Intake/Output from previous day: 02/16 0701 - 02/17 0700 In: 970 [P.O.:960; I.V.:10] Out: 1550 [Urine:1550] Intake/Output this shift: Total I/O In: -  Out: 275 [Urine:275]  General appearance: alert and cooperative Resp: clear to auscultation bilaterally Chest wall: no tenderness Extremities: edema 1=  Lab Results:  Recent Labs  08/17/15 1050  WBC 9.1  HGB 10.8*  HCT 32.3*  PLT 318   BMET:  Recent Labs  08/18/15 0546 08/19/15 0430  NA 132* 133*  K 3.5 3.4*  CL 98* 96*  CO2 20* 21*  GLUCOSE 148* 204*  BUN 80* 78*  CREATININE 10.94* 9.40*  CALCIUM 7.2* 7.4*   No results for input(s): PTH in the last 72 hours. Iron Studies: No results for input(s): IRON, TIBC, TRANSFERRIN, FERRITIN in the last 72 hours. Studies/Results: No results found.  Scheduled: . amLODipine  5 mg Oral Daily  . antiseptic oral rinse  7 mL Mouth Rinse q12n4p  . chlorhexidine  15 mL Mouth Rinse BID  . insulin aspart  0-15 Units Subcutaneous TID WC  . insulin aspart  0-5 Units Subcutaneous QHS  . insulin glargine  16 Units Subcutaneous Daily  . insulin starter kit- pen needles  1 kit Other Once  . living well with diabetes book   Does not apply Once  . pantoprazole  40 mg Oral BID    LOS: 14 days    Shenoa Hattabaugh C 08/19/2015,11:25 AM

## 2015-08-19 NOTE — Progress Notes (Signed)
Pt was on CPAP when RT entered room.  RT added sterile water to the water chamber for patient. Patient is tolerating CPAP.  RT will continue to monitor.

## 2015-08-20 LAB — RENAL FUNCTION PANEL
ANION GAP: 15 (ref 5–15)
Albumin: 2.9 g/dL — ABNORMAL LOW (ref 3.5–5.0)
BUN: 72 mg/dL — ABNORMAL HIGH (ref 6–20)
CHLORIDE: 98 mmol/L — AB (ref 101–111)
CO2: 21 mmol/L — AB (ref 22–32)
Calcium: 7.5 mg/dL — ABNORMAL LOW (ref 8.9–10.3)
Creatinine, Ser: 8.06 mg/dL — ABNORMAL HIGH (ref 0.61–1.24)
GFR calc non Af Amer: 6 mL/min — ABNORMAL LOW (ref >60)
GFR, EST AFRICAN AMERICAN: 7 mL/min — AB (ref >60)
Glucose, Bld: 145 mg/dL — ABNORMAL HIGH (ref 65–99)
Phosphorus: 6.8 mg/dL — ABNORMAL HIGH (ref 2.5–4.6)
Potassium: 3.7 mmol/L (ref 3.5–5.1)
Sodium: 134 mmol/L — ABNORMAL LOW (ref 135–145)

## 2015-08-20 LAB — GLUCOSE, CAPILLARY
GLUCOSE-CAPILLARY: 157 mg/dL — AB (ref 65–99)
GLUCOSE-CAPILLARY: 114 mg/dL — AB (ref 65–99)
Glucose-Capillary: 129 mg/dL — ABNORMAL HIGH (ref 65–99)
Glucose-Capillary: 150 mg/dL — ABNORMAL HIGH (ref 65–99)

## 2015-08-20 NOTE — Progress Notes (Signed)
Assessment:  1. AKI in face of DKA with ATN,  Recovery Phase 2. DKA, resolved 3. Hypokalemia - replace 4. Microhematuria/Pyuria  Plan: 2 Will need follow up of microhematuria with Urology 3 If creat 7 or less I think safe for discharge with appropriate OP follow up.  He uses the [place] but would benefit from a local MD.  Subjective: Interval History: He gets care at the [facility]  Objective: Vital signs in last 24 hours: Temp:  [97.8 F (36.6 C)-98.6 F (37 C)] 97.8 F (36.6 C) (02/18 0842) Pulse Rate:  [84-95] 84 (02/18 0842) Resp:  [18-22] 20 (02/18 0842) BP: (131-150)/(59-60) 150/60 mmHg (02/18 0842) SpO2:  [97 %-100 %] 97 % (02/18 0842) Weight:  [115.667 kg (255 lb)] 115.667 kg (255 lb) (02/17 2204) Weight change: 0.454 kg (1 lb)  Intake/Output from previous day: 02/17 0701 - 02/18 0700 In: 1080 [P.O.:1080] Out: 2475 [Urine:2475] Intake/Output this shift:    General appearance: alert and cooperative Resp: clear to auscultation bilaterally Chest wall: no tenderness Cardio: regular rate and rhythm, S1, S2 normal, no murmur, click, rub or gallop Extremities: edema tr  Lab Results: No results for input(s): WBC, HGB, HCT, PLT in the last 72 hours. BMET:  Recent Labs  08/19/15 0430 08/20/15 0652  NA 133* 134*  K 3.4* 3.7  CL 96* 98*  CO2 21* 21*  GLUCOSE 204* 145*  BUN 78* 72*  CREATININE 9.40* 8.06*  CALCIUM 7.4* 7.5*   No results for input(s): PTH in the last 72 hours. Iron Studies: No results for input(s): IRON, TIBC, TRANSFERRIN, FERRITIN in the last 72 hours. Studies/Results: No results found.  Scheduled: . amLODipine  5 mg Oral Daily  . antiseptic oral rinse  7 mL Mouth Rinse q12n4p  . chlorhexidine  15 mL Mouth Rinse BID  . insulin aspart  0-15 Units Subcutaneous TID WC  . insulin aspart  0-5 Units Subcutaneous QHS  . insulin glargine  16 Units Subcutaneous Daily  . insulin starter kit- pen needles  1 kit Other Once  . living well with diabetes book    Does not apply Once  . pantoprazole  40 mg Oral BID     LOS: 15 days   Sayra Frisby C 08/20/2015,10:57 AM

## 2015-08-20 NOTE — Progress Notes (Signed)
Patient refusing bed alarm 

## 2015-08-20 NOTE — Progress Notes (Signed)
[facility] PROGRESS NOTE  Micheal Gomez EZM:[id number] DOB: 01/28/1956 DOA: 08/05/2015 PCP: No primary care provider on file.  Brief narrative 60 year old male with history of hypertension, diabetes (recently diagnosed but not treated), recently diagnosed OSA and started on CPAP was found on the floor by his unsafe for altered mental status. He was brought to the ED where he was hypotensive with encephalopathy, had severe metabolic acidosis with positive beta hydroxybutyric acid . CBG was >600 blood work showed severe and gap acidosis with blood glucose of 1500. He also had acute kidney injury with BUN of 103 and creatinine of 6.48. Lactic acid was elevated to 3.93.  UA Showed hematuria, glycosuria with many bacteria. Also had rhabdomyolysis. Patient admitted to ICU and started on glucose stabilizer. Nephrology was consulted and patient started on temporary hemodialysis. DKA and metabolic acidosis resolved and patient transfer to stepdown unit.    Assessment/Plan: DKA in the setting of uncontrolled diabetes mellitus -A1c of 15.8. Required insulin drip on admission. Continue diabetic education and modified carb diet   -Continue current insulin regimen with sliding scale insulin, night coverage and Lantus  Acute kidney injury/ATN requiring temporary hemodialysis. -Presumed acute kidney injury in the setting of DKA. Compliments and serologies negative.  -Last dialysis on 2/10.  -Nephrology managing and reportedly patient is in the recovery phase -plan is to discharge when Cr less than 7 -HD catheter removed  Rhabdomyolysis -Possibly in the setting of severe dehydration from DKA.  -CK improving; will monitor intermittently -patient advise to maintain adequate hydration .  Essential hypertension -continue amlodipine. -BP is stable  Acute metabolic encephalopathy -In the setting of severe DKA and? Uremia.  -Now resolved. -patient oriented X 4  Transaminitis -LFT's within  normal limits on last check -continue intermittent monitoring  -most likely associated with rhabdo  thrombocytopenia -No clear etiology? Associated with rhabdo.  -Avoid heparin products and follow trend.  Macrocytic anemia Low iron saturation noted. -associated with chronic disease and ongoing renal failure -iron therapy and most likely epogen treatment as some point as per renal discretion  Diet: Renal/diabetic  Code Status: Full code  Family Communication: discussed with patient directly; no family at bedside  Disposition Plan: Home once renal function improved and no further dialysis needed. Please see above  Consultants: Renal PCCM   Procedures: HD catheter placement Abdominal ultrasound   Antibiotics:  none  HPI/Subjective: Pt has no new complaints today.   Objective: Filed Vitals:   08/20/15 0428 08/20/15 0842  BP: 131/59 150/60  Pulse: 89 84  Temp: 98.6 F (37 C) 97.8 F (36.6 C)  Resp: 21 20    Intake/Output Summary (Last 24 hours) at 08/20/15 1632 Last data filed at 08/20/15 1551  Gross per 24 hour  Intake   1200 ml  Output   1800 ml  Net   -600 ml   Filed Weights   08/16/15 2113 08/18/15 2224 08/19/15 2204  Weight: 122.3 kg (269 lb 10 oz) 115.214 kg (254 lb) 115.667 kg (255 lb)    Exam:   General: alert and awake, no fever, no CP and no SOB.   HEENT:  moist mucosa, right IJ in place (per renal service will be removed today, 2/17)  Chest: Clear bilaterally, no wheezes, no increased wob  CVS: Normal S1 and S2, no murmurs rub or gallop  GI: Soft, nondistended, nontender, bowel sounds present  Musculoskeletal: Warm, trace pitting edema bilaterally, no cyanosis    Data Reviewed: Basic Metabolic Panel:  Recent Labs Lab 08/15/15 0501  08/16/15 0655 08/17/15 1050 08/18/15 0546 08/19/15 0430 08/20/15 0652  NA 137 137 134* 132* 133* 134*  K 3.4* 3.4* 4.1 3.5 3.4* 3.7  CL 96* 96* 96* 98* 96* 98*  CO2 21* 20* 20* 20* 21* 21*   GLUCOSE 232* 159* 192* 148* 204* 145*  BUN 82* 80* 80* 80* 78* 72*  CREATININE 12.75* 12.63* 12.09* 10.94* 9.40* 8.06*  CALCIUM 7.4* 7.7* 7.8* 7.2* 7.4* 7.5*  PHOS 9.8*  --  9.8* 10.0* 8.5* 6.8*   Liver Function Tests:  Recent Labs Lab 08/14/15 0519  08/16/15 0655 08/17/15 1050 08/18/15 0546 08/19/15 0430 08/20/15 0652  AST 44*  --  23  --   --   --   --   ALT 94*  --  61  --   --   --   --   ALKPHOS 143*  --  152*  --   --   --   --   BILITOT 0.6  --  0.9  --   --   --   --   PROT 6.2*  --  7.3  --   --   --   --   ALBUMIN 2.6*  < > 2.7* 3.1* 2.7* 2.8* 2.9*  < > = values in this interval not displayed. No results for input(s): LIPASE, AMYLASE in the last 168 hours. No results for input(s): AMMONIA in the last 168 hours. CBC:  Recent Labs Lab 08/17/15 1050  WBC 9.1  HGB 10.8*  HCT 32.3*  MCV 77.8*  PLT 318   Cardiac Enzymes:  Recent Labs Lab 08/14/15 0519  CKTOTAL 485*   CBG:  Recent Labs Lab 08/19/15 1125 08/19/15 1630 08/19/15 2113 08/20/15 0748 08/20/15 1224  GLUCAP 78 136* 157* 129* 157*    Studies: No results found.  Scheduled Meds: . amLODipine  5 mg Oral Daily  . antiseptic oral rinse  7 mL Mouth Rinse q12n4p  . chlorhexidine  15 mL Mouth Rinse BID  . insulin aspart  0-15 Units Subcutaneous TID WC  . insulin aspart  0-5 Units Subcutaneous QHS  . insulin glargine  16 Units Subcutaneous Daily  . insulin starter kit- pen needles  1 kit Other Once  . living well with diabetes book   Does not apply Once  . pantoprazole  40 mg Oral BID   Continuous Infusions: . sodium chloride 10 mL/hr at 08/10/15 0854    Time spent: 30 minutes   Micheal Gomez, [facility]  [facility] Pager:[phone] If 7PM-7AM, please contact night-coverage at [email or web address], password [facility] 08/20/2015, 4:32 PM  LOS: 15 days

## 2015-08-20 NOTE — Progress Notes (Signed)
Patient is a high fall risk. Patient refused bed alarm and chair alarm. Non-skid socks applied. Patient has been educated on risks. Patient verbalized understanding. RN will continue to monitor patient.  Ermalinda Memos, RN

## 2015-08-21 LAB — RENAL FUNCTION PANEL
ALBUMIN: 2.9 g/dL — AB (ref 3.5–5.0)
ANION GAP: 12 (ref 5–15)
BUN: 65 mg/dL — AB (ref 6–20)
CALCIUM: 7.3 mg/dL — AB (ref 8.9–10.3)
CO2: 21 mmol/L — ABNORMAL LOW (ref 22–32)
Chloride: 101 mmol/L (ref 101–111)
Creatinine, Ser: 6.64 mg/dL — ABNORMAL HIGH (ref 0.61–1.24)
GFR calc Af Amer: 9 mL/min — ABNORMAL LOW (ref >60)
GFR, EST NON AFRICAN AMERICAN: 8 mL/min — AB (ref >60)
GLUCOSE: 140 mg/dL — AB (ref 65–99)
PHOSPHORUS: 5.8 mg/dL — AB (ref 2.5–4.6)
Potassium: 3.8 mmol/L (ref 3.5–5.1)
SODIUM: 134 mmol/L — AB (ref 135–145)

## 2015-08-21 LAB — GLUCOSE, CAPILLARY
GLUCOSE-CAPILLARY: 122 mg/dL — AB (ref 65–99)
Glucose-Capillary: 98 mg/dL (ref 65–99)

## 2015-08-21 MED ORDER — BLOOD GLUCOSE METER KIT: PACK | Status: AC

## 2015-08-21 MED ORDER — INSULIN STARTER KIT- PEN NEEDLES (ENGLISH): 1.0000 | Freq: Once | Status: AC

## 2015-08-21 MED ORDER — AMLODIPINE BESYLATE 5 MG PO TABS
5.0000 mg | ORAL_TABLET | Freq: Every day | ORAL | Status: DC
Start: 2015-08-21 — End: 2021-06-05

## 2015-08-21 MED ORDER — INSULIN DETEMIR 100 UNIT/ML ~~LOC~~ SOLN: 16.0000 [IU] | Freq: Every day | SUBCUTANEOUS | Status: DC

## 2015-08-21 NOTE — Progress Notes (Signed)
Assessment:  1. AKI in face of DKA with ATN,  Recovery Phase 2. DKA, resolved 3. Hypokalemia - resolved 4. Microhematuria/Pyuria  Plan: 1 Will need follow up of microhematuria with Urology and I informed him 2 He understands he needs labs done this week by [facility] physicians.  Subjective: Interval History: ok  Objective: Vital signs in last 24 hours: Temp:  [97.4 F (36.3 C)-98.6 F (37 C)] 98.6 F (37 C) (02/19 0900) Pulse Rate:  [87-98] 92 (02/19 0900) Resp:  [18-20] 18 (02/19 0900) BP: (135-150)/(55-73) 147/71 mmHg (02/19 0900) SpO2:  [98 %-100 %] 99 % (02/19 0900) Weight:  [115.5 kg (254 lb 10.1 oz)] 115.5 kg (254 lb 10.1 oz) (02/18 2055) Weight change: -0.167 kg (-5.9 oz)  Intake/Output from previous day: 02/18 0701 - 02/19 0700 In: 600 [P.O.:600] Out: 1450 [Urine:1450] Intake/Output this shift: Total I/O In: -  Out: 650 [Urine:650]  no change in exam  Lab Results: No results for input(s): WBC, HGB, HCT, PLT in the last 72 hours. BMET:  Recent Labs  08/20/15 0652 08/21/15 0532  NA 134* 134*  K 3.7 3.8  CL 98* 101  CO2 21* 21*  GLUCOSE 145* 140*  BUN 72* 65*  CREATININE 8.06* 6.64*  CALCIUM 7.5* 7.3*   No results for input(s): PTH in the last 72 hours. Iron Studies: No results for input(s): IRON, TIBC, TRANSFERRIN, FERRITIN in the last 72 hours. Studies/Results: No results found.  Scheduled: . amLODipine  5 mg Oral Daily  . antiseptic oral rinse  7 mL Mouth Rinse q12n4p  . chlorhexidine  15 mL Mouth Rinse BID  . insulin aspart  0-15 Units Subcutaneous TID WC  . insulin aspart  0-5 Units Subcutaneous QHS  . insulin glargine  16 Units Subcutaneous Daily  . insulin starter kit- pen needles  1 kit Other Once  . living well with diabetes book   Does not apply Once  . pantoprazole  40 mg Oral BID     LOS: 16 days   Salome Cozby C 08/21/2015,4:13 PM

## 2015-08-21 NOTE — Progress Notes (Signed)
Discharge instructions and medications discussed with patient.  Prescriptions given to patient.  All questions answered.  

## 2015-08-21 NOTE — Discharge Summary (Signed)
Physician Discharge Summary  Micheal Gomez ATF:[id number] DOB: 14-Oct-1955 DOA: 08/05/2015  PCP: No primary care provider on file.  Admit date: 08/05/2015 Discharge date: 08/21/2015  Time spent: > 35 minutes  Recommendations for Outpatient Follow-up:  1. Monitor serum creatinine 2. Patient to f/u with Urologist for microhematuria 3. Will need f/u with Nephrologist 4. Please see below   Discharge Diagnoses:  Active Problems:   DKA (diabetic ketoacidoses) (HCC)   Acute renal failure (HCC)   Diabetic ketoacidosis with coma associated with type 2 diabetes mellitus ([place])   End-stage renal disease on hemodialysis ([place])   Non-traumatic rhabdomyolysis   OSA on CPAP   Obesity hypoventilation syndrome ([place])   Acute encephalopathy   Transaminitis   Hyperglycemia   Discharge Condition: stable  Diet recommendation: diabetic diet  Filed Weights   08/18/15 2224 08/19/15 2204 08/20/15 2055  Weight: 115.214 kg (254 lb) 115.667 kg (255 lb) 115.5 kg (254 lb 10.1 oz)    History of present illness:  60 year old male with history of hypertension, diabetes (recently diagnosed but not treated), recently diagnosed OSA and started on CPAP was found on the floor by his unsafe for altered mental status. He was brought to the ED where he was hypotensive with encephalopathy, had severe metabolic acidosis with positive beta hydroxybutyric acid . CBG was >600 blood work showed severe and gap acidosis with blood glucose of 1500. He also had acute kidney injury with BUN of 103 and creatinine of 6.48. Lactic acid was elevated to 3.93.  UA Showed hematuria, glycosuria with many bacteria. Also had rhabdomyolysis. Patient admitted to ICU and started on glucose stabilizer. Nephrology was consulted and patient started on temporary hemodialysis.  Hospital Course:  DKA in the setting of uncontrolled diabetes mellitus - A1c of 15.8. Required insulin drip on admission. Continue diabetic education and modified carb  diet  - Patient transitioned to Lantus while in house. Due to money concerns will discharge on levemir. - Will provide script for insulin meter, strips, lancets, syringes   Acute kidney injury/ATN requiring temporary hemodialysis. -Presumed acute kidney injury in the setting of DKA. Compliments and serologies negative.  -Last dialysis on 2/10.  -Nephrology managing and reportedly patient is in the recovery phase -Pt to f/u with nephrologist as outpatient.  Rhabdomyolysis -Possibly in the setting of severe dehydration from DKA.  -resolving  Essential hypertension -continue amlodipine.  Acute metabolic encephalopathy -In the setting of severe DKA and? Uremia.  -Now resolved. -patient oriented X 4  Transaminitis -LFT's within normal limits on last check -continue intermittent monitoring as outpatient -most likely associated with rhabdo  thrombocytopenia -No clear etiology? Associated with rhabdo.   Macrocytic anemia Low iron saturation noted. -associated with chronic disease and ongoing renal failure -iron therapy and most likely epogen treatment as some point as per renal discretion   Procedures:  None  Consultations:  Nephrology  Discharge Exam: Filed Vitals:   08/21/15 0414 08/21/15 0900  BP: 150/73 147/71  Pulse: 98 92  Temp: 98.2 F (36.8 C) 98.6 F (37 C)  Resp: 20 18    General: Pt in nad, alert and awake Cardiovascular: rrr, no mrg Respiratory: cta bl, no wheezes  Discharge Instructions   Discharge Instructions    For home use only DME Glucometer    Complete by:  As directed           Current Discharge Medication List    START taking these medications   Details  Alcohol Swabs 70 % PADS 1 application by Does  not apply route daily. Qty: 100 each, Refills: 0    amLODipine (NORVASC) 5 MG tablet Take 1 tablet (5 mg total) by mouth daily. Qty: 30 tablet, Refills: 0    blood glucose meter kit and supplies Dispense based on patient and  insurance preference. Use up to four times daily as directed. (FOR ICD-9 250.00, 250.01). Qty: 1 each, Refills: 0    glucose blood (FREESTYLE LITE) test strip Use as instructed Qty: 100 each, Refills: 12    insulin detemir (LEVEMIR) 100 UNIT/ML injection Inject 0.16 mLs (16 Units total) into the skin daily. Qty: 10 mL, Refills: 0    insulin starter kit- pen needles MISC 1 kit by Other route once. Qty: 1 each, Refills: 1    INSULIN SYRINGE .5CC/28G 28G X 1/2" 0.5 ML MISC 1 application by Does not apply route daily. Qty: 100 each, Refills: 0      CONTINUE these medications which have NOT CHANGED   Details  Multiple Vitamins-Minerals (MULTIVITAMIN WITH MINERALS) tablet Take 1 tablet by mouth daily.    omeprazole (PRILOSEC) 20 MG capsule Take 40 mg by mouth daily.      STOP taking these medications     aspirin 500 MG tablet      CALCIUM PO      chlorthalidone (HYGROTON) 25 MG tablet      metoprolol (LOPRESSOR) 50 MG tablet      OVER THE COUNTER MEDICATION        No Known Allergies    The results of significant diagnostics from this hospitalization (including imaging, microbiology, ancillary and laboratory) are listed below for reference.    Significant Diagnostic Studies: Dg Chest 1 View  08/05/2015  CLINICAL DATA:  Altered mental status EXAM: CHEST 1 VIEW COMPARISON:  08/26/2013 FINDINGS: Borderline cardiomegaly. Study is limited by poor inspiration and patient's large body habitus. No gross infiltrate or pulmonary edema. Mild left basilar atelectasis. IMPRESSION: Limited study by patient's large body habitus. No gross infiltrate or pulmonary edema. Cardiomegaly. Left basilar atelectasis. Electronically Signed   By: Lahoma Crocker M.D.   On: 08/05/2015 14:18   US Abdomen Complete  08/11/2015  CLINICAL DATA:  Transaminitis. EXAM: ABDOMEN ULTRASOUND COMPLETE COMPARISON:  None. FINDINGS: Gallbladder: No gallstones or wall thickening visualized. No sonographic Murphy sign noted by  sonographer. Common bile duct: Diameter: 3.6 mm Liver: Diffusely increased echogenicity with decreased through transmission of the sound beam. No focal mass or lesion. IVC: No abnormality visualized. Pancreas: Not well visualized. Spleen: Size and appearance within normal limits. Right Kidney: Length: 11.7 cm. Echogenicity within normal limits. No mass or hydronephrosis visualized. Left Kidney: Length: 11.1 cm. Echogenicity within normal limits. No mass or hydronephrosis visualized. Abdominal aorta: No aneurysm visualized. Other findings: None. IMPRESSION: 1. No acute findings. Normal gallbladder with no bile duct dilation. 2. Diffuse hepatic steatosis. Electronically Signed   By: Lajean Manes M.D.   On: 08/11/2015 20:21   US Renal  08/06/2015  CLINICAL DATA:  Acute renal failure, sepsis EXAM: RENAL / URINARY TRACT ULTRASOUND COMPLETE COMPARISON:  None. FINDINGS: Right Kidney: Length: 13.4 cm. Echogenicity within normal limits. No mass or hydronephrosis visualized. Left Kidney: Length: 12.7 cm. Echogenicity within normal limits. No mass or hydronephrosis visualized. Bladder: Not evaluated.  The bladder is decompressed by a Foley catheter. IMPRESSION: The kidneys appear normal. Electronically Signed   By: Skipper Cliche M.D.   On: 08/06/2015 21:01   Dg Chest Port 1 View  08/07/2015  CLINICAL DATA:  Central line placement EXAM:  PORTABLE CHEST 1 VIEW COMPARISON:  08/05/2015 FINDINGS: Right jugular central venous catheter tip in the mid SVC. No pneumothorax. The lungs are clear. No edema or effusion. Negative for pneumonia. Improvement in bibasilar atelectasis. IMPRESSION: Satisfactory central venous catheter placement Electronically Signed   By: Franchot Gallo M.D.   On: 08/07/2015 11:05   Dg Abd Portable 1v  08/07/2015  CLINICAL DATA:  NG tube placement EXAM: PORTABLE ABDOMEN - 1 VIEW COMPARISON:  08/07/2015 chest x-ray FINDINGS: Nasogastric tube has been placed, tip overlying the level of the very proximal  stomach. Bowel gas pattern is nonobstructive. IMPRESSION: Nasogastric tube tip overlying the proximal stomach. Consider advancing the tube an additional 5-10 cm. Electronically Signed   By: Nolon Nations M.D.   On: 08/07/2015 18:07    Microbiology: No results found for this or any previous visit (from the past 240 hour(s)).   Labs: Basic Metabolic Panel:  Recent Labs Lab 08/17/15 1050 08/18/15 0546 08/19/15 0430 08/20/15 0652 08/21/15 0532  NA 134* 132* 133* 134* 134*  K 4.1 3.5 3.4* 3.7 3.8  CL 96* 98* 96* 98* 101  CO2 20* 20* 21* 21* 21*  GLUCOSE 192* 148* 204* 145* 140*  BUN 80* 80* 78* 72* 65*  CREATININE 12.09* 10.94* 9.40* 8.06* 6.64*  CALCIUM 7.8* 7.2* 7.4* 7.5* 7.3*  PHOS 9.8* 10.0* 8.5* 6.8* 5.8*   Liver Function Tests:  Recent Labs Lab 08/16/15 0655 08/17/15 1050 08/18/15 0546 08/19/15 0430 08/20/15 0652 08/21/15 0532  AST 23  --   --   --   --   --   ALT 61  --   --   --   --   --   ALKPHOS 152*  --   --   --   --   --   BILITOT 0.9  --   --   --   --   --   PROT 7.3  --   --   --   --   --   ALBUMIN 2.7* 3.1* 2.7* 2.8* 2.9* 2.9*   No results for input(s): LIPASE, AMYLASE in the last 168 hours. No results for input(s): AMMONIA in the last 168 hours. CBC:  Recent Labs Lab 08/17/15 1050  WBC 9.1  HGB 10.8*  HCT 32.3*  MCV 77.8*  PLT 318   Cardiac Enzymes: No results for input(s): CKTOTAL, CKMB, CKMBINDEX, TROPONINI in the last 168 hours. BNP: BNP (last 3 results) No results for input(s): BNP in the last 8760 hours.  ProBNP (last 3 results) No results for input(s): PROBNP in the last 8760 hours.  CBG:  Recent Labs Lab 08/20/15 1224 08/20/15 1750 08/20/15 2053 08/21/15 0800 08/21/15 1214  GLUCAP 157* 114* 150* 122* 98    Signed:  Velvet Bathe MD.  [facility] 08/21/2015, 12:57 PM

## 2015-08-21 NOTE — Progress Notes (Signed)
Patient places himself on CPAP and doing well. Will call if assistance needed

## 2015-09-03 IMAGING — US US RENAL
1 series · 14 of 25 positions shown · non-contrast
Comparison: None.

CLINICAL DATA: Acute renal failure, sepsis

EXAM:
RENAL / URINARY TRACT ULTRASOUND COMPLETE

[Series 1: us renal · 0.25mm/px · 14 of 25 slices shown]
[im 1/25]
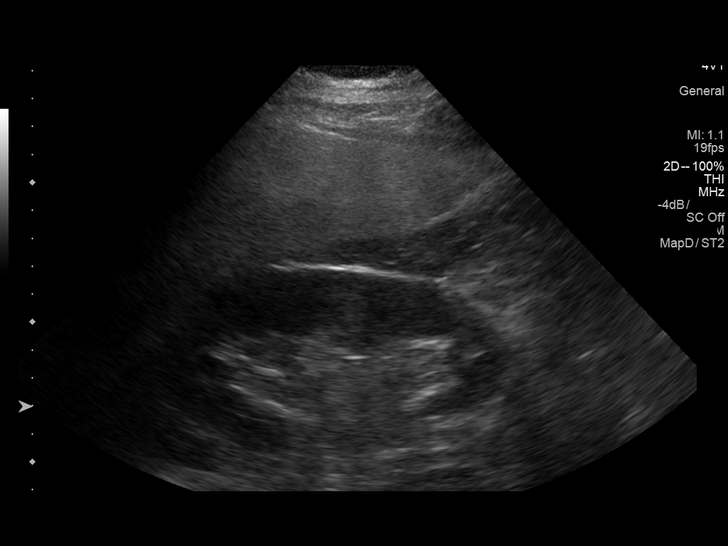
[im 3/25]
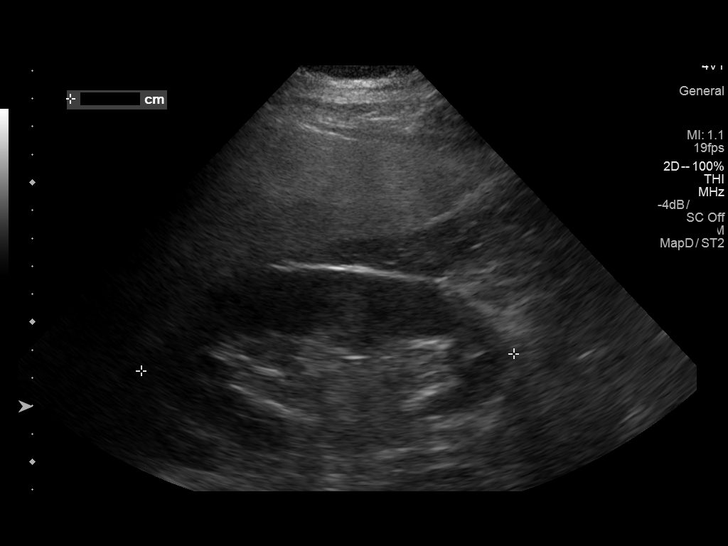
[im 5/25]
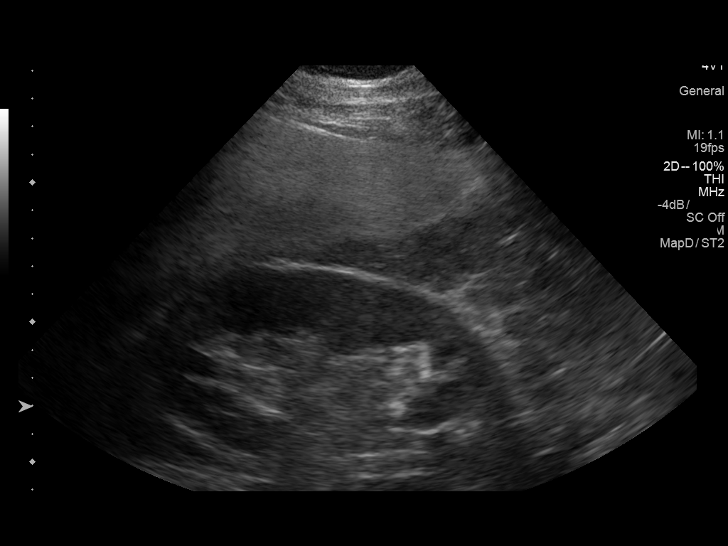
[im 7/25]
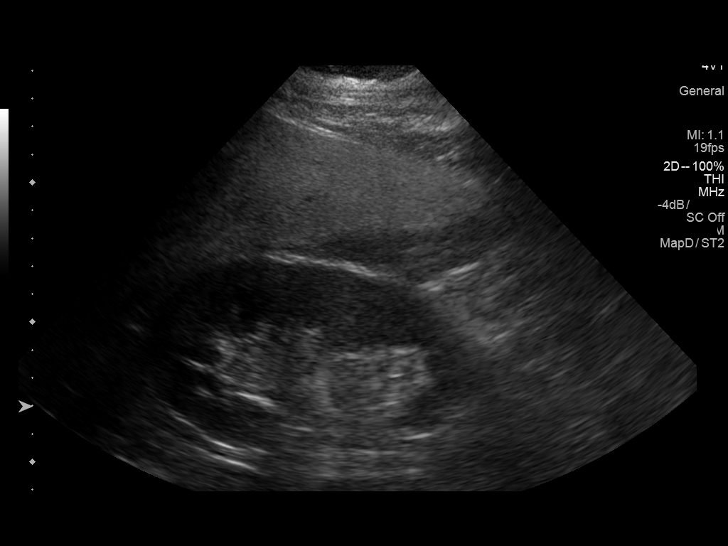
[im 9/25]
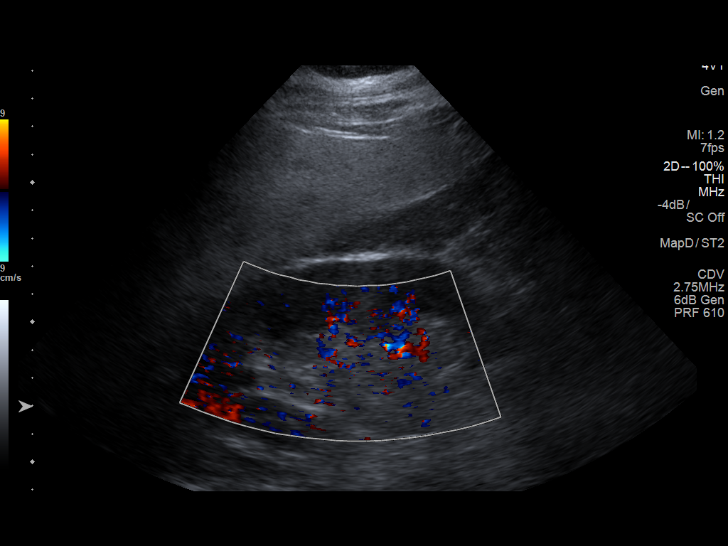
[im 10/25]
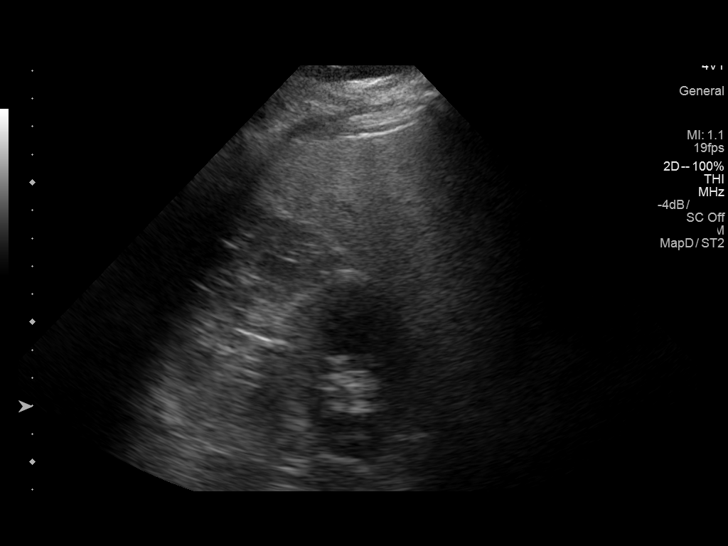
[im 12/25]
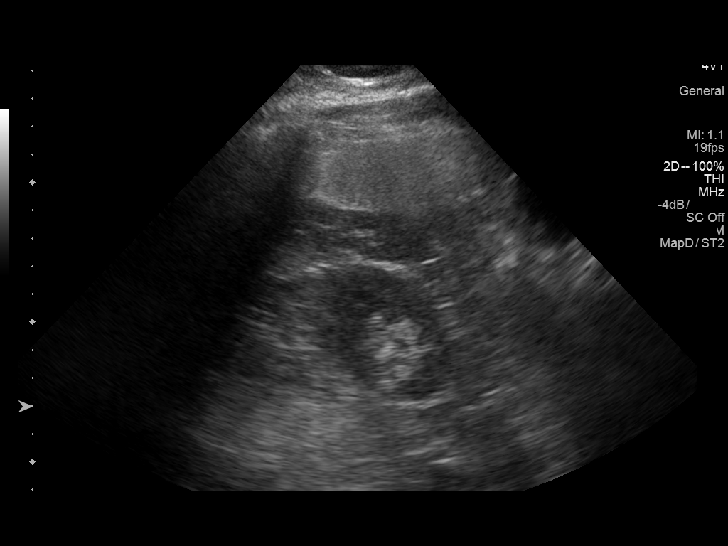
[im 14/25]
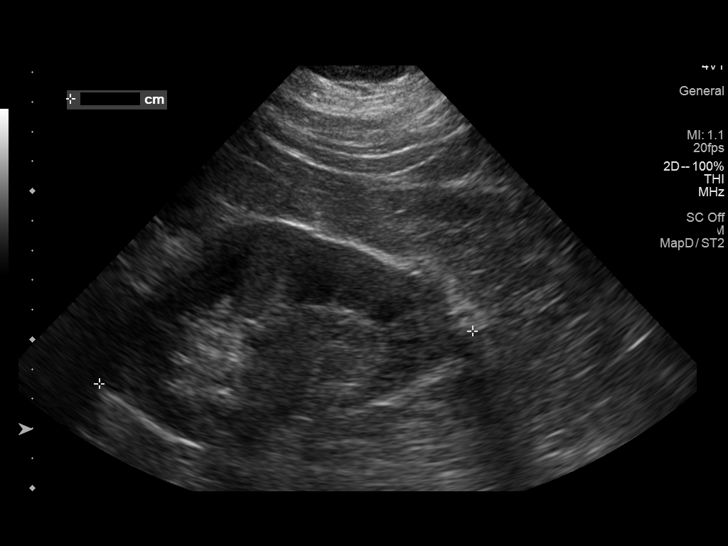
[im 16/25]
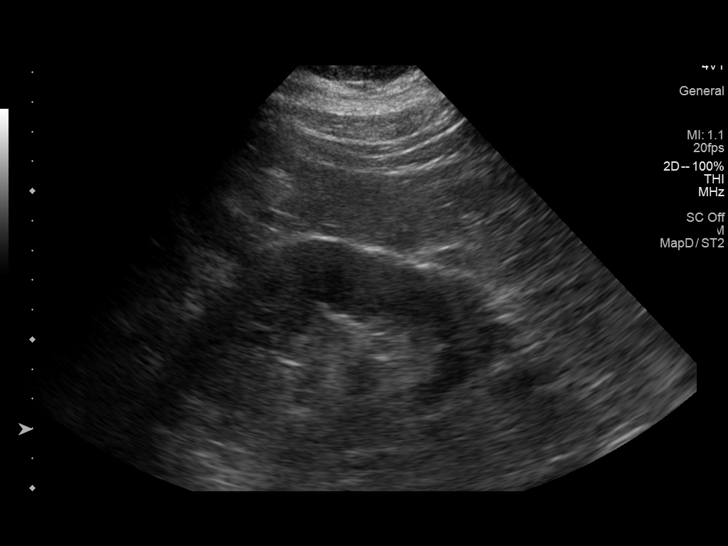
[im 17/25]
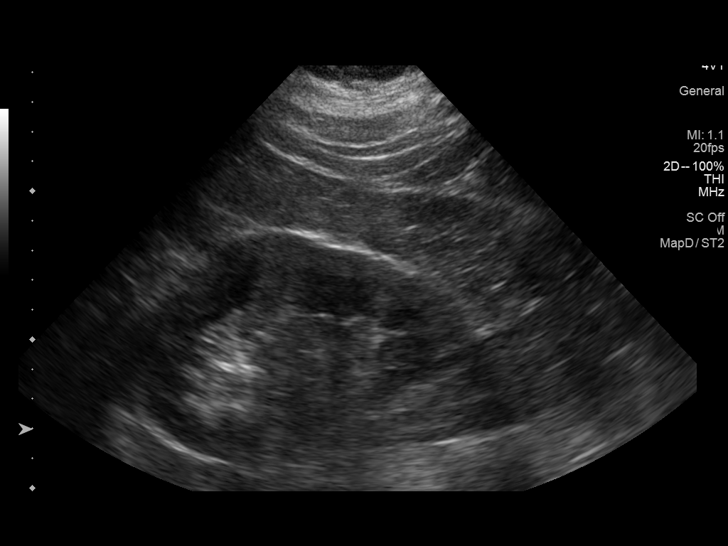
[im 19/25]
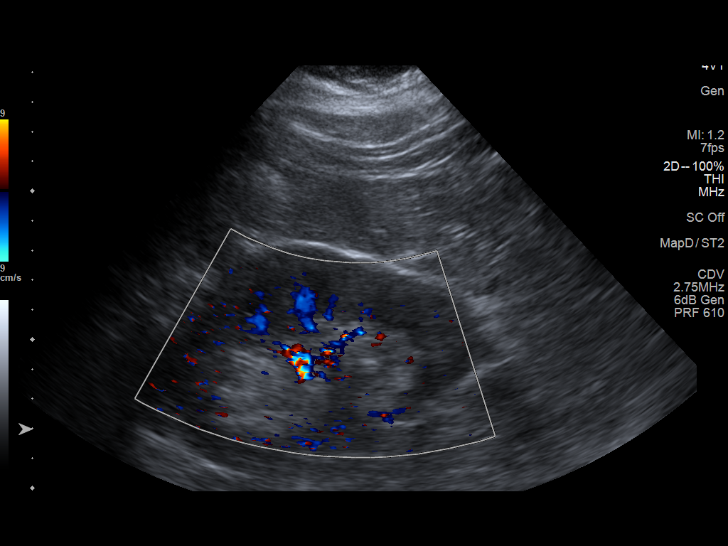
[im 21/25]
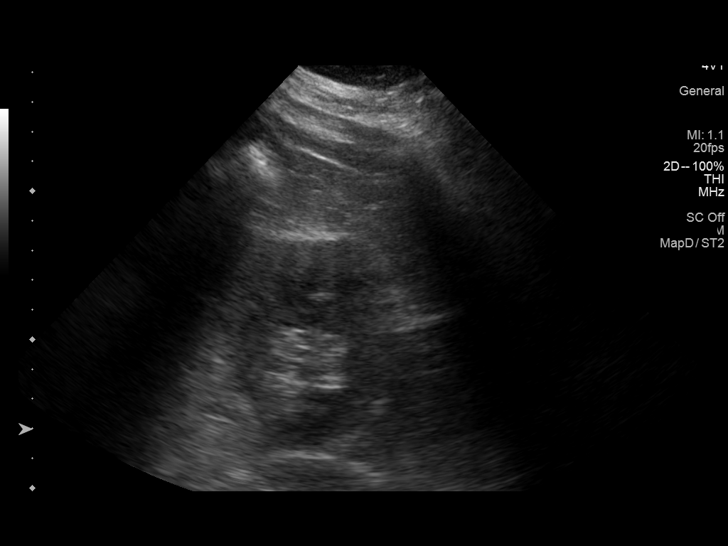
[im 23/25]
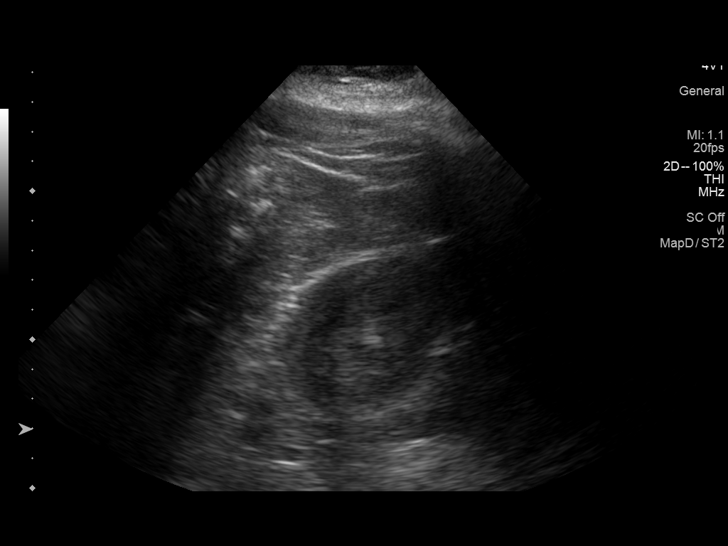
[im 25/25]
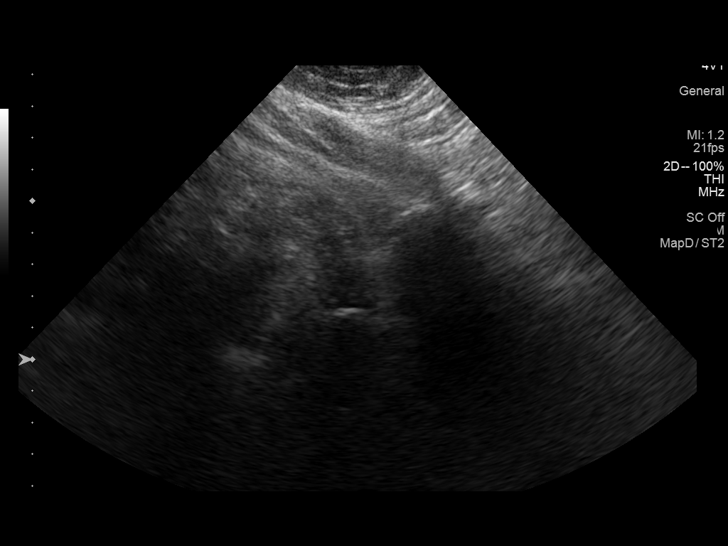

[14 of 25 positions shown; findings below may reference images not displayed]

FINDINGS: Right Kidney:

Length: 13.4 cm. Echogenicity within normal limits. No mass or
hydronephrosis visualized.

Left Kidney:

Length: 12.7 cm. Echogenicity within normal limits. No mass or
hydronephrosis visualized.

Bladder:

Not evaluated.  The bladder is decompressed by a Foley catheter.
IMPRESSION: The kidneys appear normal.

## 2015-09-08 IMAGING — US US ABDOMEN COMPLETE
1 series · 14 of 25 positions shown · non-contrast
Comparison: None.

CLINICAL DATA: Transaminitis.

EXAM:
ABDOMEN ULTRASOUND COMPLETE

[Series 1: us abdomen complete · 0.25mm/px · 14 of 75 slices shown]
[im 1/75]
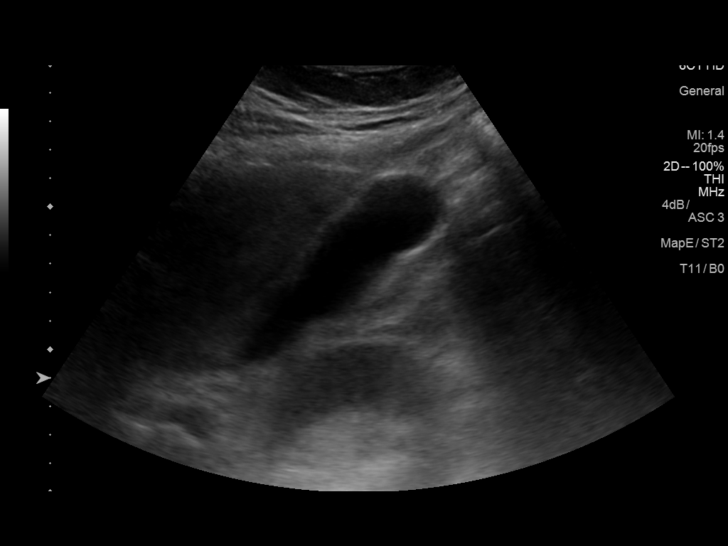
[im 7/75]
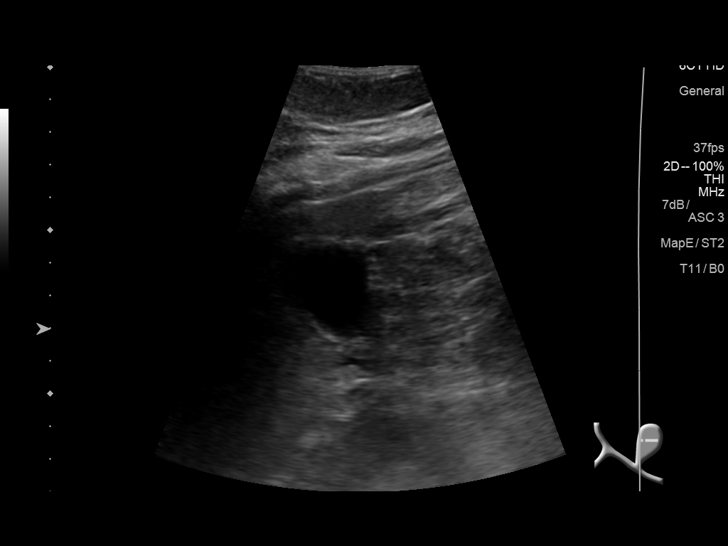
[im 13/75]
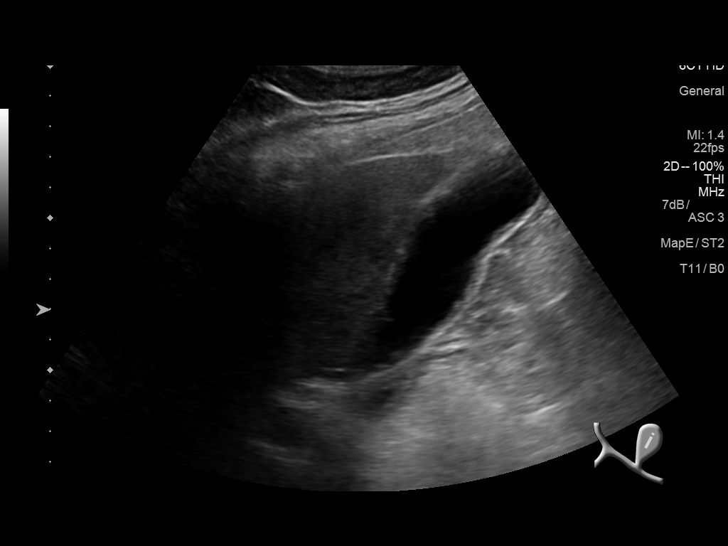
[im 19/75]
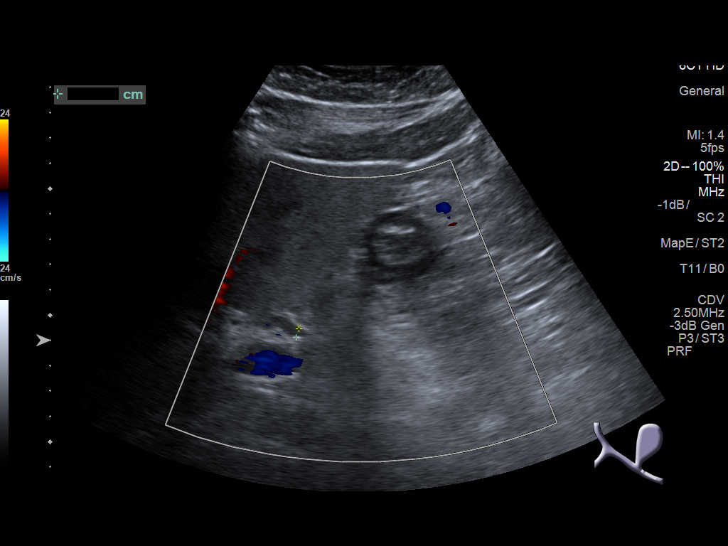
[im 25/75]
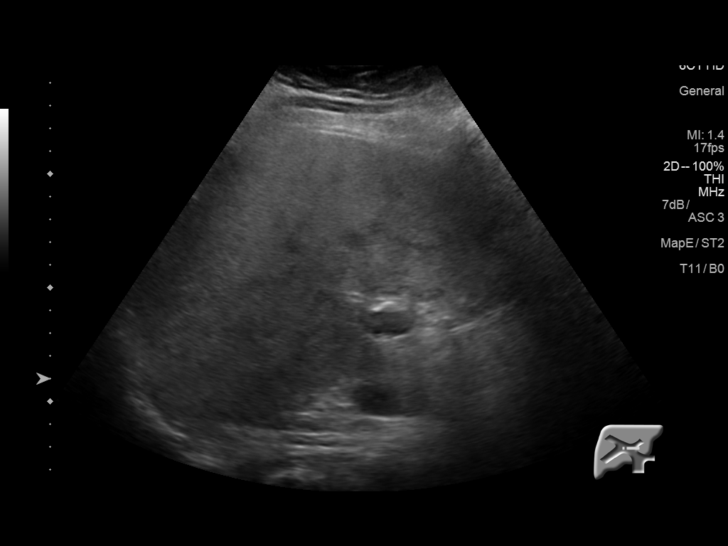
[im 28/75]
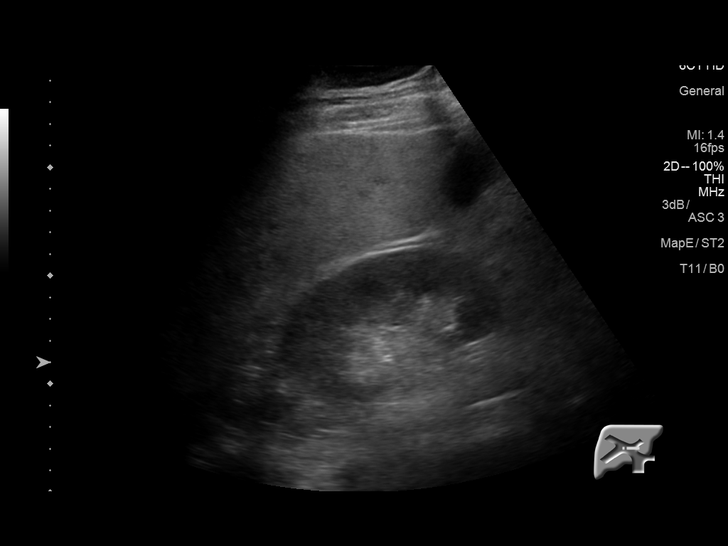
[im 34/75]
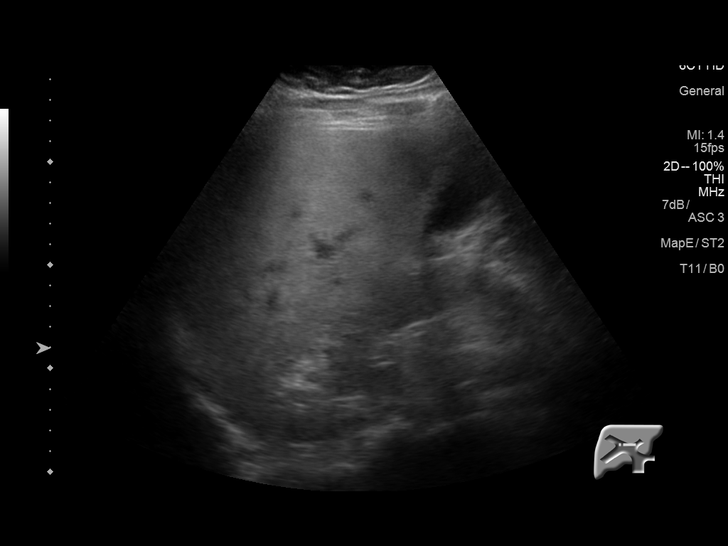
[im 41/75]
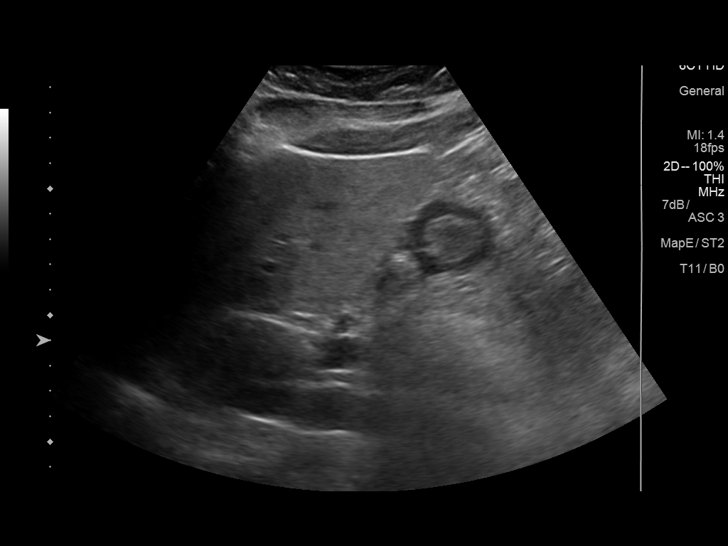
[im 47/75]
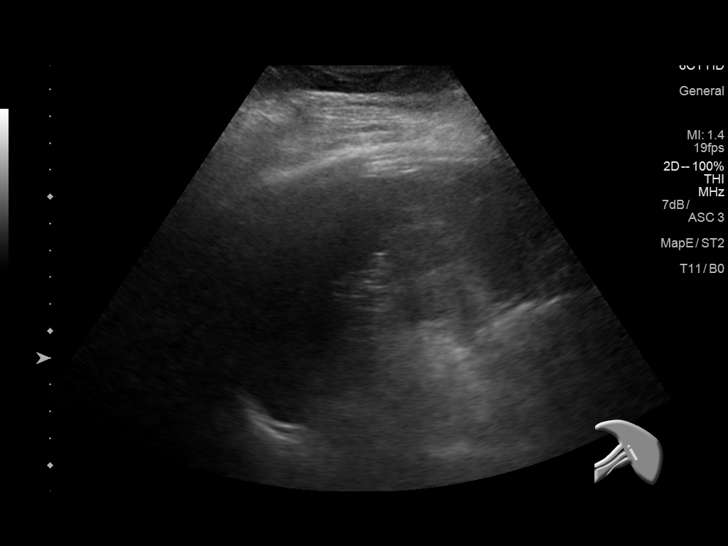
[im 50/75]
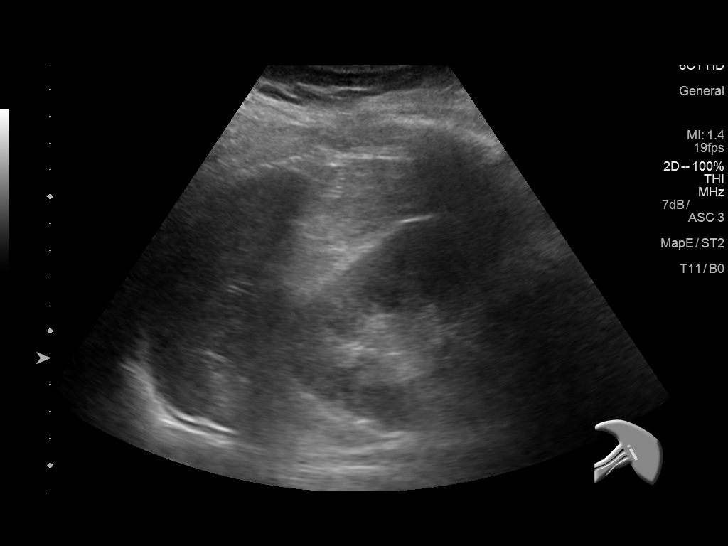
[im 56/75]
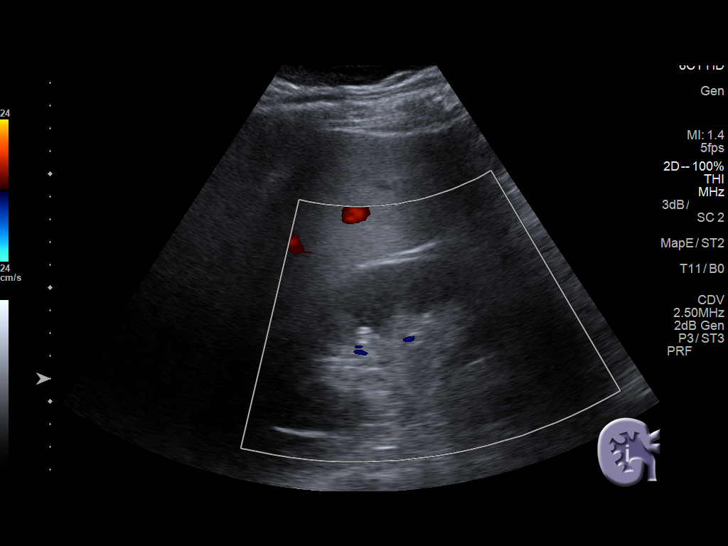
[im 62/75]
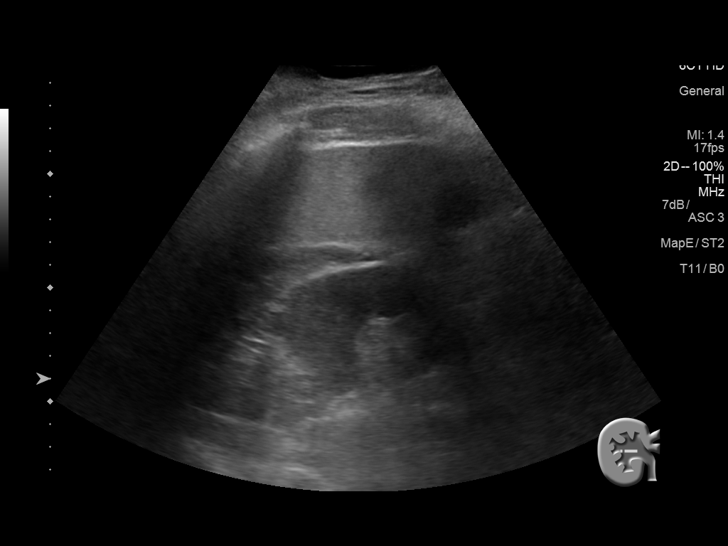
[im 68/75]
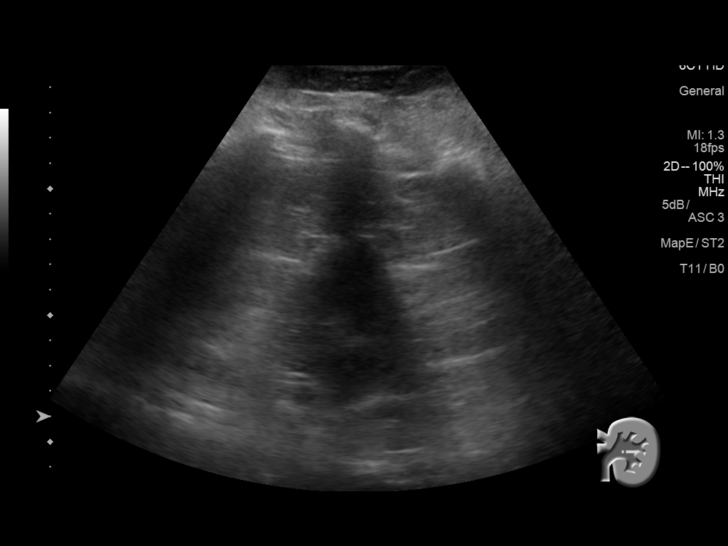
[im 75/75]
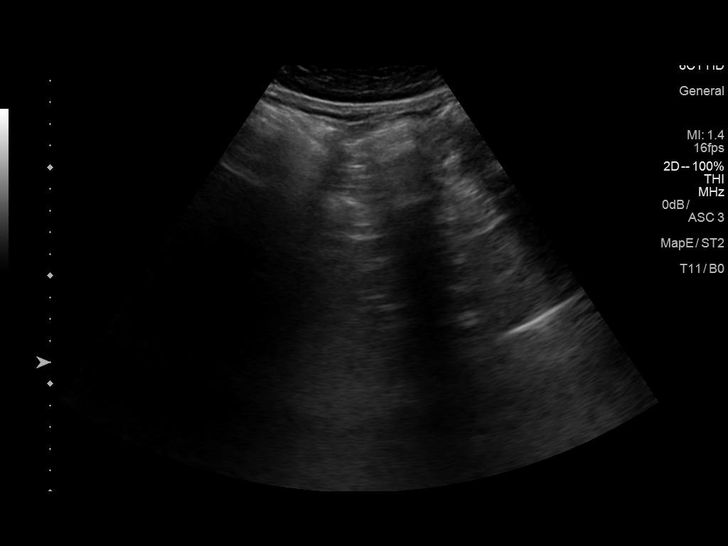

[14 of 25 positions shown; findings below may reference images not displayed]

FINDINGS: Gallbladder: No gallstones or wall thickening visualized. No
sonographic Murphy sign noted by sonographer.

Common bile duct: Diameter: 3.6 mm

Liver: Diffusely increased echogenicity with decreased through
transmission of the sound beam. No focal mass or lesion.

IVC: No abnormality visualized.

Pancreas: Not well visualized.

Spleen: Size and appearance within normal limits.

Right Kidney: Length: 11.7 cm. Echogenicity within normal limits. No
mass or hydronephrosis visualized.

Left Kidney: Length: 11.1 cm. Echogenicity within normal limits. No
mass or hydronephrosis visualized.

Abdominal aorta: No aneurysm visualized.

Other findings: None.
IMPRESSION: 1. No acute findings. Normal gallbladder with no bile duct dilation.
2. Diffuse hepatic steatosis.

## 2017-05-04 ENCOUNTER — Encounter (HOSPITAL_COMMUNITY): Payer: Self-pay | Admitting: Emergency Medicine

## 2017-05-04 ENCOUNTER — Emergency Department (HOSPITAL_COMMUNITY)
Admission: EM | Admit: 2017-05-04 | Discharge: 2017-05-05 | Disposition: A | Discharge status: Home / Self Care | Payer: Non-veteran care | Attending: Emergency Medicine | Admitting: Emergency Medicine

## 2017-05-04 DIAGNOSIS — R739 Hyperglycemia, unspecified: Secondary | ICD-10-CM

## 2017-05-04 DIAGNOSIS — Z794 Long term (current) use of insulin: Secondary | ICD-10-CM | POA: Insufficient documentation

## 2017-05-04 DIAGNOSIS — N186 End stage renal disease: Secondary | ICD-10-CM | POA: Insufficient documentation

## 2017-05-04 DIAGNOSIS — Z79899 Other long term (current) drug therapy: Secondary | ICD-10-CM | POA: Insufficient documentation

## 2017-05-04 DIAGNOSIS — Z992 Dependence on renal dialysis: Secondary | ICD-10-CM | POA: Insufficient documentation

## 2017-05-04 DIAGNOSIS — I120 Hypertensive chronic kidney disease with stage 5 chronic kidney disease or end stage renal disease: Secondary | ICD-10-CM | POA: Insufficient documentation

## 2017-05-04 DIAGNOSIS — E1065 Type 1 diabetes mellitus with hyperglycemia: Secondary | ICD-10-CM | POA: Insufficient documentation

## 2017-05-04 DIAGNOSIS — E1022 Type 1 diabetes mellitus with diabetic chronic kidney disease: Secondary | ICD-10-CM | POA: Insufficient documentation

## 2017-05-04 LAB — BASIC METABOLIC PANEL
ANION GAP: 7 (ref 5–15)
BUN: 16 mg/dL (ref 6–20)
CALCIUM: 8.6 mg/dL — AB (ref 8.9–10.3)
CO2: 24 mmol/L (ref 22–32)
Chloride: 104 mmol/L (ref 101–111)
Creatinine, Ser: 1.26 mg/dL — ABNORMAL HIGH (ref 0.61–1.24)
GFR calc Af Amer: >60 mL/min (ref >60)
GFR, EST NON AFRICAN AMERICAN: 59 mL/min — AB (ref >60)
Glucose, Bld: 283 mg/dL — ABNORMAL HIGH (ref 65–99)
POTASSIUM: 4.1 mmol/L (ref 3.5–5.1)
SODIUM: 135 mmol/L (ref 135–145)

## 2017-05-04 LAB — URINALYSIS, ROUTINE W REFLEX MICROSCOPIC
BACTERIA UA: NONE SEEN
Bilirubin Urine: NEGATIVE
Glucose, UA: >=500 mg/dL — AB
HGB URINE DIPSTICK: NEGATIVE
KETONES UR: NEGATIVE mg/dL
LEUKOCYTES UA: NEGATIVE
NITRITE: NEGATIVE
PH: 6 (ref 5.0–8.0)
Protein, ur: NEGATIVE mg/dL
SPECIFIC GRAVITY, URINE: 1.016 (ref 1.005–1.030)

## 2017-05-04 LAB — CBC
HEMATOCRIT: 39.2 % (ref 39.0–52.0)
Hemoglobin: 12.9 g/dL — ABNORMAL LOW (ref 13.0–17.0)
MCH: 26.7 pg (ref 26.0–34.0)
MCHC: 32.9 g/dL (ref 30.0–36.0)
MCV: 81 fL (ref 78.0–100.0)
Platelets: 190 10*3/uL (ref 150–400)
RBC: 4.84 MIL/uL (ref 4.22–5.81)
RDW: 14.9 % (ref 11.5–15.5)
WBC: 7.4 10*3/uL (ref 4.0–10.5)

## 2017-05-04 LAB — CBG MONITORING, ED: GLUCOSE-CAPILLARY: 268 mg/dL — AB (ref 65–99)

## 2017-05-04 NOTE — ED Triage Notes (Signed)
Pt reports that blood sugar reading was 476 after taking insulin. Pt also reports having nausea and vomiting for the last several days.

## 2017-05-04 NOTE — ED Provider Notes (Signed)
[facility] DEPT Provider Note   CSN: [id number] Arrival date & time: 05/04/17  1843     History   Chief Complaint Chief Complaint  Patient presents with  . Hyperglycemia    HPI   Blood pressure (!) 163/90, pulse 97, temperature 97.9 F (36.6 C), temperature source Oral, resp. rate 16, height '5\' 9"'$  (1.753 m), weight 136.1 kg (300 lb), SpO2 99 %.  Micheal Gomez is a 61 y.o. male with past medical history significant for insulin-dependent diabetes complaining of elevated blood sugar this evening, it was originally mid 400s, he took a shot of insulin and it continued to be high so he was advised to present to the ED, he initially felt sweaty with no chest pain, shortness of breath, nausea, vomiting, fever chills or anything out of the norm up until the last few days   Past Medical History:  Diagnosis Date  . Diabetes mellitus without complication ([place])   . Hypertension     Patient Active Problem List   Diagnosis Date Noted  . Transaminitis   . Hyperglycemia   . End-stage renal disease on hemodialysis ([place])   . Non-traumatic rhabdomyolysis   . OSA on CPAP   . Obesity hypoventilation syndrome ([place])   . Acute encephalopathy   . Acute renal failure ([place])   . Diabetic ketoacidosis with coma associated with type 2 diabetes mellitus ([place])   . DKA (diabetic ketoacidoses) ([place]) 08/05/2015  . Atypical chest pain 08/26/2013    Past Surgical History:  Procedure Laterality Date  . APPENDECTOMY         Home Medications    Prior to Admission medications   Medication Sig Start Date End Date Taking? Authorizing Provider  amLODipine (NORVASC) 10 MG tablet Take 5 mg by mouth daily.   Yes [provider]  ARIPiprazole (ABILIFY) 20 MG tablet Take 20 mg by mouth daily.   Yes [provider]  atorvastatin (LIPITOR) 20 MG tablet Take 20 mg by mouth daily.   Yes [provider]  baclofen (LIORESAL) 10 MG tablet Take 10 mg by  mouth 3 (three) times daily as needed for muscle spasms.   Yes [provider]  carboxymethylcellulose (REFRESH PLUS) 0.5 % SOLN Place 1 drop into both eyes 3 (three) times daily as needed. Dry eyes   Yes [provider]  doxepin (SINEQUAN) 50 MG capsule Take 50 mg by mouth at bedtime.   Yes [provider]  insulin detemir (LEVEMIR) 100 UNIT/ML injection Inject 0.16 mLs (16 Units total) into the skin daily. Patient taking differently: Inject 20-25 Units into the skin 2 (two) times daily. 20 units in the am and 25 units at dinner time 08/21/15  Yes Velvet Bathe, MD  insulin glulisine (APIDRA) 100 UNIT/ML injection Inject 5-12 Units into the skin 3 (three) times daily as needed for high blood sugar.   Yes [provider]  losartan (COZAAR) 50 MG tablet Take 25 mg by mouth daily.   Yes [provider]  metFORMIN (GLUCOPHAGE) 500 MG tablet Take 2,000 mg by mouth at bedtime.   Yes [provider]  metoprolol tartrate (LOPRESSOR) 50 MG tablet Take 50 mg by mouth 2 (two) times daily.   Yes [provider]  omeprazole (PRILOSEC) 20 MG capsule Take 40 mg by mouth daily.   Yes [provider]  Alcohol Swabs 70 % PADS 1 application by Does not apply route daily. Patient not taking: Reported on 05/04/2017 08/15/15   Dhungel, [place],  MD  amLODipine (NORVASC) 5 MG tablet Take 1 tablet (5 mg total) by mouth daily. 08/21/15   Velvet Bathe, MD  blood glucose meter kit and supplies Dispense based on patient and insurance preference. Use up to four times daily as directed. (FOR ICD-9 250.00, 250.01). 08/21/15   Velvet Bathe, MD  glucose blood (FREESTYLE LITE) test strip Use as instructed 08/15/15   Dhungel, Nishant, MD  insulin starter kit- pen needles MISC 1 kit by Other route once. 08/21/15   Velvet Bathe, MD  INSULIN SYRINGE .5CC/28G 28G X 1/2" 0.5 ML MISC 1 application by Does not apply route daily. 08/15/15   Louellen Molder, MD    Family  History History reviewed. No pertinent family history.  Social History Social History  Substance Use Topics  . Smoking status: Never Smoker  . Smokeless tobacco: Never Used  . Alcohol use No     Allergies   Patient has no known allergies.   Review of Systems Review of Systems  A complete review of systems was obtained and all systems are negative except as noted in the HPI and PMH.   Physical Exam Updated Vital Signs BP 131/64   Pulse 90   Temp 97.9 F (36.6 C) (Oral)   Resp 16   Ht '5\' 9"'$  (1.753 m)   Wt 136.1 kg (300 lb)   SpO2 98%   BMI 44.30 kg/m   Physical Exam  Constitutional: He is oriented to person, place, and time. He appears well-developed and well-nourished. No distress.  HENT:  Head: Normocephalic and atraumatic.  Mouth/Throat: Oropharynx is clear and moist.  Eyes: Pupils are equal, round, and reactive to light. Conjunctivae and EOM are normal.  Neck: Normal range of motion.  Cardiovascular: Normal rate, regular rhythm and intact distal pulses.   Pulmonary/Chest: Effort normal and breath sounds normal. No respiratory distress. He has no wheezes. He has no rales. He exhibits no tenderness.  Abdominal: Soft. He exhibits no distension and no mass. There is no tenderness. There is no rebound and no guarding. No hernia.  Musculoskeletal: Normal range of motion.  Neurological: He is alert and oriented to person, place, and time.  Skin: He is not diaphoretic.  Psychiatric: He has a normal mood and affect.  Nursing note and vitals reviewed.    ED Treatments / Results  Labs (all labs ordered are listed, but only abnormal results are displayed) Labs Reviewed  BASIC METABOLIC PANEL - Abnormal; Notable for the following:       Result Value   Glucose, Bld 283 (*)    Creatinine, Ser 1.26 (*)    Calcium 8.6 (*)    GFR calc non Af Amer 59 (*)    All other components within normal limits  CBC - Abnormal; Notable for the following:    Hemoglobin 12.9 (*)     All other components within normal limits  URINALYSIS, ROUTINE W REFLEX MICROSCOPIC - Abnormal; Notable for the following:    Glucose, UA >=500 (*)    Squamous Epithelial / LPF 0-5 (*)    All other components within normal limits  CBG MONITORING, ED - Abnormal; Notable for the following:    Glucose-Capillary 268 (*)    All other components within normal limits  CBG MONITORING, ED    EKG  EKG Interpretation None       Radiology No results found.  Procedures Procedures (including critical care time)  Medications Ordered in ED Medications - No data to display   Initial Impression /  Assessment and Plan / ED Course  I have reviewed the triage vital signs and the nursing notes.  Pertinent labs & imaging results that were available during my care of the patient were reviewed by me and considered in my medical decision making (see chart for details).     Vitals:   05/04/17 1919 05/04/17 1926 05/04/17 2223 05/05/17 0012  BP: (!) 157/83  (!) 163/90 131/64  Pulse: (!) 108  97 90  Resp: '18  16 16  '$ Temp: 97.9 F (36.6 C)     TempSrc: Oral     SpO2: 94%  99% 98%  Weight:  136.1 kg (300 lb)    Height:  '5\' 9"'$  (1.753 m)      Micheal Gomez is 61 y.o. male presenting with hyperglycemia in the mid 400s, patient nontoxic-appearing, no other complaints, abdominal exam is benign.  Patient with normal anion gap, will check urinalysis for ketones as he was initially tachycardic.   UA with no ketones, patient with no complaints, will follow closely with primary care.  Evaluation does not show pathology that would require ongoing emergent intervention or inpatient treatment. Pt is hemodynamically stable and mentating appropriately. Discussed findings and plan with patient/guardian, who agrees with care plan. All questions answered. Return precautions discussed and outpatient follow up given.    Final Clinical Impressions(s) / ED Diagnoses   Final diagnoses:  Hyperglycemia    New  Prescriptions Discharge Medication List as of 05/05/2017 12:14 AM       Ashkan Chamberland, Elmyra Ricks, PA-C 05/05/17 7096    Sherwood Gambler, MD 05/05/17 1645

## 2017-05-04 NOTE — ED Notes (Signed)
Pt unable to provide urine specimen.  

## 2017-05-05 NOTE — Discharge Instructions (Signed)
Please follow with your primary care doctor in the next 2 days for a check-up. They must obtain records for further management.  ° °Do not hesitate to return to the Emergency Department for any new, worsening or concerning symptoms.  ° °

## 2020-05-16 ENCOUNTER — Encounter: Payer: Self-pay | Admitting: Gastroenterology

## 2020-07-05 ENCOUNTER — Other Ambulatory Visit (INDEPENDENT_AMBULATORY_CARE_PROVIDER_SITE_OTHER): Payer: No Typology Code available for payment source

## 2020-07-05 ENCOUNTER — Encounter: Payer: Self-pay | Admitting: Gastroenterology

## 2020-07-05 ENCOUNTER — Ambulatory Visit (INDEPENDENT_AMBULATORY_CARE_PROVIDER_SITE_OTHER): Payer: No Typology Code available for payment source | Admitting: Gastroenterology

## 2020-07-05 VITALS — BP 128/82 | HR 102 | Ht 69.0 in | Wt 264.2 lb

## 2020-07-05 DIAGNOSIS — K5900 Constipation, unspecified: Secondary | ICD-10-CM

## 2020-07-05 LAB — COMPREHENSIVE METABOLIC PANEL
ALT: 22 U/L (ref 0–53)
AST: 17 U/L (ref 0–37)
Albumin: 4.2 g/dL (ref 3.5–5.2)
Alkaline Phosphatase: 148 U/L — ABNORMAL HIGH (ref 39–117)
BUN: 16 mg/dL (ref 6–23)
CO2: 22 mEq/L (ref 19–32)
Calcium: 9.2 mg/dL (ref 8.4–10.5)
Chloride: 103 mEq/L (ref 96–112)
Creatinine, Ser: 1.21 mg/dL (ref 0.40–1.50)
GFR: 62.84 mL/min (ref >60.00)
Glucose, Bld: 120 mg/dL — ABNORMAL HIGH (ref 70–99)
Potassium: 3.8 mEq/L (ref 3.5–5.1)
Sodium: 134 mEq/L — ABNORMAL LOW (ref 135–145)
Total Bilirubin: 0.5 mg/dL (ref 0.2–1.2)
Total Protein: 7.9 g/dL (ref 6.0–8.3)

## 2020-07-05 LAB — CBC
HCT: 44.8 % (ref 39.0–52.0)
Hemoglobin: 14.6 g/dL (ref 13.0–17.0)
MCHC: 32.6 g/dL (ref 30.0–36.0)
MCV: 80.9 fl (ref 78.0–100.0)
Platelets: 187 10*3/uL (ref 150.0–400.0)
RBC: 5.54 Mil/uL (ref 4.22–5.81)
RDW: 16.2 % — ABNORMAL HIGH (ref 11.5–15.5)
WBC: 5.8 10*3/uL (ref 4.0–10.5)

## 2020-07-05 LAB — TSH: TSH: 2.2 u[IU]/mL (ref 0.35–4.50)

## 2020-07-05 NOTE — Patient Instructions (Addendum)
If you are age 66 or older, your body mass index should be between 23-30. Your Body mass index is 39.02 kg/m. If this is out of the aforementioned range listed, please consider follow up with your Primary Care Provider.  Your provider has requested that you go to the basement level for lab work before leaving today. Press "B" on the elevator. The lab is located at the first door on the left as you exit the elevator.  You have been scheduled for a colonoscopy. Please follow written instructions given to you at your visit today.  Please pick up your prep supplies at the pharmacy within the next 1-3 days. If you use inhalers (even only as needed), please bring them with you on the day of your procedure.  Due to recent changes in healthcare laws, you may see the results of your imaging and laboratory studies on MyChart before your provider has had a chance to review them.  We understand that in some cases there may be results that are confusing or concerning to you. Not all laboratory results come back in the same time frame and the provider may be waiting for multiple results in order to interpret others.  Please give [place] 48 hours in order for your provider to thoroughly review all the results before contacting the office for clarification of your results.   Please start taking citrucel (orange flavored) powder fiber supplement.  This may cause some bloating at first but that usually goes away. Begin with a small spoonful and work your way up to a large, heaping spoonful daily over a week.  Thank you for entrusting me with your care and choosing [facility].  Dr Christella Hartigan

## 2020-07-05 NOTE — Progress Notes (Signed)
HPI: This is a very pleasant 65 year old man who was referred to me by [facility] system, I am unable to find out who exactly referred to him despite extensive review of a 64 page packet which they sent  Chief complaint chronic constipation   He has had constipation for at least 9 years.  He tells me that he will move his bowels once or twice a month usually.  He never sees blood in his stool.  When he finally does move his bowels they are generally quite loose.  He does not get significant abdominal pains.  He has never had any testing for this.  He has never had a colonoscopy for colon cancer screening.  He tried MiraLAX 1 dose once daily and it did not seem to help.  No definite family history of colon cancer.    Old Data Reviewed:  Blood work from [place] seen through [facility] system September 2021.  CBC showed normal hemoglobin, normal platelets.  Complete metabolic profile was normal except for a creatinine of 182 and an alk phos of 208   Abdominal ultrasound February 2017, indication "transaminitis" findings normal gallbladder, diffuse hepatic steatosis.   I reviewed a 32 page packet from the [place] [facility].  In that there was a request for "diagnostic colonoscopy indications change in bowel habits, other: Chronic constipation none responded to multiple p.o. medicines"  I am not certain but I believe he was referred from a primary care provider at the [place].  Lab results from November 2021 are very limited show a slightly elevated hemoglobin A1c   Review of systems: Pertinent positive and negative review of systems were noted in the above HPI section. All other review negative.   Past Medical History:  Diagnosis Date  . Diabetes mellitus without complication ([place])   . Hypertension     Past Surgical History:  Procedure Laterality Date  . APPENDECTOMY      Current Outpatient Medications  Medication Sig Dispense Refill  . Alcohol Swabs 70 % PADS 1  application by Does not apply route daily. (Patient not taking: Reported on 05/04/2017) 100 each 0  . amLODipine (NORVASC) 10 MG tablet Take 5 mg by mouth daily.    Marland Kitchen amLODipine (NORVASC) 5 MG tablet Take 1 tablet (5 mg total) by mouth daily. 30 tablet 0  . ARIPiprazole (ABILIFY) 20 MG tablet Take 20 mg by mouth daily.    Marland Kitchen atorvastatin (LIPITOR) 20 MG tablet Take 20 mg by mouth daily.    . baclofen (LIORESAL) 10 MG tablet Take 10 mg by mouth 3 (three) times daily as needed for muscle spasms.    . blood glucose meter kit and supplies Dispense based on patient and insurance preference. Use up to four times daily as directed. (FOR ICD-9 250.00, 250.01). 1 each 0  . carboxymethylcellulose (REFRESH PLUS) 0.5 % SOLN Place 1 drop into both eyes 3 (three) times daily as needed. Dry eyes    . doxepin (SINEQUAN) 50 MG capsule Take 50 mg by mouth at bedtime.    Marland Kitchen glucose blood (FREESTYLE LITE) test strip Use as instructed 100 each 12  . insulin detemir (LEVEMIR) 100 UNIT/ML injection Inject 0.16 mLs (16 Units total) into the skin daily. (Patient taking differently: Inject 20-25 Units into the skin 2 (two) times daily. 20 units in the am and 25 units at dinner time) 10 mL 0  . insulin glulisine (APIDRA) 100 UNIT/ML injection Inject 5-12 Units into the skin 3 (three) times  daily as needed for high blood sugar.    . insulin starter kit- pen needles MISC 1 kit by Other route once. 1 each 1  . INSULIN SYRINGE .5CC/28G 28G X 1/2" 0.5 ML MISC 1 application by Does not apply route daily. 100 each 0  . losartan (COZAAR) 50 MG tablet Take 25 mg by mouth daily.    . metFORMIN (GLUCOPHAGE) 500 MG tablet Take 2,000 mg by mouth at bedtime.    . metoprolol tartrate (LOPRESSOR) 50 MG tablet Take 50 mg by mouth 2 (two) times daily.    Marland Kitchen omeprazole (PRILOSEC) 20 MG capsule Take 40 mg by mouth daily.     No current facility-administered medications for this visit.    Allergies as of 07/05/2020  . (No Known Allergies)     History reviewed. No pertinent family history.  Social History   Socioeconomic History  . Marital status: Single    Spouse name: Not on file  . Number of children: Not on file  . Years of education: Not on file  . Highest education level: Not on file  Occupational History  . Not on file  Tobacco Use  . Smoking status: Never Smoker  . Smokeless tobacco: Never Used  Substance and Sexual Activity  . Alcohol use: No  . Drug use: No  . Sexual activity: Not on file  Other Topics Concern  . Not on file  Social History Narrative  . Not on file   Social Determinants of Health   Financial Resource Strain: Not on file  Food Insecurity: Not on file  Transportation Needs: Not on file  Physical Activity: Not on file  Stress: Not on file  Social Connections: Not on file  Intimate Partner Violence: Not on file     Physical Exam: BP 128/82   Pulse (!) 102   Ht _0  (1.753 m)   Wt 264 lb 3.2 oz (119.8 kg)   BMI 39.02 kg/m  Constitutional: Obese, otherwise well-appearing Psychiatric: alert and oriented x3 Eyes: extraocular movements intact Mouth: oral pharynx moist, no lesions Neck: supple no lymphadenopathy Cardiovascular: heart regular rate and rhythm Lungs: clear to auscultation bilaterally Abdomen: soft, nontender, nondistended, no obvious ascites, no peritoneal signs, normal bowel sounds Extremities: no lower extremity edema bilaterally Skin: no lesions on visible extremities   Assessment and plan: 65 y.o. male with chronic constipation  I recommended that we do some testing for this.  He has a bowel movement every week or 2 at most and this has been his pattern for many years.  He has no associated abdominal pains bleeding.  He does not have a family history of colon cancer.  He will start with some blood tests including a CBC, complete metabolic profile, thyroid testing.  I also recommended a colonoscopy at his soonest convenience to exclude significant neoplasm  which I think is very unlikely given how chronic his situation has been.  For now he will stop taking the MiraLAX and instead he will start taking fiber supplements Citrucel on a daily basis.  Please see the "Patient Instructions" section for addition details about the plan.   Owens Loffler, MD [facility] 07/05/2020, 9:00 AM  Cc: [facility], I am unable to find out who exactly referred him here based on review of a 27 page packet that they sent  Total time on date of encounter was 45  minutes (this included time spent preparing to see the patient reviewing records; obtaining and/or reviewing separately obtained history; performing  a medically appropriate exam and/or evaluation; counseling and educating the patient and family if present; ordering medications, tests or procedures if applicable; and documenting clinical information in the health record).

## 2020-07-07 MED ORDER — PEG 3350-KCL-NA BICARB-NACL 420 G PO SOLR: 4000.0000 mL | Freq: Once | ORAL | 0 refills | Status: AC

## 2020-07-07 NOTE — Addendum Note (Signed)
Addended by: Lamona Curl on: 07/07/2020 03:52 PM   Modules accepted: Orders

## 2020-08-01 ENCOUNTER — Encounter: Payer: Self-pay | Admitting: Gastroenterology

## 2020-08-01 ENCOUNTER — Ambulatory Visit (AMBULATORY_SURGERY_CENTER): Payer: No Typology Code available for payment source | Admitting: Gastroenterology

## 2020-08-01 ENCOUNTER — Other Ambulatory Visit: Payer: Self-pay

## 2020-08-01 VITALS — BP 129/67 | HR 84 | Temp 97.5°F | Resp 11 | Ht 69.0 in | Wt 264.0 lb

## 2020-08-01 DIAGNOSIS — D122 Benign neoplasm of ascending colon: Secondary | ICD-10-CM

## 2020-08-01 DIAGNOSIS — K5900 Constipation, unspecified: Secondary | ICD-10-CM | POA: Diagnosis not present

## 2020-08-01 DIAGNOSIS — D123 Benign neoplasm of transverse colon: Secondary | ICD-10-CM

## 2020-08-01 MED ORDER — SODIUM CHLORIDE 0.9 % IV SOLN: 500.0000 mL | Freq: Once | INTRAVENOUS | Status: DC

## 2020-08-01 NOTE — Progress Notes (Signed)
Pt's states no medical or surgical changes since previsit or office visit.  ° °Cw vitals  °

## 2020-08-01 NOTE — Patient Instructions (Signed)

## 2020-08-01 NOTE — Progress Notes (Signed)
To pacu, VSS. Report to [email or web address] 

## 2020-08-01 NOTE — Op Note (Signed)
[facility] Patient Name: Micheal Gomez Procedure Date: 08/01/2020 2:59 PM MRN: [id number] Endoscopist: [name] , MD Age: 65 Referring MD:  Date of Birth: Feb 19, 1956 Gender: Male Account #: [id number] Procedure:                Colonoscopy Indications:              Constipation Medicines:                Monitored Anesthesia Care Procedure:                Pre-Anesthesia Assessment:                           - Prior to the procedure, a History and Physical                            was performed, and patient medications and                            allergies were reviewed. The patient's tolerance of                            previous anesthesia was also reviewed. The risks                            and benefits of the procedure and the sedation                            options and risks were discussed with the patient.                            All questions were answered, and informed consent                            was obtained. Prior Anticoagulants: The patient has                            taken no previous anticoagulant or antiplatelet                            agents. ASA Grade Assessment: II - A patient with                            mild systemic disease. After reviewing the risks                            and benefits, the patient was deemed in                            satisfactory condition to undergo the procedure.                           After obtaining informed consent, the colonoscope  was passed under direct vision. Throughout the                            procedure, the patient's blood pressure, pulse, and                            oxygen saturations were monitored continuously. The                            Olympus CF-HQ190 [id number]) Colonoscope was                            introduced through the anus and advanced to the the                            cecum, identified by appendiceal orifice and                             ileocecal valve. The colonoscopy was performed                            without difficulty. The patient tolerated the                            procedure well. The quality of the bowel                            preparation was good. The ileocecal valve,                            appendiceal orifice, and rectum were photographed. Scope In: 3:07:39 PM Scope Out: 3:30:58 PM Scope Withdrawal Time: 0 hours 20 minutes 40 seconds  Total Procedure Duration: 0 hours 23 minutes 19 seconds  Findings:                 Two sessile polyps were found in the distal                            transverse colon and ascending colon. The polyps                            were 3 to 6 mm in size. These polyps were removed                            with a cold snare. Resection and retrieval were                            complete.                           A 35 mm polyp was found in the proximal transverse                            colon spanning two sides of a fold.  The polyp                            heaped up and sessile. Biopsies were taken with a                            cold forceps for histology. Following biopsy the                            site was labeled with two submucosal injections of                            Spot.                           The exam was otherwise without abnormality on                            direct and retroflexion views. Complications:            No immediate complications. Estimated blood loss:                            None. Estimated Blood Loss:     Estimated blood loss: none. Impression:               - Two 3 to 6 mm polyps in the distal transverse                            colon and in the ascending colon, removed with a                            cold snare. Resected and retrieved.                           - One 35 mm polyp in the proximal transverse colon.                            Biopsied. Following biopsy the site was  labeled                            with two submucosal injections of Spot.                           - The examination was otherwise normal on direct                            and retroflexion views. Recommendation:           - Patient has a contact number available for                            emergencies. The signs and symptoms of potential                            delayed complications  were discussed with the                            patient. Return to normal activities tomorrow.                            Written discharge instructions were provided to the                            patient.                           - Resume previous diet.                           - Continue present medications.                           - Await pathology results. [name], MD 08/01/2020 3:37:41 PM This report has been signed electronically.

## 2020-08-01 NOTE — Progress Notes (Signed)
Called to room to assist during endoscopic procedure.  Patient ID and intended procedure confirmed with present staff. Received instructions for my participation in the procedure from the performing physician.  

## 2020-08-03 ENCOUNTER — Telehealth: Payer: Self-pay | Admitting: *Deleted

## 2020-08-03 NOTE — Telephone Encounter (Signed)
  Follow up Call-  Call back number 08/01/2020  Post procedure Call Back phone  # [phone]  Permission to leave phone message Yes  Some recent data might be hidden     Patient questions:  Do you have a fever, pain , or abdominal swelling? No. Pain Score  0 *  Have you tolerated food without any problems? Yes.    Have you been able to return to your normal activities? Yes.    Do you have any questions about your discharge instructions: Diet   No. Medications  No. Follow up visit  No.  Do you have questions or concerns about your Care? No.  Actions: * If pain score is 4 or above: No action needed, pain <4.  1. Have you developed a fever since your procedure? no  2.   Have you had an respiratory symptoms (SOB or cough) since your procedure? no  3.   Have you tested positive for COVID 19 since your procedure no  4.   Have you had any family members/close contacts diagnosed with the COVID 19 since your procedure?  no   If yes to any of these questions please route to Joylene John, RN and Joella Prince, RN

## 2020-08-16 ENCOUNTER — Telehealth: Payer: Self-pay | Admitting: Gastroenterology

## 2020-08-16 NOTE — Telephone Encounter (Signed)
Pt is checking on the status of referral to [facility]

## 2020-08-16 NOTE — Telephone Encounter (Signed)
The pt has been advised that [facility] is working on the referral .   Have given the pt the phone number to [facility] to call at his convenience.

## 2020-09-16 ENCOUNTER — Telehealth: Payer: Self-pay | Admitting: Gastroenterology

## 2020-09-16 NOTE — Telephone Encounter (Signed)
Doc of the Day  PMD called to discuss colonoscopy findings and path report.  She is wondering if patient needs to have surgery or if the polyp can be removed through colonoscopic resection.  I told her that I will inform Dr [name] and have him get back with his recommendations.   Damaris Hippo , MD [phone]

## 2020-09-19 NOTE — Telephone Encounter (Signed)
V.A. patient.  I have no idea who his PMD is or how to reach them.  Can you help with this, looks like PMD had a question about his large colon polyp. The patient was offered colonoscopies but wanted surgery instead which is not unreasonable since it would take 2-3 (or more) colonoscopies in the next 1-2 years to ensure the polyp is gone.

## 2020-09-19 NOTE — Telephone Encounter (Signed)
I spoke with his primary care, Dr.Campos, she understands that he has a fairly large polyp in his colon.  Biopsies showed no sign of cancer however at this size it is sometimes difficult to know if this is a false negative.  I told her that I offered him colonoscopy resection and explained to him that it would require at least one if not 2 or possibly 3 colonoscopies over the next year or 2 to make sure any neoplasia is gone.  He preferred surgery and so we referred him appropriately.  She was happy to discuss this.

## 2020-09-19 NOTE — Telephone Encounter (Signed)
The pt states the PCP is Dr Venora Maples at [phone]

## 2020-10-14 ENCOUNTER — Other Ambulatory Visit: Payer: Self-pay

## 2020-10-14 ENCOUNTER — Encounter (HOSPITAL_COMMUNITY): Payer: Self-pay

## 2020-10-14 ENCOUNTER — Emergency Department (HOSPITAL_COMMUNITY): Payer: No Typology Code available for payment source

## 2020-10-14 DIAGNOSIS — E119 Type 2 diabetes mellitus without complications: Secondary | ICD-10-CM | POA: Diagnosis not present

## 2020-10-14 DIAGNOSIS — R079 Chest pain, unspecified: Secondary | ICD-10-CM | POA: Diagnosis present

## 2020-10-14 DIAGNOSIS — I10 Essential (primary) hypertension: Secondary | ICD-10-CM | POA: Diagnosis not present

## 2020-10-14 LAB — CBC
HCT: 46.5 % (ref 39.0–52.0)
Hemoglobin: 14.9 g/dL (ref 13.0–17.0)
MCH: 26.1 pg (ref 26.0–34.0)
MCHC: 32 g/dL (ref 30.0–36.0)
MCV: 81.4 fL (ref 80.0–100.0)
Platelets: 194 10*3/uL (ref 150–400)
RBC: 5.71 MIL/uL (ref 4.22–5.81)
RDW: 15.6 % — ABNORMAL HIGH (ref 11.5–15.5)
WBC: 8 10*3/uL (ref 4.0–10.5)
nRBC: 0 % (ref 0.0–0.2)

## 2020-10-14 LAB — BASIC METABOLIC PANEL
Anion gap: 8 (ref 5–15)
BUN: 21 mg/dL (ref 8–23)
CO2: 22 mmol/L (ref 22–32)
Calcium: 9.2 mg/dL (ref 8.9–10.3)
Chloride: 107 mmol/L (ref 98–111)
Creatinine, Ser: 1.23 mg/dL (ref 0.61–1.24)
GFR, Estimated: >60 mL/min (ref >60)
Glucose, Bld: 118 mg/dL — ABNORMAL HIGH (ref 70–99)
Potassium: 4.2 mmol/L (ref 3.5–5.1)
Sodium: 137 mmol/L (ref 135–145)

## 2020-10-14 LAB — TROPONIN I (HIGH SENSITIVITY): Troponin I (High Sensitivity): 8 ng/L (ref <18)

## 2020-10-14 IMAGING — CR DG CHEST 2V
2 series · 2 of 2 positions shown · non-contrast
Comparison: [DATE]

CLINICAL DATA: Intermittent left chest pain starting last week

EXAM:
CHEST - 2 VIEW

[w chest pa]
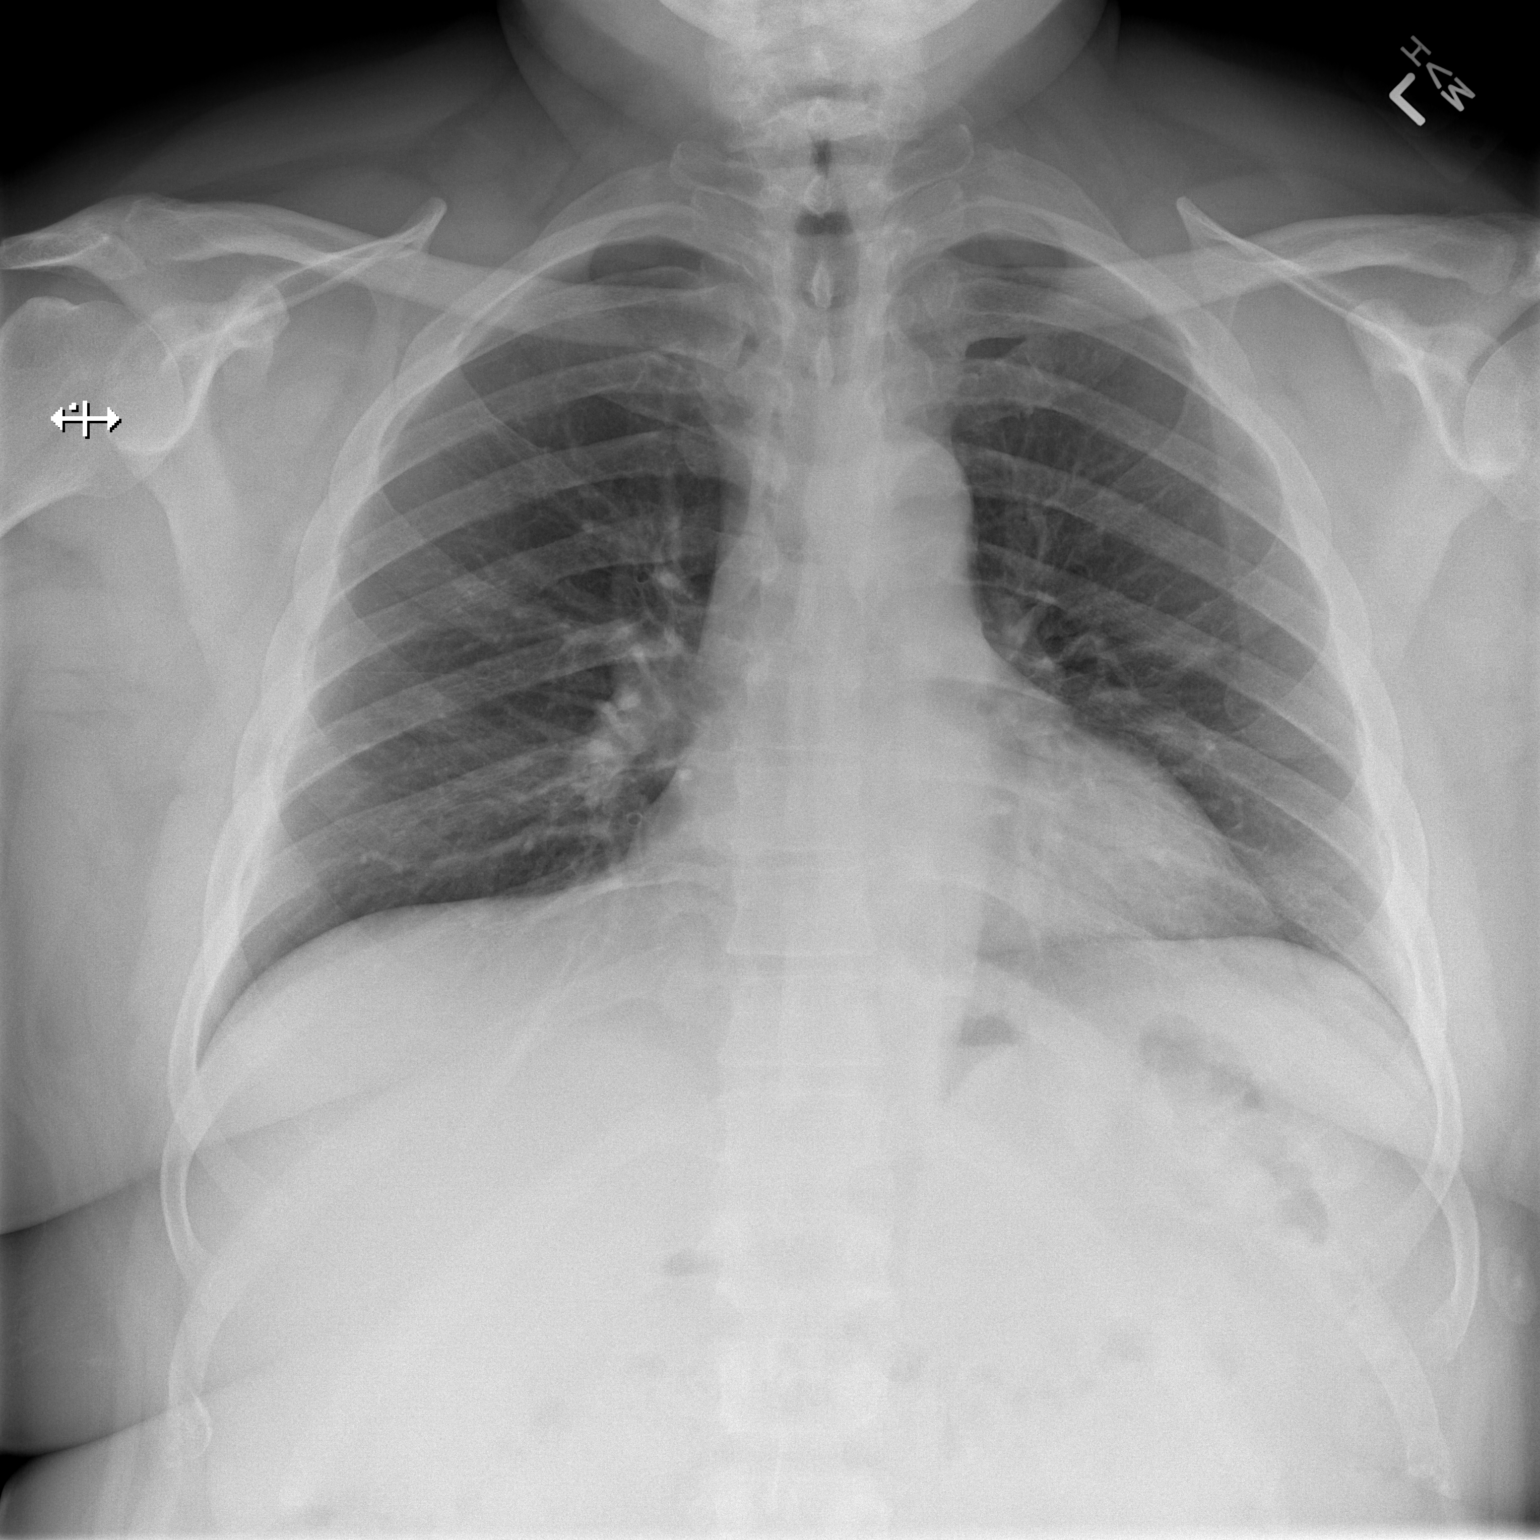

[w chest lat]
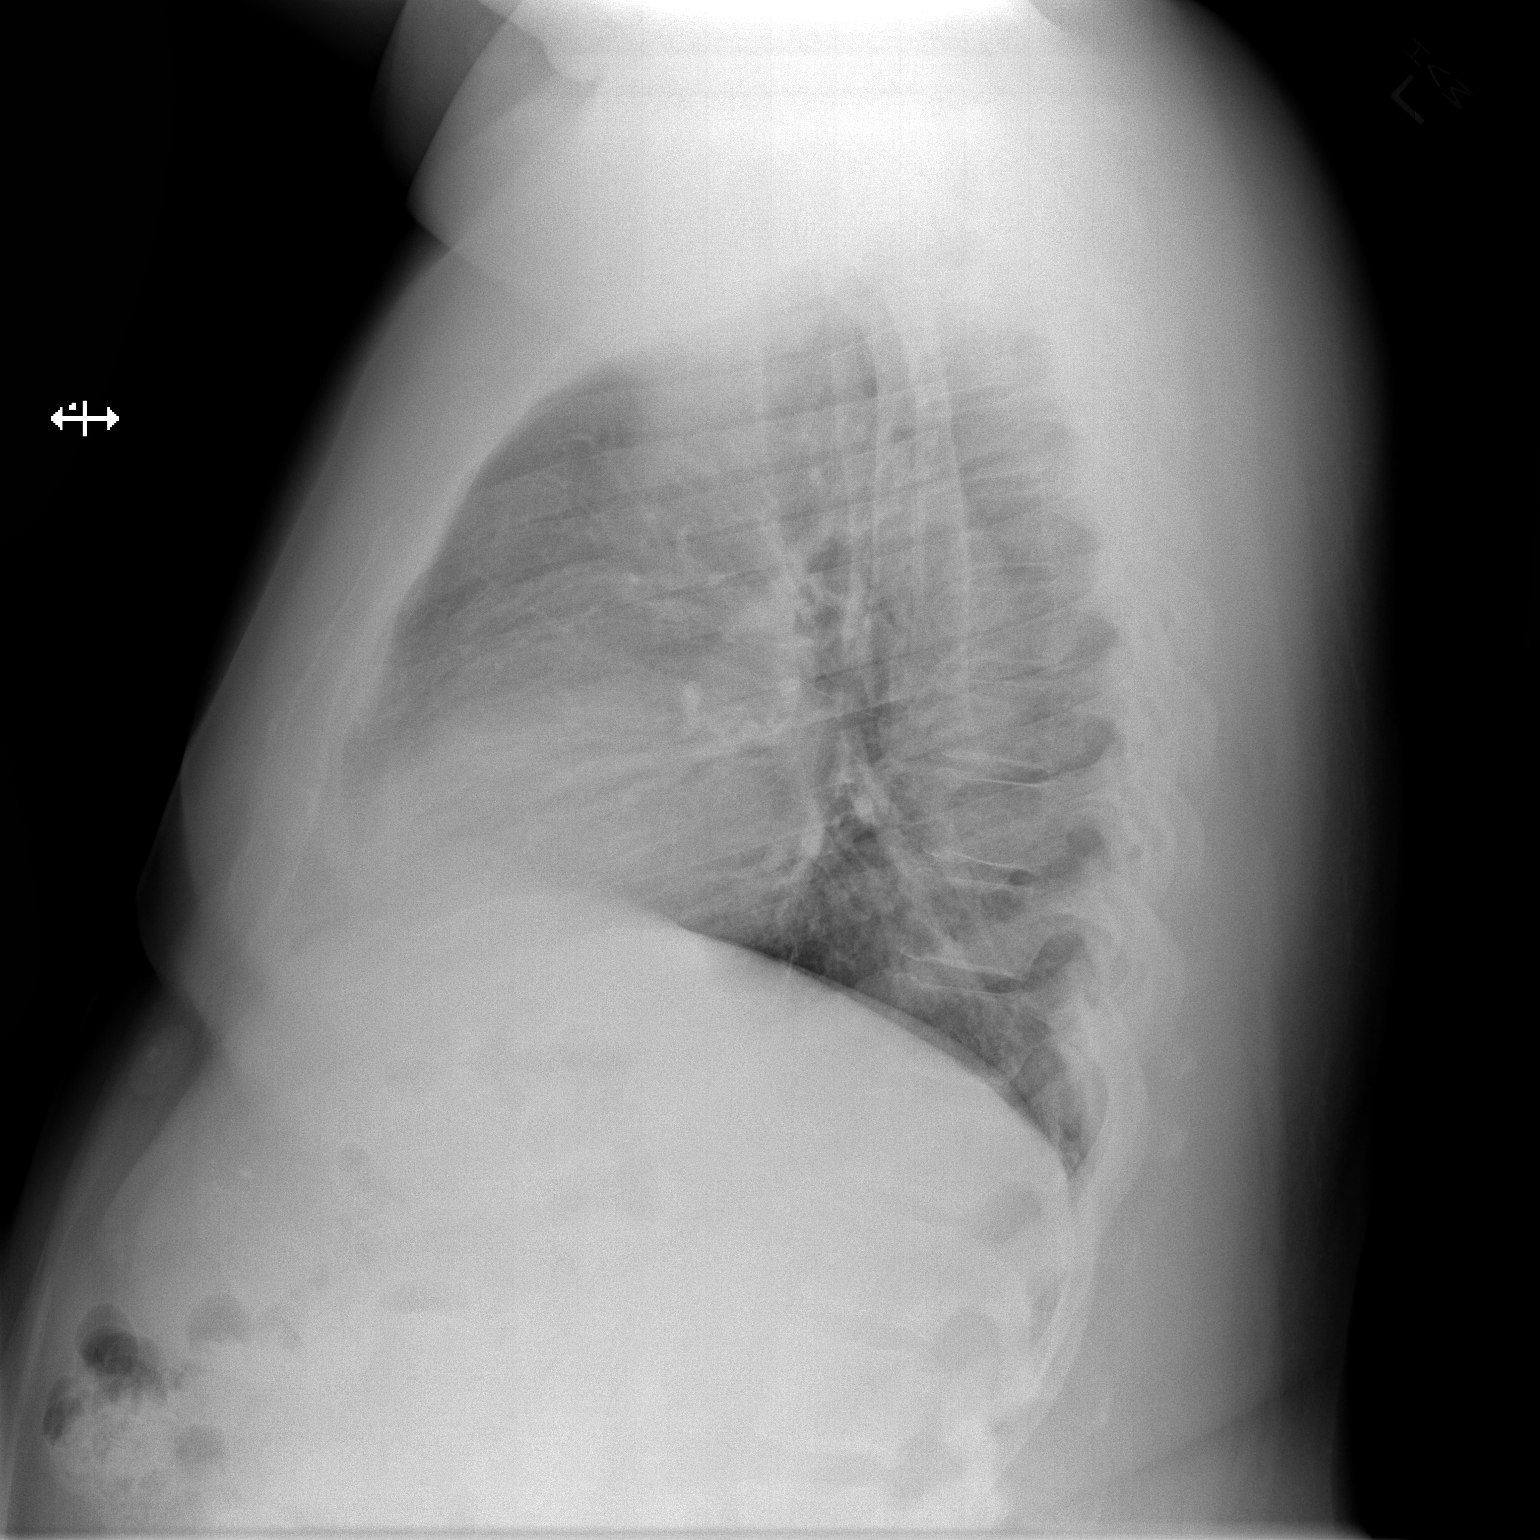

[2 of 2 positions shown; findings below may reference images not displayed]

FINDINGS: Mildly low lung volumes. The lungs appear clear. Cardiac and
mediastinal contours normal. No pleural effusion identified. No bony
abnormality is identified.
IMPRESSION: No active cardiopulmonary disease is radiographically apparent.

## 2020-10-14 NOTE — ED Notes (Signed)
Two Gold top tubes sent to lab with blood work

## 2020-10-14 NOTE — ED Triage Notes (Signed)
Patient c/o intermittent left chest pain since last week. Patient denies any SOB or other symptoms.

## 2020-10-14 NOTE — ED Triage Notes (Signed)
Emergency Medicine Provider Triage Evaluation Note  Micheal Gomez , a 65 y.o. male  was evaluated in triage.  Pt complains of left-sided chest pain that has been intermittent since last week.  No specific trigger.  No shortness of breath, cough or leg swelling.  This got worse today.  Review of Systems  Positive: Chest pain Negative: Cough, shortness of breath, leg swelling  Physical Exam  BP (!) 181/87 (BP Location: Left Arm)   Pulse (!) 102   Temp 98.1 F (36.7 C) (Oral)   Resp (!) 26   Ht 5\' 9"  (1.753 m)   Wt 116.1 kg   SpO2 99%   BMI 37.80 kg/m  Gen:   Awake, no distress   HEENT:  Atraumatic  Resp:  Normal effort  Cardiac:  Normal rate  Abd:   Nondistended, nontender  MSK:   Moves extremities without difficulty  Neuro:  Speech clear   Medical Decision Making  Medically screening exam initiated at 6:16 PM.  Appropriate orders placed.  Micheal Gomez was informed that the remainder of the evaluation will be completed by another provider, this initial triage assessment does not replace that evaluation, and the importance of remaining in the ED until their evaluation is complete.  Clinical Impression  65 year old male presenting to the ED for intermittent chest pain for the past week.  Will order EKG, troponin and basic labs   Micheal Heady, PA-C 10/14/20 1816

## 2020-10-15 ENCOUNTER — Emergency Department (HOSPITAL_COMMUNITY)
Admission: EM | Admit: 2020-10-15 | Discharge: 2020-10-15 | Disposition: A | Discharge status: Home / Self Care | Payer: No Typology Code available for payment source | Attending: Emergency Medicine | Admitting: Emergency Medicine

## 2020-10-15 DIAGNOSIS — R079 Chest pain, unspecified: Secondary | ICD-10-CM

## 2020-10-15 HISTORY — DX: Personal history of peptic ulcer disease: Z87.11

## 2020-10-15 HISTORY — DX: Pure hypercholesterolemia, unspecified: E78.00

## 2020-10-15 LAB — TROPONIN I (HIGH SENSITIVITY): Troponin I (High Sensitivity): 10 ng/L (ref <18)

## 2020-10-15 NOTE — Discharge Instructions (Addendum)
You were evaluated in the Emergency Department and after careful evaluation, we did not find any emergent condition requiring admission or further testing in the hospital.  Your exam/testing today was overall reassuring.  Testing did not show any signs of heart damage.  We recommend follow-up with your primary care doctor to discuss need for outpatient stress testing.  Please return to the Emergency Department if you experience any worsening of your condition.  Thank you for allowing [place] to be a part of your care.

## 2020-10-15 NOTE — ED Notes (Signed)
Discharged no concerns at this time.  °

## 2020-10-15 NOTE — ED Provider Notes (Signed)
[facility] Emergency Department Provider Note MRN:  [id number]  Arrival date & time: 10/15/20     Chief Complaint   Chest Pain   History of Present Illness   Micheal Gomez is a 65 y.o. year-old male with a history of hypertension, diabetes presenting to the ED with chief complaint of chest pain.  Intermittent chest pain over the past week.  Left-sided, described as a pushing sensation.  Worse when laying on the left side at night.  Not exertional.  Somewhat tender when palpated.  Denies dizziness or diaphoresis, no nausea vomiting, no trouble breathing.  No leg pain or swelling.  No abdominal pain.  Mild in severity.  Review of Systems  A complete 10 system review of systems was obtained and all systems are negative except as noted in the HPI and PMH.   Patient's Health History    Past Medical History:  Diagnosis Date  . Diabetes mellitus without complication ([place])   . High cholesterol   . History of stomach ulcers   . Hypertension     Past Surgical History:  Procedure Laterality Date  . APPENDECTOMY    . polyp removal    . TONSILLECTOMY      Family History  Problem Relation Age of Onset  . Diabetes Father   . Hypertension Father   . Colon cancer Neg Hx   . Stomach cancer Neg Hx   . Rectal cancer Neg Hx   . Esophageal cancer Neg Hx     Social History   Socioeconomic History  . Marital status: Single    Spouse name: Not on file  . Number of children: Not on file  . Years of education: Not on file  . Highest education level: Not on file  Occupational History  . Not on file  Tobacco Use  . Smoking status: Never Smoker  . Smokeless tobacco: Never Used  Vaping Use  . Vaping Use: Never used  Substance and Sexual Activity  . Alcohol use: No  . Drug use: No  . Sexual activity: Not on file  Other Topics Concern  . Not on file  Social History Narrative  . Not on file   Social Determinants of Health   Financial Resource Strain:  Not on file  Food Insecurity: Not on file  Transportation Needs: Not on file  Physical Activity: Not on file  Stress: Not on file  Social Connections: Not on file  Intimate Partner Violence: Not on file     Physical Exam   Vitals:   10/15/20 0303 10/15/20 0304  BP: 121/76 121/76  Pulse: 80 69  Resp: 18 19  Temp:    SpO2: 99% 95%    CONSTITUTIONAL: Well-appearing, NAD NEURO:  Alert and oriented x 3, no focal deficits EYES:  eyes equal and reactive ENT/NECK:  no LAD, no JVD CARDIO: Regular rate, well-perfused, normal S1 and S2 PULM:  CTAB no wheezing or rhonchi GI/GU:  normal bowel sounds, non-distended, non-tender MSK/SPINE:  No gross deformities, no edema SKIN:  no rash, atraumatic PSYCH:  Appropriate speech and behavior  *Additional and/or pertinent findings included in MDM below  Diagnostic and Interventional Summary    EKG Interpretation  Date/Time:  October 14, 2020 at 6 PM Ventricular Rate:  98 PR Interval:  183 QRS Duration: 81 QT Interval:  344 QTC Calculation: 440 R Axis:     Text Interpretation: Sinus rhythm, normal intervals, no ischemic findings.      Labs Reviewed  BASIC METABOLIC  PANEL - Abnormal; Notable for the following components:      Result Value   Glucose, Bld 118 (*)    All other components within normal limits  CBC - Abnormal; Notable for the following components:   RDW 15.6 (*)    All other components within normal limits  TROPONIN I (HIGH SENSITIVITY)  TROPONIN I (HIGH SENSITIVITY)    DG Chest 2 View  Final Result      Medications - No data to display   Procedures  /  Critical Care Procedures  ED Course and Medical Decision Making  I have reviewed the triage vital signs, the nursing notes, and pertinent available records from the EMR.  Listed above are laboratory and imaging tests that I personally ordered, reviewed, and interpreted and then considered in my medical decision making (see below for details).  Chest pain,  atypical, based on history seems most likely to be musculoskeletal.  Patient has a history of hypertension, diabetes, obesity, no known history of CAD, has never had stress test or catheterization.  Will obtain troponin x2.  EKG is without any ischemic findings.  With 2 negative troponins patient would be appropriate for discharge and close outpatient follow-up with PCP to discuss stress testing.       Barth Kirks. Sedonia Small, [place] [email or web address]  Final Clinical Impressions(s) / ED Diagnoses     ICD-10-CM   1. Chest pain, unspecified type  R07.9     ED Discharge Orders    None       Discharge Instructions Discussed with and Provided to Patient:     Discharge Instructions     You were evaluated in the Emergency Department and after careful evaluation, we did not find any emergent condition requiring admission or further testing in the hospital.  Your exam/testing today was overall reassuring.  Testing did not show any signs of heart damage.  We recommend follow-up with your primary care doctor to discuss need for outpatient stress testing.  Please return to the Emergency Department if you experience any worsening of your condition.  Thank you for allowing [place] to be a part of your care.        Maudie Flakes, MD 10/15/20 [phone]

## 2020-10-24 ENCOUNTER — Encounter: Payer: Self-pay | Admitting: Surgery

## 2020-10-24 ENCOUNTER — Ambulatory Visit: Payer: Self-pay | Admitting: Surgery

## 2020-10-24 NOTE — H&P (Signed)
Micheal Gomez Appointment: 10/24/2020 4:00 PM Location: [facility] Patient #: [id number] DOB: 09-08-1955 Divorced / Language: Cleophus Molt / Race: Black or African American Male  History of Present Illness Micheal Hector MD; 10/24/2020 4:21 PM) The patient is a 65 year old male who presents with a colonic polyp. Note for "Colonic polyp": ` ` ` Patient sent for surgical consultation at the request of Micheal Gomez  Chief Complaint: Large polyp in transverse colon. Consider surgery. ` ` The patient is a pleasant gentleman. Underwent colonoscopy by Dr. [name] was Micheal Gomez. Normally gets care through the [place] system. Retired from [employer]. This colonoscopy was for screening purposes. Does not know his family history well since his parents died when he was young. Numerous polyps noted. Most polyps removed. However larger polyp noted and transverse colon. He thinks it's proximal transverse colon. Patient sounds like he has a regular bowels favoring constipation. Occasionally loose. Thinks it was mostly triggered by metformin in the past. He is on insulin and oral hypoglycemics. Last A1c 6.9. Does not smoke tobacco. He can walk a half hour without difficulty. He does have sleep apnea but is stable on the CPAP machine. Been taking some MiraLAX and its helps. He had an appendectomy but no other abdominal surgery.  No personal nor family history of Gomez/colon cancer, inflammatory bowel disease, irritable bowel syndrome, allergy such as Celiac Sprue, dietary/dairy problems, colitis, ulcers nor gastritis. No recent sick contacts/gastroenteritis. No travel outside the country. No changes in diet. No dysphagia to solids or liquids. No significant heartburn or reflux. No melena, hematemesis, coffee ground emesis. No evidence of prior gastric/peptic ulceration.  (Review of systems as stated in this history (HPI) or in the review of systems. Otherwise all other 12 point ROS  are negative) ` ` ###########################################`  CC: REPORT OF SURGICAL PATHOLOGY FINAL DIAGNOSIS Diagnosis 1. 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 2. Surgical [P], colon, transverse - TUBULAR ADENOMA, NEGATIVE FOR HIGH GRADE DYSPLASIA. Micheal Manners MD Pathologist, Electronic Signature (Case signed 08/08/2020) Specimen Micheal Gomez and Clinical Information Specimen Comment 1. Constipation, unspecified constipation type, benign neoplasm of ascending and transverse colon Specimen(s) Obtained: 1. Surgical [P], colon, ascending, transverse, polyp (2) 2. Surgical [P], colon, transverse Specimen Clinical Information 1. R/O adenoma 2. Mass vs polyp; R/O adenoma/cancer Micheal Gomez 1. Received in formalin are tan, soft tissue fragments that are submitted in toto. Number: 2, Size: 0.3 cm smallest to 0.5 cm largest, (1 B) ( TA ) 2. Received in formalin are tan, soft tissue fragments that are submitted in toto. Number: M, Size: 0.2 cm smallest to 0.3 cm largest, (1 B) ( TA ) Technical component for this case was performed at 1 of 2 FINAL for Micheal Gomez, [place] (Micheal Gomez)   #################################################################  This patient encounter took 30 minutes today to perform the following: obtain history, perform exam, review outside records, interpret tests & imaging, counsel the patient on their diagnosis; and, document this encounter, including findings & plan in the electronic health record (EHR).   Past Surgical History Illene Gomez, Gomez; 10/24/2020 3:53 PM) Appendectomy Colon Polyp Removal - Colonoscopy  Diagnostic Studies History Micheal Gomez, Gomez; 10/24/2020 3:53 PM) Colonoscopy within last year  Allergies Micheal Gomez, Gomez; 10/24/2020 3:55 PM) No Known Drug Allergies [10/24/2020]:  Medication History Illene Gomez, Gomez; 10/24/2020 4:01 PM) Norvasc  (10MG  Tablet, Oral) Active. Abilify (20MG  Tablet, Oral) Active. Atorvastatin Calcium (20MG  Tablet, Oral) Active. Lioresal (10MG  Tablet, Oral) Active.  Doxepin HCl (50MG  Capsule, Oral) Active. hydroCHLOROthiazide (25MG  Tablet, Oral) Active. Losartan Potassium (100MG  Tablet, Oral) Active. Omeprazole (20MG  Tablet DR, Oral) Active. risperiDONE (3MG  Tablet, Oral) Active. Senna (187MG  Tablet, Oral) Active. Baby Aspirin (81MG  Tablet Chewable, Oral) Active. Medications Reconciled  Social History Illene Gomez, Gomez; 10/24/2020 3:53 PM) Alcohol use Remotely quit alcohol use. Caffeine use Carbonated beverages, Coffee, Tea. No drug use Tobacco use Former smoker.  Family History Illene Gomez, Micheal Gomez; 10/24/2020 3:53 PM) Family history unknown First Degree Relatives  Other Problems Illene Gomez, Gomez; 10/24/2020 3:53 PM) Diabetes Mellitus Hypercholesterolemia Sleep Apnea     Review of Systems (Micheal Gomez Gomez; 10/24/2020 3:53 PM) General Not Present- Appetite Loss, Chills, Fatigue, Fever, Night Sweats, Weight Gain and Weight Loss. Skin Not Present- Change in Wart/Mole, Dryness, Hives, Jaundice, New Lesions, Non-Healing Wounds, Rash and Ulcer. HEENT Not Present- Earache, Hearing Loss, Hoarseness, Nose Bleed, Oral Ulcers, Ringing in the Ears, Seasonal Allergies, Sinus Pain, Sore Throat, Visual Disturbances, Wears glasses/contact lenses and Yellow Eyes. Respiratory Not Present- Bloody sputum, Chronic Cough, Difficulty Breathing, Snoring and Wheezing. Gastrointestinal Not Present- Abdominal Pain, Bloating, Bloody Stool, Change in Bowel Habits, Chronic diarrhea, Constipation, Difficulty Swallowing, Excessive gas, Gets full quickly at meals, Hemorrhoids, Indigestion, Nausea, Rectal Pain and Vomiting. Musculoskeletal Not Present- Back Pain, Joint Pain, Joint Stiffness, Muscle Pain, Muscle Weakness and Swelling of Extremities. Neurological Not Present- Decreased Memory,  Fainting, Headaches, Numbness, Seizures, Tingling, Tremor, Trouble walking and Weakness. Psychiatric Present- Bipolar. Not Present- Anxiety, Change in Sleep Pattern, Depression, Fearful and Frequent crying. Endocrine Present- New Diabetes. Not Present- Cold Intolerance, Excessive Hunger, Hair Changes, Heat Intolerance and Hot flashes. Hematology Not Present- Blood Thinners, Easy Bruising, Excessive bleeding, Gland problems, HIV and Persistent Infections.  Vitals (Micheal Gomez Gomez; 10/24/2020 3:55 PM) 10/24/2020 3:55 PM Weight: 259.8 lb Height: 69in Body Surface Area: 2.31 m Body Mass Index: 38.37 kg/m  Pulse: 148 (Regular)  BP: 132/84(Sitting, Left Arm, Standard)        Physical Exam Micheal Hector MD; 10/24/2020 4:17 PM)  General Mental Status-Alert. General Appearance-Not in acute distress, Not Sickly. Orientation-Oriented X3. Hydration-Well hydrated. Voice-Normal.  Integumentary Global Assessment Upon inspection and palpation of skin surfaces of the - Axillae: non-tender, no inflammation or ulceration, no drainage. and Distribution of scalp and body hair is normal. General Characteristics Temperature - normal warmth is noted.  Head and Neck Head-normocephalic, atraumatic with no lesions or palpable masses. Face Global Assessment - atraumatic, no absence of expression. Neck Global Assessment - no abnormal movements, no bruit auscultated on the right, no bruit auscultated on the left, no decreased range of motion, non-tender. Trachea-midline. Thyroid Gland Characteristics - non-tender.  Eye Eyeball - Left-Extraocular movements intact, No Nystagmus - Left. Eyeball - Right-Extraocular movements intact, No Nystagmus - Right. Cornea - Left-No Hazy - Left. Cornea - Right-No Hazy - Right. Sclera/Conjunctiva - Left-No scleral icterus, No Discharge - Left. Sclera/Conjunctiva - Right-No scleral icterus, No Discharge - Right. Pupil -  Left-Direct reaction to light normal. Pupil - Right-Direct reaction to light normal.  ENMT Ears Pinna - Left - no drainage observed, no generalized tenderness observed. Pinna - Right - no drainage observed, no generalized tenderness observed. Nose and Sinuses External Inspection of the Nose - no destructive lesion observed. Inspection of the nares - Left - quiet respiration. Inspection of the nares - Right - quiet respiration. Mouth and Throat Lips - Upper Lip - no fissures observed, no pallor noted. Lower Lip - no fissures observed, no pallor noted. Nasopharynx -  no discharge present. Oral Cavity/Oropharynx - Tongue - no dryness observed. Oral Mucosa - no cyanosis observed. Hypopharynx - no evidence of airway distress observed.  Chest and Lung Exam Inspection Movements - Normal and Symmetrical. Accessory muscles - No use of accessory muscles in breathing. Palpation Palpation of the chest reveals - Non-tender. Auscultation Breath sounds - Normal and Clear.  Cardiovascular Auscultation Rhythm - Regular. Murmurs & Other Heart Sounds - Auscultation of the heart reveals - No Murmurs and No Systolic Clicks.  Abdomen Inspection Inspection of the abdomen reveals - No Visible peristalsis and No Abnormal pulsations. Umbilicus - No Bleeding, No Urine drainage. Palpation/Percussion Palpation and Percussion of the abdomen reveal - Soft, Non Tender, No Rebound tenderness, No Rigidity (guarding) and No Cutaneous hyperesthesia. Note: Abdomen obese but soft. Nontender. Not distended. Right lower quadrant oblique incision consistent with prior open appendectomy. No umbilical or incisional hernias. No guarding.  Male Genitourinary Sexual Maturity Tanner 5 - Adult hair pattern and Adult penile size and shape.  Peripheral Vascular Upper Extremity Inspection - Left - No Cyanotic nailbeds - Left, Not Ischemic. Inspection - Right - No Cyanotic nailbeds - Right, Not  Ischemic.  Neurologic Neurologic evaluation reveals -normal attention span and ability to concentrate, able to name objects and repeat phrases. Appropriate fund of knowledge , normal sensation and normal coordination. Mental Status Affect - not angry, not paranoid. Cranial Nerves-Normal Bilaterally. Gait-Normal.  Neuropsychiatric Mental status exam performed with findings of-able to articulate well with normal speech/language, rate, volume and coherence, thought content normal with ability to perform basic computations and apply abstract reasoning and no evidence of hallucinations, delusions, obsessions or homicidal/suicidal ideation.  Musculoskeletal Global Assessment Spine, Ribs and Pelvis - no instability, subluxation or laxity. Right Upper Extremity - no instability, subluxation or laxity.  Lymphatic Head & Neck  General Head & Neck Lymphatics: Bilateral - Description - No Localized lymphadenopathy. Axillary  General Axillary Region: Bilateral - Description - No Localized lymphadenopathy. Femoral & Inguinal  Generalized Femoral & Inguinal Lymphatics: Left - Description - No Localized lymphadenopathy. Right - Description - No Localized lymphadenopathy.    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 Best guess is in proximal transverse colon.  Reasonable to consider segmental colonic resection. Hopefully robotic right sided proximal colectomy with intracorporeal anastomosis.  Hopefully can do this in a controlled fashion to minimize risks. He had his fiance are interested in proceeding. Questions answered.   PREOP COLON - ENCOUNTER FOR PREOPERATIVE EXAMINATION FOR GENERAL SURGICAL PROCEDURE (Z01.818)  Current Plans You are being scheduled for surgery- Our schedulers will call you.  You  should hear from our office's scheduling department within 5 working days about the location, date, and time of surgery. We try to make accommodations for patient's preferences in scheduling surgery, but sometimes the OR schedule or the surgeon's schedule prevents [place] from making those accommodations.  If you have not heard from our office [phone]) in 5 working days, call the office and ask for your surgeon's nurse.  If you have other questions about your diagnosis, plan, or surgery, call the office and ask for your surgeon's nurse.  Written instructions provided The anatomy & physiology of the digestive tract was discussed. The pathophysiology of the colon was discussed. Natural history risks without surgery was discussed. I feel the risks of no intervention will lead to serious problems that  outweigh the operative risks; therefore, I recommended a partial colectomy to remove the pathology. Minimally invasive (Robotic/Laparoscopic) & open techniques were discussed.  Risks such as bleeding, infection, abscess, leak, reoperation, possible ostomy, hernia, heart attack, death, and other risks were discussed. I noted a good likelihood this will help address the problem. Goals of post-operative recovery were discussed as well. Need for adequate nutrition, daily bowel regimen and healthy physical activity, to optimize recovery was noted as well. We will work to minimize complications. Educational materials were available as well. Questions were answered. The patient expresses understanding & wishes to proceed with surgery.  Pt Education - [facility] Colon Bowel Prep 2018 ERAS/Miralax/Antibiotics Pt Education - Pamphlet Given - Laparoscopic Colorectal Surgery: discussed with patient and provided information. Pt Education - [facility] Colectomy post-op instructions: discussed with patient and provided information. Started metroNIDAZOLE 500 MG Oral Tablet, 2 (two) Tablet three times daily, #6, 1 day  starting 10/24/2020, No Refill. Local Order: Pharmacist Notes: Pharmacy Instructions: Take 2 tablets at 2pm, 3pm, and 10pm the day prior to your colon operation. Started Neomycin Sulfate 500 MG Oral Tablet, 2 (two) Tablet SEE NOTE, #6, 10/24/2020, No Refill. Local Order: Pharmacist Notes: TAKE TWO TABLETS AT 2 PM, 3 PM, AND 10 PM THE DAY PRIOR TO SURGERY   Micheal Hector, MD, FACS, MASCRS  Esophageal, Gastrointestinal & Colorectal Surgery Robotic and Minimally Invasive Surgery [facility] [address] [place], [place] [address] [phone] Fax [phone] Main/Paging  CONTACT INFORMATION: Weekday (9AM-5PM) concerns: Call [facility] main office at [phone] Weeknight (5PM-9AM) or Weekend/Holiday concerns: Check [email or web address] for General Surgery [facility] coverage (Please, do not use SecureChat as it is not reliable communication to operating surgeons for immediate patient care)

## 2020-11-18 NOTE — Patient Instructions (Addendum)
DUE TO COVID-19 ONLY ONE VISITOR IS ALLOWED TO COME WITH YOU AND STAY IN THE WAITING ROOM ONLY DURING PRE OP AND PROCEDURE DAY OF SURGERY. THE 1 VISITOR  MAY VISIT WITH YOU AFTER SURGERY IN YOUR PRIVATE ROOM DURING VISITING HOURS ONLY!  YOU NEED TO HAVE A COVID 19 TEST ON: 11/29/20 @ 8:30 AM, THIS TEST MUST BE DONE BEFORE SURGERY,  COVID TESTING SITE [place] [id number], IT IS ON THE RIGHT GOING OUT [address] APPROXIMATELY  2 MINUTES PAST [employer] ON THE RIGHT. ONCE YOUR COVID TEST IS COMPLETED,  PLEASE BEGIN THE QUARANTINE INSTRUCTIONS AS OUTLINED IN YOUR HANDOUT.                Gearl Baratta    Your procedure is scheduled on: 11/30/20   Report to [facility] Main  Entrance   Report to admitting at: 10:00 AM     Call this number if you have problems the morning of surgery [phone]    Remember: Clear liquids during the prep. Day.  NOTHING BY MOUTH EXCEPT CLEAR LIQUIDS UNTIL: 9:00 AM . PLEASE FINISH GATORADE DRINK PER SURGEON ORDER  WHICH NEEDS TO BE COMPLETED AT : 9:00 AM.  CLEAR LIQUID DIET  Foods Allowed                                                                     Foods Excluded  Coffee and tea, regular and decaf                             liquids that you cannot  Plain Jell-O any favor except red or purple                                           see through such as: Fruit ices (not with fruit pulp)                                     milk, soups, orange juice  Iced Popsicles                                    All solid food Carbonated beverages, regular and diet                                    Cranberry, grape and apple juices Sports drinks like Gatorade Lightly seasoned clear broth or consume(fat free) Sugar, honey syrup  Sample Menu Breakfast                                Lunch  Supper Cranberry juice                    Beef broth                            Chicken  broth Jell-O                                     Grape juice                           Apple juice Coffee or tea                        Jell-O                                      Popsicle                                                Coffee or tea                        Coffee or tea  _____________________________________________________________________   BRUSH YOUR TEETH MORNING OF SURGERY AND RINSE YOUR MOUTH OUT, NO CHEWING GUM CANDY OR MINTS.    Take these medicines the morning of surgery with A SIP OF WATER: omeprazole.  How to Manage Your Diabetes Before and After Surgery  Why is it important to control my blood sugar before and after surgery? . Improving blood sugar levels before and after surgery helps healing and can limit problems. . A way of improving blood sugar control is eating a healthy diet by: o  Eating less sugar and carbohydrates o  Increasing activity/exercise o  Talking with your doctor about reaching your blood sugar goals . High blood sugars (greater than 180 mg/dL) can raise your risk of infections and slow your recovery, so you will need to focus on controlling your diabetes during the weeks before surgery. . Make sure that the doctor who takes care of your diabetes knows about your planned surgery including the date and location.  How do I manage my blood sugar before surgery? . Check your blood sugar at least 4 times a day, starting 2 days before surgery, to make sure that the level is not too high or low. o Check your blood sugar the morning of your surgery when you wake up and every 2 hours until you get to the Short Stay unit. . If your blood sugar is less than 70 mg/dL, you will need to treat for low blood sugar: o Do not take insulin. o Treat a low blood sugar (less than 70 mg/dL) with  cup of clear juice (cranberry or apple), 4 glucose tablets, OR glucose gel. o Recheck blood sugar in 15 minutes after treatment (to make sure it is greater than 70  mg/dL). If your blood sugar is not greater than 70 mg/dL on recheck, call [phone] for further instructions. . Report your blood sugar to the short stay nurse when you get to Short Stay.  . If you are  admitted to the hospital after surgery: o Your blood sugar will be checked by the staff and you will probably be given insulin after surgery (instead of oral diabetes medicines) to make sure you have good blood sugar levels. o The goal for blood sugar control after surgery is 80-180 mg/dL.   WHAT DO I DO ABOUT MY DIABETES MEDICATION?  Marland Kitchen Do not take oral diabetes medicines (pills) the morning of surgery.  . THE DAY BEFORE SURGERY, take Metformin as usual.     . THE MORNING OF SURGERY, DO NOT TAKE ANY DIABETIC MEDICATIONS DAY OF YOUR SURGERY                               You may not have any metal on your body including hair pins and              piercings  Do not wear jewelry, lotions, powders or perfumes, deodorant             Men may shave face and neck.   Do not bring valuables to the hospital. [place].  Contacts, dentures or bridgework may not be worn into surgery.  Leave suitcase in the car. After surgery it may be brought to your room.     Patients discharged the day of surgery will not be allowed to drive home. IF YOU ARE HAVING SURGERY AND GOING HOME THE SAME DAY, YOU MUST HAVE AN ADULT TO DRIVE YOU HOME AND BE WITH YOU FOR 24 HOURS. YOU MAY GO HOME BY TAXI OR UBER OR ORTHERWISE, BUT AN ADULT MUST ACCOMPANY YOU HOME AND STAY WITH YOU FOR 24 HOURS.  Name and phone number of your driver:  Special Instructions: N/A              Please read over the following fact sheets you were given: _____________________________________________________________________    PLEASE BRING CPAP MASK [place]. DEVICE WILL BE PROVIDED!        [facility] - Preparing for Surgery Before surgery, you can play an important role.  Because skin  is not sterile, your skin needs to be as free of germs as possible.  You can reduce the number of germs on your skin by washing with CHG (chlorahexidine gluconate) soap before surgery.  CHG is an antiseptic cleaner which kills germs and bonds with the skin to continue killing germs even after washing. Please DO NOT use if you have an allergy to CHG or antibacterial soaps.  If your skin becomes reddened/irritated stop using the CHG and inform your nurse when you arrive at Short Stay. Do not shave (including legs and underarms) for at least 48 hours prior to the first CHG shower.  You may shave your face/neck. Please follow these instructions carefully:  1.  Shower with CHG Soap the night before surgery and the  morning of Surgery.  2.  If you choose to wash your hair, wash your hair first as usual with your  normal  shampoo.  3.  After you shampoo, rinse your hair and body thoroughly to remove the  shampoo.                           4.  Use CHG as you would any other liquid soap.  You can apply chg directly  to the skin and wash                       Gently with a scrungie or clean washcloth.  5.  Apply the CHG Soap to your body ONLY FROM THE NECK DOWN.   Do not use on face/ open                           Wound or open sores. Avoid contact with eyes, ears mouth and genitals (private parts).                       Wash face,  Genitals (private parts) with your normal soap.             6.  Wash thoroughly, paying special attention to the area where your surgery  will be performed.  7.  Thoroughly rinse your body with warm water from the neck down.  8.  DO NOT shower/wash with your normal soap after using and rinsing off  the CHG Soap.                9.  Pat yourself dry with a clean towel.            10.  Wear clean pajamas.            11.  Place clean sheets on your bed the night of your first shower and do not  sleep with pets. Day of Surgery : Do not apply any lotions/deodorants the morning of  surgery.  Please wear clean clothes to the hospital/surgery center.  FAILURE TO FOLLOW THESE INSTRUCTIONS MAY RESULT IN THE CANCELLATION OF YOUR SURGERY PATIENT SIGNATURE_________________________________  NURSE SIGNATURE__________________________________  ________________________________________________________________________   Adam Phenix  An incentive spirometer is a tool that can help keep your lungs clear and active. This tool measures how well you are filling your lungs with each breath. Taking long deep breaths may help reverse or decrease the chance of developing breathing (pulmonary) problems (especially infection) following:  A long period of time when you are unable to move or be active. BEFORE THE PROCEDURE   If the spirometer includes an indicator to show your best effort, your nurse or respiratory therapist will set it to a desired goal.  If possible, sit up straight or lean slightly forward. Try not to slouch.  Hold the incentive spirometer in an upright position. INSTRUCTIONS FOR USE  1. Sit on the edge of your bed if possible, or sit up as far as you can in bed or on a chair. 2. Hold the incentive spirometer in an upright position. 3. Breathe out normally. 4. Place the mouthpiece in your mouth and seal your lips tightly around it. 5. Breathe in slowly and as deeply as possible, raising the piston or the ball toward the top of the column. 6. Hold your breath for 3-5 seconds or for as long as possible. Allow the piston or ball to fall to the bottom of the column. 7. Remove the mouthpiece from your mouth and breathe out normally. 8. Rest for a few seconds and repeat Steps 1 through 7 at least 10 times every 1-2 hours when you are awake. Take your time and take a few normal breaths between deep breaths. 9. The spirometer may include an indicator to show your best effort. Use the indicator as a goal to work toward  during each repetition. 10. After each set of 10  deep breaths, practice coughing to be sure your lungs are clear. If you have an incision (the cut made at the time of surgery), support your incision when coughing by placing a pillow or rolled up towels firmly against it. Once you are able to get out of bed, walk around indoors and cough well. You may stop using the incentive spirometer when instructed by your caregiver.  RISKS AND COMPLICATIONS  Take your time so you do not get dizzy or light-headed.  If you are in pain, you may need to take or ask for pain medication before doing incentive spirometry. It is harder to take a deep breath if you are having pain. AFTER USE  Rest and breathe slowly and easily.  It can be helpful to keep track of a log of your progress. Your caregiver can provide you with a simple table to help with this. If you are using the spirometer at home, follow these instructions: [place] IF:   You are having difficultly using the spirometer.  You have trouble using the spirometer as often as instructed.  Your pain medication is not giving enough relief while using the spirometer.  You develop fever of 100.5 F (38.1 C) or higher. SEEK IMMEDIATE MEDICAL CARE IF:   You cough up bloody sputum that had not been present before.  You develop fever of 102 F (38.9 C) or greater.  You develop worsening pain at or near the incision site. MAKE SURE YOU:   Understand these instructions.  Will watch your condition.  Will get help right away if you are not doing well or get worse. Document Released: 10/29/2006 Document Revised: 09/10/2011 Document Reviewed: 12/30/2006 [facility] Patient Information 2014 [place], [place].   ________________________________________________________________________

## 2020-11-21 ENCOUNTER — Encounter (HOSPITAL_COMMUNITY)
Admission: RE | Admit: 2020-11-21 | Discharge: 2020-11-21 | Disposition: A | Discharge status: Home / Self Care | Payer: No Typology Code available for payment source | Source: Ambulatory Visit | Attending: Surgery | Admitting: Surgery

## 2020-11-21 ENCOUNTER — Other Ambulatory Visit: Payer: Self-pay

## 2020-11-21 ENCOUNTER — Encounter (HOSPITAL_COMMUNITY): Payer: Self-pay

## 2020-11-21 DIAGNOSIS — Z01812 Encounter for preprocedural laboratory examination: Secondary | ICD-10-CM | POA: Insufficient documentation

## 2020-11-21 LAB — BASIC METABOLIC PANEL
Anion gap: 6 (ref 5–15)
BUN: 16 mg/dL (ref 8–23)
CO2: 24 mmol/L (ref 22–32)
Calcium: 8.6 mg/dL — ABNORMAL LOW (ref 8.9–10.3)
Chloride: 104 mmol/L (ref 98–111)
Creatinine, Ser: 1.23 mg/dL (ref 0.61–1.24)
GFR, Estimated: >60 mL/min (ref >60)
Glucose, Bld: 133 mg/dL — ABNORMAL HIGH (ref 70–99)
Potassium: 3.9 mmol/L (ref 3.5–5.1)
Sodium: 134 mmol/L — ABNORMAL LOW (ref 135–145)

## 2020-11-21 LAB — CBC
HCT: 45 % (ref 39.0–52.0)
Hemoglobin: 14.2 g/dL (ref 13.0–17.0)
MCH: 26.2 pg (ref 26.0–34.0)
MCHC: 31.6 g/dL (ref 30.0–36.0)
MCV: 83.2 fL (ref 80.0–100.0)
Platelets: 212 10*3/uL (ref 150–400)
RBC: 5.41 MIL/uL (ref 4.22–5.81)
RDW: 15.1 % (ref 11.5–15.5)
WBC: 6.8 10*3/uL (ref 4.0–10.5)
nRBC: 0 % (ref 0.0–0.2)

## 2020-11-21 LAB — HEMOGLOBIN A1C
Hgb A1c MFr Bld: 7.8 % — ABNORMAL HIGH (ref 4.8–5.6)
Mean Plasma Glucose: 177.16 mg/dL

## 2020-11-21 LAB — GLUCOSE, CAPILLARY: Glucose-Capillary: 140 mg/dL — ABNORMAL HIGH (ref 70–99)

## 2020-11-21 NOTE — Progress Notes (Addendum)
COVID Vaccine Completed: Yes Date COVID Vaccine completed: 08/2019 COVID vaccine manufacturer: [place]   [facility]P - [facility] Cardi[facility] -   Chest x-ray - 10/14/20 EKG - 10/14/20 Stress Test -  ECHO -  Cardiac Cath -  Pacemaker/ICD device last checked:  Sleep Study -  CPAP -   Fasting Blood Sugar - 100's Checks Blood Sugar __2___ times a day  Blood Thinner Instructions: Aspirin Instructions: On hold as per surgeon Last Dose:  Anesthesia review: DIA,HTN,OSA(CPAP),chest pain (10/15/20)  Patient denies shortness of breath, fever, cough and chest pain at PAT appointment   Patient verbalized understanding of instructions that were given to them at the PAT appointment. Patient was also instructed that they will need to review over the PAT instructions again at home before surgery.

## 2020-11-29 ENCOUNTER — Other Ambulatory Visit (HOSPITAL_COMMUNITY)
Admission: RE | Admit: 2020-11-29 | Discharge: 2020-11-29 | Disposition: A | Discharge status: Home / Self Care | Payer: No Typology Code available for payment source | Source: Ambulatory Visit | Attending: Surgery | Admitting: Surgery

## 2020-11-29 DIAGNOSIS — U071 COVID-19: Secondary | ICD-10-CM | POA: Insufficient documentation

## 2020-11-29 DIAGNOSIS — Z01812 Encounter for preprocedural laboratory examination: Secondary | ICD-10-CM | POA: Insufficient documentation

## 2020-11-29 LAB — SARS CORONAVIRUS 2 (TAT 6-24 HRS): SARS Coronavirus 2: POSITIVE — AB

## 2020-11-29 MED ORDER — BUPIVACAINE LIPOSOME 1.3 % IJ SUSP
20.0000 mL | Freq: Once | INTRAMUSCULAR | Status: DC
  Filled 2020-11-29: qty 20

## 2020-11-30 ENCOUNTER — Encounter (HOSPITAL_COMMUNITY): Admission: RE | Payer: Self-pay | Source: Home / Self Care

## 2020-11-30 ENCOUNTER — Telehealth: Payer: Self-pay

## 2020-11-30 ENCOUNTER — Encounter (HOSPITAL_COMMUNITY): Payer: Self-pay | Admitting: Certified Registered Nurse Anesthetist

## 2020-11-30 ENCOUNTER — Inpatient Hospital Stay (HOSPITAL_COMMUNITY)
Admission: RE | Admit: 2020-11-30 | Payer: No Typology Code available for payment source | Source: Home / Self Care | Admitting: Surgery

## 2020-11-30 ENCOUNTER — Telehealth (HOSPITAL_COMMUNITY): Payer: Self-pay

## 2020-11-30 SURGERY — COLECTOMY, PARTIAL, ROBOT-ASSISTED, LAPAROSCOPIC
Anesthesia: General

## 2020-11-30 NOTE — Telephone Encounter (Signed)
Called to discuss with patient about COVID-19 symptoms and the use of one of the available treatments for those with mild to moderate Covid symptoms and at a high risk of hospitalization.  Pt appears to qualify for outpatient treatment due to co-morbid conditions and/or a member of an at-risk group in accordance with the FDA Emergency Use Authorization.    Symptom onset: Cold symptoms 11/23/20 Vaccinated: No Booster? No Immunocompromised? No Qualifiers: DM NIH Criteria: Tier 1   Declines any further treatment.   Micheal Gomez

## 2020-11-30 NOTE — Progress Notes (Signed)
Patient tested Covid +pre-procedure testing. Dr Johney [place] notified. States to cancel and reschedule per protocol. Pt notified. Reports had some " minor flu s/s couple days ago."  He will call the office to reschedule.

## 2020-11-30 NOTE — Telephone Encounter (Signed)
11/30/20 - Contacted surgeon - left a detailed message on his voicemail advising of patient's positive covid19 lab results. MBM

## 2020-12-14 ENCOUNTER — Ambulatory Visit: Payer: No Typology Code available for payment source | Admitting: Orthopaedic Surgery

## 2020-12-14 ENCOUNTER — Telehealth: Payer: Self-pay | Admitting: Orthopaedic Surgery

## 2020-12-14 NOTE — Telephone Encounter (Signed)
Received call from Eads with the [facility] she advised the disc of the CT scan will not be there the time of the patient's appointment today. Abby said the CT will be mailed.  Abby said to contact her if needed. The number to contact Abby is [phone]

## 2020-12-14 NOTE — Telephone Encounter (Signed)
Cassandra called patient. He will pick up CD and appt is changed to Friday afternoon.

## 2020-12-16 ENCOUNTER — Other Ambulatory Visit: Payer: Self-pay

## 2020-12-16 ENCOUNTER — Encounter: Payer: Self-pay | Admitting: Orthopaedic Surgery

## 2020-12-16 ENCOUNTER — Ambulatory Visit (INDEPENDENT_AMBULATORY_CARE_PROVIDER_SITE_OTHER): Payer: No Typology Code available for payment source | Admitting: Orthopaedic Surgery

## 2020-12-16 DIAGNOSIS — S43002A Unspecified subluxation of left shoulder joint, initial encounter: Secondary | ICD-10-CM

## 2020-12-16 NOTE — Progress Notes (Signed)
Office Visit Note   Patient: Micheal Gomez           Date of Birth: 1956-05-27           MRN: [id number] Visit Date: 12/16/2020              [facility]ed by: [facility] [address] [place],  [place] [address] [facility]   Assessment & Plan: Visit Diagnoses:  1. Subluxation of left shoulder joint, initial encounter     Plan: Patient can work on some gentle range of motion on his shoulder on his own.  He can toss a rope over a door and use it as a pulley.  Woke his fingers up either park of a tree or a brick wall rough surface to try to get flexion and not lose any range of motion.  We discussed avoiding internal rotation with his hand past posterior axillary line and avoid pushing on objects such as a car or door if it stuck with the injured left arm.  He can work on gentle range of motion and occasional use anti-inflammatories.  We discussed problems with regular anti-inflammatories with diabetes and kidney problems that could be a problem.  I think he can take it for a few days without problems.  I will check him back again in 4 weeks.  If he is having persistent problems or recurrent instability problems then we can consider diagnostic MRI scanning.  He does have a reverse Bankart lesion and likely has some posterior labral tearing.  Many image slices on the CT scan suggest that this is an old injury.  Recheck 4 weeks.  Follow-Up Instructions: No follow-ups on file.   Orders:  No orders of the defined types were placed in this encounter.  No orders of the defined types were placed in this encounter.  HPI: 65 year old male referred by the [facility] for an injury to his shoulder 3 weeks ago when he fell off the front porch.  He tried to catch himself outstretched with his left hand and had sharp pain.  He has had difficulty moving his shoulder since that time he is right-hand dominant.  He used some Salonpas patches he is used prednisone.  He has had some range of  motion of his shoulder return but describes stiffness and pain problems with reaching past the posterior axillary line with his left hand.  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  He played football in high school and remember injuring his shoulder a few times with popping.  Patient had a CT scan done on 12/12/2020 for comparison with plain radiographs May/18/22.  This shows some secondary degenerative changes of his shoulder and a reverse Hill-Sachs lesion consistent with posterior shoulder subluxation/reduction.  Some portions of this appear rounded and may represent old injury and possible some portion acute. Patient denies neck pain and numbness and tingling in his fingers.  No problems with his opposite right shoulder.  Review of Systems positive diabetes A1c less than 6.  Patient is on Ozempic and Glucophage for his diabetes and also Levemir.   Objective: Vital Signs: BP (!) 147/93   Pulse 93   Ht 5\' 9"  (1.753 m)   Wt 258 lb (117 kg)   BMI 38.10 kg/m   Physical Exam Constitutional:      Appearance: He is well-developed.  HENT:     Head: Normocephalic and atraumatic.     Right Ear: External ear normal.  Left Ear: External ear normal.  Eyes:     Pupils: Pupils are equal, round, and reactive to light.  Neck:     Thyroid: No thyromegaly.     Trachea: No tracheal deviation.  Cardiovascular:     Rate and Rhythm: Normal rate.  Pulmonary:     Effort: Pulmonary effort is normal.     Breath sounds: No wheezing.  Abdominal:     General: Bowel sounds are normal.     Palpations: Abdomen is soft.  Musculoskeletal:     Cervical back: Neck supple.  Skin:    General: Skin is warm and dry.     Capillary Refill: Capillary refill takes less than 2 seconds.  Neurological:     Mental Status: He is alert and oriented to person, place, and time.  Psychiatric:        Behavior: Behavior normal.        Thought Content: Thought content normal.        Judgment: Judgment  normal.    Ortho Exam patient can get his arm top of his head.  Reaches posteriorly back posterior axillary line with discomfort.  Sensation hand is intact.  Good cervical range of motion.  Biceps triceps is strong.  Sensation the hand is intact.  Specialty Comments:  No specialty comments available.  Imaging: No results found.   PMFS History: Patient Active Problem List   Diagnosis Date Noted   Shoulder subluxation, left 12/16/2020   Transaminitis    Hyperglycemia    End-stage renal disease on hemodialysis (HCC)    Non-traumatic rhabdomyolysis    OSA on CPAP    Obesity hypoventilation syndrome (HCC)    Acute encephalopathy    Acute renal failure ([place])    Diabetic ketoacidosis with coma associated with type 2 diabetes mellitus ([place])    DKA (diabetic ketoacidoses) 08/05/2015   Atypical chest pain 08/26/2013   Past Medical History:  Diagnosis Date   Diabetes mellitus without complication (HCC)    High cholesterol    History of stomach ulcers    Hypertension     Family History  Problem Relation Age of Onset   Diabetes Father    Hypertension Father    Colon cancer Neg Hx    Stomach cancer Neg Hx    Rectal cancer Neg Hx    Esophageal cancer Neg Hx     Past Surgical History:  Procedure Laterality Date   APPENDECTOMY     polyp removal     TONSILLECTOMY     Social History   Occupational History   Not on file  Tobacco Use   Smoking status: Former    Pack years: 0.00    Types: Cigarettes   Smokeless tobacco: Never  Vaping Use   Vaping Use: Never used  Substance and Sexual Activity   Alcohol use: No   Drug use: No   Sexual activity: Not on file

## 2020-12-22 NOTE — Patient Instructions (Addendum)
DUE TO COVID-19 ONLY ONE VISITOR IS ALLOWED TO COME WITH YOU AND STAY IN THE WAITING ROOM ONLY DURING PRE OP AND PROCEDURE.   **NO VISITORS ARE ALLOWED IN THE SHORT STAY AREA OR RECOVERY ROOM!!**  IF YOU WILL BE ADMITTED INTO THE HOSPITAL YOU ARE ALLOWED ONLY TWO SUPPORT PEOPLE DURING VISITATION HOURS ONLY (10AM -8PM)   The support person(s) may change daily. The support person(s) must pass our screening, gel in and out, and wear a mask at all times, including in the patient's room. Patients must also wear a mask when staff or their support person are in the room.  No visitors under the age of 54. Any visitor under the age of 58 must be accompanied by an adult.          Your procedure is scheduled on: Wednesday, 01-11-21   Report to [facility] Main  Entrance   Report to admitting at 11:30 AM   Call this number if you have problems the morning of surgery [phone]   Clear liquids day of prep to avoid dehydration.   Follow prep from surgeon's office   Do not eat food :After Midnight.   May have liquids until 10:30 AM  day of surgery  CLEAR LIQUID DIET  Foods Allowed                                                                     Foods Excluded  Water, Black Coffee and tea, regular and decaf              liquids that you cannot  Plain Jell-O in any flavor  (No red)                                    see through such as: Fruit ices (not with fruit pulp)                                      milk, soups, orange juice              Iced Popsicles (No red)                                      All solid food                                   Apple juices Sports drinks like Gatorade (No red) Lightly seasoned clear broth or consume(fat free) Sugar, honey syrup  Sample Menu Breakfast                                Lunch                                     Supper Cranberry juice  Beef broth                            Chicken broth Jell-O                                      Grape juice                           Apple juice Coffee or tea                        Jell-O                                      Popsicle                                                Coffee or tea                        Coffee or tea        Drink 2 G2 drinks the night before surgery by 10 PM.  Complete one G2 drink the morning of surgery 3 hours prior to scheduled surgery by 10:30 AM.     The day of surgery:  Drink ONE (1)  G2 by am the morning of surgery. Drink in one sitting. Do not sip.  This drink was given to you during your hospital  pre-op appointment visit. Nothing else to drink after completing the  G2.          If you have questions, please contact your surgeon's office.     Oral Hygiene is also important to reduce your risk of infection.                                    Remember - BRUSH YOUR TEETH THE MORNING OF SURGERY WITH YOUR REGULAR TOOTHPASTE   Do NOT smoke after Midnight   Take these medicines the morning of surgery with A SIP OF WATER: Atorvastatin, Amlodipine, Omeprazole  How to Manage Your Diabetes Before and After Surgery  Why is it important to control my blood sugar before and after surgery? Improving blood sugar levels before and after surgery helps healing and can limit problems. A way of improving blood sugar control is eating a healthy diet by:  Eating less sugar and carbohydrates  Increasing activity/exercise  Talking with your doctor about reaching your blood sugar goals High blood sugars (greater than 180 mg/dL) can raise your risk of infections and slow your recovery, so you will need to focus on controlling your diabetes during the weeks before surgery. Make sure that the doctor who takes care of your diabetes knows about your planned surgery including the date and location.  How do I manage my blood sugar before surgery? Check your blood sugar at least 4 times a day, starting 2 days before surgery, to make sure that  the level is not too high or low. Check your blood sugar the morning of  your surgery when you wake up and every 2 hours until you get to the Short Stay unit. If your blood sugar is less than 70 mg/dL, you will need to treat for low blood sugar: Do not take insulin. Treat a low blood sugar (less than 70 mg/dL) with  cup of clear juice (cranberry or apple), 4 glucose tablets, OR glucose gel. Recheck blood sugar in 15 minutes after treatment (to make sure it is greater than 70 mg/dL). If your blood sugar is not greater than 70 mg/dL on recheck, call [phone] for further instructions. Report your blood sugar to the short stay nurse when you get to Short Stay.  If you are admitted to the hospital after surgery: Your blood sugar will be checked by the staff and you will probably be given insulin after surgery (instead of oral diabetes medicines) to make sure you have good blood sugar levels. The goal for blood sugar control after surgery is 80-180 mg/dL.   WHAT DO I DO ABOUT MY DIABETES MEDICATION?  Do not take oral diabetes medicines (pills) the morning of surgery.  THE DAY BEFORE SURGERY:  Take Metformin as prescribed.                               Insulin Levemir do 15 units at dinner        THE MORNING OF SURGERY:  Do not take Metformin                  Insulin Levemir do 15 units in the morning    Reviewed and Endorsed by [facility] Patient Education Committee, August 2015                               You may not have any metal on your body including hair pins, jewelry, and body piercing             Do not wear lotions, powders, cologne, or deodorant              Men may shave face and neck.   Do not bring valuables to the hospital. [place].   Contacts, dentures or bridgework may not be worn into surgery.   Bring small overnight bag day of surgery.   Bring CPAP mask and tubing day of surgery  Please read over the following fact  sheets you were given: IF YOU HAVE QUESTIONS ABOUT YOUR PRE OP INSTRUCTIONS PLEASE CALL [place] - Preparing for Surgery Before surgery, you can play an important role.  Because skin is not sterile, your skin needs to be as free of germs as possible.  You can reduce the number of germs on your skin by washing with CHG (chlorahexidine gluconate) soap before surgery.  CHG is an antiseptic cleaner which kills germs and bonds with the skin to continue killing germs even after washing. Please DO NOT use if you have an allergy to CHG or antibacterial soaps.  If your skin becomes reddened/irritated stop using the CHG and inform your nurse when you arrive at Short Stay. Do not shave (including legs and underarms) for at least 48 hours prior to the first CHG shower.  You may shave your face/neck.  Please follow these instructions carefully:  1.  Shower with CHG Soap the night before surgery and  the  morning of surgery.  2.  If you choose to wash your hair, wash your hair first as usual with your normal  shampoo.  3.  After you shampoo, rinse your hair and body thoroughly to remove the shampoo.                             4.  Use CHG as you would any other liquid soap.  You can apply chg directly to the skin and wash.  Gently with a scrungie or clean washcloth.  5.  Apply the CHG Soap to your body ONLY FROM THE NECK DOWN.   Do   not use on face/ open                           Wound or open sores. Avoid contact with eyes, ears mouth and   genitals (private parts).                       Wash face,  Genitals (private parts) with your normal soap.             6.  Wash thoroughly, paying special attention to the area where your    surgery  will be performed.  7.  Thoroughly rinse your body with warm water from the neck down.  8.  DO NOT shower/wash with your normal soap after using and rinsing off the CHG Soap.                9.  Pat yourself dry with a clean towel.            10.  Wear  clean pajamas.            11.  Place clean sheets on your bed the night of your first shower and do not  sleep with pets. Day of Surgery : Do not apply any lotions/deodorants the morning of surgery.  Please wear clean clothes to the hospital/surgery center.  FAILURE TO FOLLOW THESE INSTRUCTIONS MAY RESULT IN THE CANCELLATION OF YOUR SURGERY  PATIENT SIGNATURE_________________________________  NURSE SIGNATURE__________________________________  ________________________________________________________________________    Micheal Gomez  An incentive spirometer is a tool that can help keep your lungs clear and active. This tool measures how well you are filling your lungs with each breath. Taking long deep breaths may help reverse or decrease the chance of developing breathing (pulmonary) problems (especially infection) following: A long period of time when you are unable to move or be active. BEFORE THE PROCEDURE  If the spirometer includes an indicator to show your best effort, your nurse or respiratory therapist will set it to a desired goal. If possible, sit up straight or lean slightly forward. Try not to slouch. Hold the incentive spirometer in an upright position. INSTRUCTIONS FOR USE  Sit on the edge of your bed if possible, or sit up as far as you can in bed or on a chair. Hold the incentive spirometer in an upright position. Breathe out normally. Place the mouthpiece in your mouth and seal your lips tightly around it. Breathe in slowly and as deeply as possible, raising the piston or the ball toward the top of the column. Hold your breath for 3-5 seconds or for as long as possible. Allow the piston or ball to fall to the bottom of the column. Remove the mouthpiece from your mouth and breathe out  normally. Rest for a few seconds and repeat Steps 1 through 7 at least 10 times every 1-2 hours when you are awake. Take your time and take a few normal breaths between deep  breaths. The spirometer may include an indicator to show your best effort. Use the indicator as a goal to work toward during each repetition. After each set of 10 deep breaths, practice coughing to be sure your lungs are clear. If you have an incision (the cut made at the time of surgery), support your incision when coughing by placing a pillow or rolled up towels firmly against it. Once you are able to get out of bed, walk around indoors and cough well. You may stop using the incentive spirometer when instructed by your caregiver.  RISKS AND COMPLICATIONS Take your time so you do not get dizzy or light-headed. If you are in pain, you may need to take or ask for pain medication before doing incentive spirometry. It is harder to take a deep breath if you are having pain. AFTER USE Rest and breathe slowly and easily. It can be helpful to keep track of a log of your progress. Your caregiver can provide you with a simple table to help with this. If you are using the spirometer at home, follow these instructions: [place] IF:  You are having difficultly using the spirometer. You have trouble using the spirometer as often as instructed. Your pain medication is not giving enough relief while using the spirometer. You develop fever of 100.5 F (38.1 C) or higher. SEEK IMMEDIATE MEDICAL CARE IF:  You cough up bloody sputum that had not been present before. You develop fever of 102 F (38.9 C) or greater. You develop worsening pain at or near the incision site. MAKE SURE YOU:  Understand these instructions. Will watch your condition. Will get help right away if you are not doing well or get worse. Document Released: 10/29/2006 Document Revised: 09/10/2011 Document Reviewed: 12/30/2006 [employer] Patient Information 2014 [employer], [place].   ________________________________________________________________________     WHAT IS A BLOOD TRANSFUSION? Blood Transfusion Information  A  transfusion is the replacement of blood or some of its parts. Blood is made up of multiple cells which provide different functions. Red blood cells carry oxygen and are used for blood loss replacement. White blood cells fight against infection. Platelets control bleeding. Plasma helps clot blood. Other blood products are available for specialized needs, such as hemophilia or other clotting disorders. BEFORE THE TRANSFUSION  Who gives blood for transfusions?  Healthy volunteers who are fully evaluated to make sure their blood is safe. This is blood bank blood. Transfusion therapy is the safest it has ever been in the practice of medicine. Before blood is taken from a donor, a complete history is taken to make sure that person has no history of diseases nor engages in risky social behavior (examples are intravenous drug use or sexual activity with multiple partners). The donor's travel history is screened to minimize risk of transmitting infections, such as malaria. The donated blood is tested for signs of infectious diseases, such as HIV and hepatitis. The blood is then tested to be sure it is compatible with you in order to minimize the chance of a transfusion reaction. If you or a relative donates blood, this is often done in anticipation of surgery and is not appropriate for emergency situations. It takes many days to process the donated blood. RISKS AND COMPLICATIONS Although transfusion therapy is very safe and saves  many lives, the main dangers of transfusion include:  Getting an infectious disease. Developing a transfusion reaction. This is an allergic reaction to something in the blood you were given. Every precaution is taken to prevent this. The decision to have a blood transfusion has been considered carefully by your caregiver before blood is given. Blood is not given unless the benefits outweigh the risks. AFTER THE TRANSFUSION Right after receiving a blood transfusion, you will usually  feel much better and more energetic. This is especially true if your red blood cells have gotten low (anemic). The transfusion raises the level of the red blood cells which carry oxygen, and this usually causes an energy increase. The nurse administering the transfusion will monitor you carefully for complications. HOME CARE INSTRUCTIONS  No special instructions are needed after a transfusion. You may find your energy is better. Speak with your caregiver about any limitations on activity for underlying diseases you may have. SEEK MEDICAL CARE IF:  Your condition is not improving after your transfusion. You develop redness or irritation at the intravenous (IV) site. SEEK IMMEDIATE MEDICAL CARE IF:  Any of the following symptoms occur over the next 12 hours: Shaking chills. You have a temperature by mouth above 102 F (38.9 C), not controlled by medicine. Chest, back, or muscle pain. People around you feel you are not acting correctly or are confused. Shortness of breath or difficulty breathing. Dizziness and fainting. You get a rash or develop hives. You have a decrease in urine output. Your urine turns a dark color or changes to pink, red, or brown. Any of the following symptoms occur over the next 10 days: You have a temperature by mouth above 102 F (38.9 C), not controlled by medicine. Shortness of breath. Weakness after normal activity. The white part of the eye turns yellow (jaundice). You have a decrease in the amount of urine or are urinating less often. Your urine turns a dark color or changes to pink, red, or brown. Document Released: 06/15/2000 Document Revised: 09/10/2011 Document Reviewed: 02/02/2008 [facility] Patient Information 2014 [place], [place].  _______________________________________________________________________

## 2020-12-22 NOTE — Progress Notes (Addendum)
COVID Vaccine Completed:  Yes x2 Date COVID Vaccine completed: Has received booster: COVID vaccine manufacturer: [employer]   Date of COVID positive in last 90 days: Yes, 11-29-20 results in [place]  PCP - [place] Cardiologist - N/A  Chest x-ray - 10-14-20 Epic EKG - 10-14-20 Epic Stress Test - N/A ECHO - N/A Cardiac Cath - N/A Pacemaker/ICD device last checked: Spinal Cord Stimulator:  Sleep Study - Yes, +sleep apnea CPAP - Yes  Fasting Blood Sugar - 120 to 130 Checks Blood Sugar - 3  times a day  Blood Thinner Instructions: Aspirin Instructions:  ASA 81 mg.  To stop one week prior per patient.  Last Dose:  Activity level:  Can go up a flight of stairs and perform activities of daily living without stopping and without symptoms of chest pain or shortness of breath.      Anesthesia review: ER visit for chest pain 10/15/20.  OSA with CPAP, DM, HTN  Patient denies shortness of breath, fever, cough and chest pain at PAT appointment   Patient verbalized understanding of instructions that were given to them at the PAT appointment. Patient was also instructed that they will need to review over the PAT instructions again at home before surgery.

## 2020-12-27 ENCOUNTER — Other Ambulatory Visit (HOSPITAL_COMMUNITY): Payer: Self-pay

## 2021-01-04 ENCOUNTER — Other Ambulatory Visit: Payer: Self-pay

## 2021-01-04 ENCOUNTER — Ambulatory Visit: Payer: No Typology Code available for payment source | Admitting: Orthopaedic Surgery

## 2021-01-04 ENCOUNTER — Encounter (HOSPITAL_COMMUNITY)
Admission: RE | Admit: 2021-01-04 | Discharge: 2021-01-04 | Disposition: A | Discharge status: Home / Self Care | Payer: No Typology Code available for payment source | Source: Ambulatory Visit | Attending: Surgery | Admitting: Surgery

## 2021-01-04 ENCOUNTER — Encounter (HOSPITAL_COMMUNITY): Payer: Self-pay

## 2021-01-04 DIAGNOSIS — Z01812 Encounter for preprocedural laboratory examination: Secondary | ICD-10-CM | POA: Diagnosis present

## 2021-01-04 HISTORY — DX: COVID-19: U07.1

## 2021-01-04 LAB — CBC
HCT: 44.4 % (ref 39.0–52.0)
Hemoglobin: 14.1 g/dL (ref 13.0–17.0)
MCH: 26.5 pg (ref 26.0–34.0)
MCHC: 31.8 g/dL (ref 30.0–36.0)
MCV: 83.3 fL (ref 80.0–100.0)
Platelets: 239 10*3/uL (ref 150–400)
RBC: 5.33 MIL/uL (ref 4.22–5.81)
RDW: 15.4 % (ref 11.5–15.5)
WBC: 6.8 10*3/uL (ref 4.0–10.5)
nRBC: 0 % (ref 0.0–0.2)

## 2021-01-04 LAB — BASIC METABOLIC PANEL
Anion gap: 9 (ref 5–15)
BUN: 18 mg/dL (ref 8–23)
CO2: 24 mmol/L (ref 22–32)
Calcium: 8.9 mg/dL (ref 8.9–10.3)
Chloride: 102 mmol/L (ref 98–111)
Creatinine, Ser: 0.99 mg/dL (ref 0.61–1.24)
GFR, Estimated: >60 mL/min (ref >60)
Glucose, Bld: 129 mg/dL — ABNORMAL HIGH (ref 70–99)
Potassium: 4.4 mmol/L (ref 3.5–5.1)
Sodium: 135 mmol/L (ref 135–145)

## 2021-01-04 LAB — GLUCOSE, CAPILLARY: Glucose-Capillary: 129 mg/dL — ABNORMAL HIGH (ref 70–99)

## 2021-01-10 MED ORDER — SODIUM CHLORIDE 0.9 % IV SOLN
2.0000 g | INTRAVENOUS | Status: AC
  Administered 2021-01-11: 2 g via INTRAVENOUS
  Filled 2021-01-10: qty 2

## 2021-01-11 ENCOUNTER — Encounter (HOSPITAL_COMMUNITY): Payer: Self-pay | Admitting: Surgery

## 2021-01-11 ENCOUNTER — Encounter (HOSPITAL_COMMUNITY)
Admission: RE | Disposition: A | Discharge status: Home / Self Care | Payer: Self-pay | Source: Home / Self Care | Attending: Surgery

## 2021-01-11 ENCOUNTER — Inpatient Hospital Stay (HOSPITAL_COMMUNITY): Payer: No Typology Code available for payment source | Admitting: Emergency Medicine

## 2021-01-11 ENCOUNTER — Inpatient Hospital Stay (HOSPITAL_COMMUNITY): Payer: No Typology Code available for payment source | Admitting: Certified Registered Nurse Anesthetist

## 2021-01-11 ENCOUNTER — Inpatient Hospital Stay (HOSPITAL_COMMUNITY)
Admission: RE | Admit: 2021-01-11 | Discharge: 2021-01-17 | DRG: 330 | Disposition: A | Discharge status: Home / Self Care | Payer: No Typology Code available for payment source | Attending: Surgery | Admitting: Surgery

## 2021-01-11 ENCOUNTER — Other Ambulatory Visit: Payer: Self-pay

## 2021-01-11 DIAGNOSIS — I10 Essential (primary) hypertension: Secondary | ICD-10-CM | POA: Diagnosis present

## 2021-01-11 DIAGNOSIS — K635 Polyp of colon: Secondary | ICD-10-CM | POA: Diagnosis present

## 2021-01-11 DIAGNOSIS — Z6838 Body mass index (BMI) 38.0-38.9, adult: Secondary | ICD-10-CM

## 2021-01-11 DIAGNOSIS — Z79899 Other long term (current) drug therapy: Secondary | ICD-10-CM

## 2021-01-11 DIAGNOSIS — Z86010 Personal history of colonic polyps: Secondary | ICD-10-CM | POA: Diagnosis not present

## 2021-01-11 DIAGNOSIS — Z87891 Personal history of nicotine dependence: Secondary | ICD-10-CM

## 2021-01-11 DIAGNOSIS — Z833 Family history of diabetes mellitus: Secondary | ICD-10-CM

## 2021-01-11 DIAGNOSIS — N182 Chronic kidney disease, stage 2 (mild): Secondary | ICD-10-CM

## 2021-01-11 DIAGNOSIS — Z9049 Acquired absence of other specified parts of digestive tract: Secondary | ICD-10-CM | POA: Diagnosis not present

## 2021-01-11 DIAGNOSIS — R1110 Vomiting, unspecified: Secondary | ICD-10-CM

## 2021-01-11 DIAGNOSIS — K660 Peritoneal adhesions (postprocedural) (postinfection): Secondary | ICD-10-CM | POA: Diagnosis present

## 2021-01-11 DIAGNOSIS — E7800 Pure hypercholesterolemia, unspecified: Secondary | ICD-10-CM | POA: Diagnosis present

## 2021-01-11 DIAGNOSIS — K219 Gastro-esophageal reflux disease without esophagitis: Secondary | ICD-10-CM

## 2021-01-11 DIAGNOSIS — D123 Benign neoplasm of transverse colon: Secondary | ICD-10-CM | POA: Diagnosis present

## 2021-01-11 DIAGNOSIS — K6389 Other specified diseases of intestine: Secondary | ICD-10-CM | POA: Diagnosis present

## 2021-01-11 DIAGNOSIS — E1122 Type 2 diabetes mellitus with diabetic chronic kidney disease: Secondary | ICD-10-CM | POA: Diagnosis present

## 2021-01-11 DIAGNOSIS — G4733 Obstructive sleep apnea (adult) (pediatric): Secondary | ICD-10-CM

## 2021-01-11 DIAGNOSIS — E1143 Type 2 diabetes mellitus with diabetic autonomic (poly)neuropathy: Secondary | ICD-10-CM

## 2021-01-11 DIAGNOSIS — Z7982 Long term (current) use of aspirin: Secondary | ICD-10-CM

## 2021-01-11 DIAGNOSIS — I129 Hypertensive chronic kidney disease with stage 1 through stage 4 chronic kidney disease, or unspecified chronic kidney disease: Secondary | ICD-10-CM | POA: Diagnosis present

## 2021-01-11 DIAGNOSIS — E662 Morbid (severe) obesity with alveolar hypoventilation: Secondary | ICD-10-CM | POA: Diagnosis present

## 2021-01-11 DIAGNOSIS — Z794 Long term (current) use of insulin: Secondary | ICD-10-CM | POA: Diagnosis not present

## 2021-01-11 DIAGNOSIS — K3184 Gastroparesis: Secondary | ICD-10-CM | POA: Diagnosis present

## 2021-01-11 DIAGNOSIS — E119 Type 2 diabetes mellitus without complications: Secondary | ICD-10-CM

## 2021-01-11 DIAGNOSIS — Z8249 Family history of ischemic heart disease and other diseases of the circulatory system: Secondary | ICD-10-CM

## 2021-01-11 DIAGNOSIS — Z8711 Personal history of peptic ulcer disease: Secondary | ICD-10-CM | POA: Diagnosis not present

## 2021-01-11 HISTORY — DX: Acute kidney failure, unspecified: N17.9

## 2021-01-11 HISTORY — DX: Type 2 diabetes mellitus with ketoacidosis with coma: E11.11

## 2021-01-11 HISTORY — DX: Encephalopathy, unspecified: G93.40

## 2021-01-11 LAB — TYPE AND SCREEN
ABO/RH(D): B POS
Antibody Screen: NEGATIVE

## 2021-01-11 LAB — GLUCOSE, CAPILLARY
Glucose-Capillary: 136 mg/dL — ABNORMAL HIGH (ref 70–99)
Glucose-Capillary: 159 mg/dL — ABNORMAL HIGH (ref 70–99)
Glucose-Capillary: 166 mg/dL — ABNORMAL HIGH (ref 70–99)

## 2021-01-11 LAB — ABO/RH: ABO/RH(D): B POS

## 2021-01-11 SURGERY — COLECTOMY, PARTIAL, ROBOT-ASSISTED, LAPAROSCOPIC
Anesthesia: General | Site: Abdomen | Laterality: Right

## 2021-01-11 MED ORDER — PROPOFOL 10 MG/ML IV BOLUS
INTRAVENOUS | Status: AC
  Filled 2021-01-11: qty 20

## 2021-01-11 MED ORDER — DIPHENHYDRAMINE HCL 50 MG/ML IJ SOLN: 12.5000 mg | Freq: Four times a day (QID) | INTRAMUSCULAR | Status: DC | PRN

## 2021-01-11 MED ORDER — LACTATED RINGERS IV BOLUS: 1000.0000 mL | Freq: Three times a day (TID) | INTRAVENOUS | Status: AC | PRN

## 2021-01-11 MED ORDER — SIMETHICONE 80 MG PO CHEW
40.0000 mg | CHEWABLE_TABLET | Freq: Four times a day (QID) | ORAL | Status: DC | PRN
  Administered 2021-01-12 – 2021-01-13 (×2): 40 mg via ORAL
  Filled 2021-01-11 (×3): qty 1

## 2021-01-11 MED ORDER — FENTANYL CITRATE (PF) 250 MCG/5ML IJ SOLN
INTRAMUSCULAR | Status: DC | PRN
  Administered 2021-01-11: 100 ug via INTRAVENOUS
  Administered 2021-01-11: 50 ug via INTRAVENOUS
  Administered 2021-01-11: 100 ug via INTRAVENOUS

## 2021-01-11 MED ORDER — ENOXAPARIN SODIUM 40 MG/0.4ML IJ SOSY
40.0000 mg | PREFILLED_SYRINGE | Freq: Once | INTRAMUSCULAR | Status: AC
  Administered 2021-01-11: 40 mg via SUBCUTANEOUS
  Filled 2021-01-11: qty 0.4

## 2021-01-11 MED ORDER — PHENYLEPHRINE 40 MCG/ML (10ML) SYRINGE FOR IV PUSH (FOR BLOOD PRESSURE SUPPORT)
PREFILLED_SYRINGE | INTRAVENOUS | Status: AC
  Filled 2021-01-11: qty 10

## 2021-01-11 MED ORDER — GLUCERNA SHAKE PO LIQD
237.0000 mL | Freq: Two times a day (BID) | ORAL | Status: DC
  Administered 2021-01-12 – 2021-01-15 (×8): 237 mL via ORAL
  Filled 2021-01-11 (×12): qty 237

## 2021-01-11 MED ORDER — INSULIN ASPART 100 UNIT/ML IJ SOLN
0.0000 [IU] | Freq: Three times a day (TID) | INTRAMUSCULAR | Status: DC
  Administered 2021-01-12 (×2): 4 [IU] via SUBCUTANEOUS
  Administered 2021-01-12 – 2021-01-15 (×4): 3 [IU] via SUBCUTANEOUS

## 2021-01-11 MED ORDER — SODIUM CHLORIDE 0.9 % IR SOLN
Status: DC | PRN
  Administered 2021-01-11: 1000 mL

## 2021-01-11 MED ORDER — ORAL CARE MOUTH RINSE: 15.0000 mL | Freq: Once | OROMUCOSAL | Status: AC

## 2021-01-11 MED ORDER — ASPIRIN EC 81 MG PO TBEC
81.0000 mg | DELAYED_RELEASE_TABLET | Freq: Every day | ORAL | Status: DC
  Administered 2021-01-12: 81 mg via ORAL
  Filled 2021-01-11: qty 1

## 2021-01-11 MED ORDER — TRAMADOL HCL 50 MG PO TABS: 50.0000 mg | ORAL_TABLET | Freq: Four times a day (QID) | ORAL | 0 refills | Status: DC | PRN

## 2021-01-11 MED ORDER — LIDOCAINE 2% (20 MG/ML) 5 ML SYRINGE
INTRAMUSCULAR | Status: AC
  Filled 2021-01-11: qty 15

## 2021-01-11 MED ORDER — PHENOL 1.4 % MT LIQD
2.0000 | OROMUCOSAL | Status: DC | PRN
Start: 2021-01-11 — End: 2021-01-17

## 2021-01-11 MED ORDER — CHLORHEXIDINE GLUCONATE 0.12 % MT SOLN
15.0000 mL | Freq: Once | OROMUCOSAL | Status: AC
  Administered 2021-01-11: 15 mL via OROMUCOSAL

## 2021-01-11 MED ORDER — LIDOCAINE 2% (20 MG/ML) 5 ML SYRINGE
INTRAMUSCULAR | Status: AC
  Filled 2021-01-11: qty 5

## 2021-01-11 MED ORDER — BUPIVACAINE LIPOSOME 1.3 % IJ SUSP
20.0000 mL | Freq: Once | INTRAMUSCULAR | Status: AC
  Administered 2021-01-11: 20 mL
  Filled 2021-01-11: qty 20

## 2021-01-11 MED ORDER — ACETAMINOPHEN 500 MG PO TABS
1000.0000 mg | ORAL_TABLET | ORAL | Status: AC
  Administered 2021-01-11: 1000 mg via ORAL
  Filled 2021-01-11: qty 2

## 2021-01-11 MED ORDER — POLYETHYLENE GLYCOL 3350 17 GM/SCOOP PO POWD: 1.0000 | Freq: Once | ORAL | Status: DC

## 2021-01-11 MED ORDER — KETAMINE HCL 10 MG/ML IJ SOLN
INTRAMUSCULAR | Status: AC
  Filled 2021-01-11: qty 1

## 2021-01-11 MED ORDER — LACTATED RINGERS IV SOLN: INTRAVENOUS | Status: DC

## 2021-01-11 MED ORDER — PROCHLORPERAZINE EDISYLATE 10 MG/2ML IJ SOLN: 5.0000 mg | Freq: Four times a day (QID) | INTRAMUSCULAR | Status: DC | PRN

## 2021-01-11 MED ORDER — METHOCARBAMOL 500 MG PO TABS
1000.0000 mg | ORAL_TABLET | Freq: Four times a day (QID) | ORAL | Status: DC | PRN
  Administered 2021-01-13 – 2021-01-14 (×2): 1000 mg via ORAL
  Filled 2021-01-11 (×2): qty 2

## 2021-01-11 MED ORDER — SODIUM CHLORIDE 0.9 % IV SOLN
2.0000 g | Freq: Two times a day (BID) | INTRAVENOUS | Status: AC
  Administered 2021-01-12: 2 g via INTRAVENOUS
  Filled 2021-01-11 (×2): qty 2

## 2021-01-11 MED ORDER — METOPROLOL TARTRATE 5 MG/5ML IV SOLN
5.0000 mg | Freq: Four times a day (QID) | INTRAVENOUS | Status: DC | PRN
  Administered 2021-01-12: 5 mg via INTRAVENOUS
  Filled 2021-01-11: qty 5

## 2021-01-11 MED ORDER — LABETALOL HCL 5 MG/ML IV SOLN
5.0000 mg | Freq: Once | INTRAVENOUS | Status: AC
  Administered 2021-01-11: 5 mg via INTRAVENOUS

## 2021-01-11 MED ORDER — HYDROMORPHONE HCL 1 MG/ML IJ SOLN
INTRAMUSCULAR | Status: DC | PRN
  Administered 2021-01-11 (×2): 1 mg via INTRAVENOUS

## 2021-01-11 MED ORDER — ENOXAPARIN SODIUM 40 MG/0.4ML IJ SOSY
40.0000 mg | PREFILLED_SYRINGE | INTRAMUSCULAR | Status: DC
  Administered 2021-01-12 – 2021-01-16 (×5): 40 mg via SUBCUTANEOUS
  Filled 2021-01-11 (×5): qty 0.4

## 2021-01-11 MED ORDER — DOXEPIN HCL 50 MG PO CAPS
50.0000 mg | ORAL_CAPSULE | Freq: Every day | ORAL | Status: DC
  Administered 2021-01-11 – 2021-01-16 (×6): 50 mg via ORAL
  Filled 2021-01-11 (×6): qty 1

## 2021-01-11 MED ORDER — CALCIUM POLYCARBOPHIL 625 MG PO TABS
625.0000 mg | ORAL_TABLET | Freq: Two times a day (BID) | ORAL | Status: DC
  Administered 2021-01-11 – 2021-01-17 (×12): 625 mg via ORAL
  Filled 2021-01-11 (×12): qty 1

## 2021-01-11 MED ORDER — ATORVASTATIN CALCIUM 20 MG PO TABS
20.0000 mg | ORAL_TABLET | Freq: Every day | ORAL | Status: DC
  Administered 2021-01-12 – 2021-01-17 (×6): 20 mg via ORAL
  Filled 2021-01-11 (×2): qty 1
  Filled 2021-01-11: qty 2
  Filled 2021-01-11 (×3): qty 1

## 2021-01-11 MED ORDER — ACETAMINOPHEN 10 MG/ML IV SOLN: 1000.0000 mg | Freq: Once | INTRAVENOUS | Status: DC | PRN

## 2021-01-11 MED ORDER — ONDANSETRON HCL 4 MG/2ML IJ SOLN
INTRAMUSCULAR | Status: AC
  Filled 2021-01-11: qty 2

## 2021-01-11 MED ORDER — ROCURONIUM BROMIDE 10 MG/ML (PF) SYRINGE
PREFILLED_SYRINGE | INTRAVENOUS | Status: DC | PRN
  Administered 2021-01-11: 10 mg via INTRAVENOUS
  Administered 2021-01-11: 40 mg via INTRAVENOUS
  Administered 2021-01-11: 80 mg via INTRAVENOUS

## 2021-01-11 MED ORDER — SODIUM CHLORIDE 0.9 % IV SOLN: Freq: Three times a day (TID) | INTRAVENOUS | Status: DC | PRN

## 2021-01-11 MED ORDER — ONDANSETRON HCL 4 MG/2ML IJ SOLN: 4.0000 mg | Freq: Once | INTRAMUSCULAR | Status: DC | PRN

## 2021-01-11 MED ORDER — TRAMADOL HCL 50 MG PO TABS
50.0000 mg | ORAL_TABLET | Freq: Four times a day (QID) | ORAL | Status: DC | PRN
Start: 2021-01-11 — End: 2021-01-17
  Administered 2021-01-12 – 2021-01-13 (×2): 100 mg via ORAL
  Filled 2021-01-11 (×3): qty 2

## 2021-01-11 MED ORDER — BISACODYL 5 MG PO TBEC: 20.0000 mg | DELAYED_RELEASE_TABLET | Freq: Once | ORAL | Status: DC

## 2021-01-11 MED ORDER — ONDANSETRON HCL 4 MG PO TABS: 4.0000 mg | ORAL_TABLET | Freq: Four times a day (QID) | ORAL | Status: DC | PRN

## 2021-01-11 MED ORDER — DEXAMETHASONE SODIUM PHOSPHATE 10 MG/ML IJ SOLN
INTRAMUSCULAR | Status: AC
  Filled 2021-01-11: qty 1

## 2021-01-11 MED ORDER — ALVIMOPAN 12 MG PO CAPS
12.0000 mg | ORAL_CAPSULE | ORAL | Status: AC
  Administered 2021-01-11: 12 mg via ORAL
  Filled 2021-01-11: qty 1

## 2021-01-11 MED ORDER — LIDOCAINE 2% (20 MG/ML) 5 ML SYRINGE
INTRAMUSCULAR | Status: DC | PRN
  Administered 2021-01-11: 100 mg via INTRAVENOUS

## 2021-01-11 MED ORDER — MAGIC MOUTHWASH
15.0000 mL | Freq: Four times a day (QID) | ORAL | Status: DC | PRN
  Filled 2021-01-11: qty 15

## 2021-01-11 MED ORDER — PHENYLEPHRINE HCL-NACL 10-0.9 MG/250ML-% IV SOLN
INTRAVENOUS | Status: DC | PRN
  Administered 2021-01-11: 15 ug/min via INTRAVENOUS

## 2021-01-11 MED ORDER — DEXAMETHASONE SODIUM PHOSPHATE 10 MG/ML IJ SOLN
INTRAMUSCULAR | Status: DC | PRN
  Administered 2021-01-11: 5 mg via INTRAVENOUS

## 2021-01-11 MED ORDER — HYDROMORPHONE HCL 1 MG/ML IJ SOLN
0.5000 mg | INTRAMUSCULAR | Status: DC | PRN
  Administered 2021-01-13: 1 mg via INTRAVENOUS
  Filled 2021-01-11: qty 1

## 2021-01-11 MED ORDER — LABETALOL HCL 5 MG/ML IV SOLN
INTRAVENOUS | Status: DC | PRN
  Administered 2021-01-11: 5 mg via INTRAVENOUS

## 2021-01-11 MED ORDER — ENALAPRILAT 1.25 MG/ML IV SOLN
0.6250 mg | Freq: Four times a day (QID) | INTRAVENOUS | Status: DC | PRN
  Filled 2021-01-11: qty 1

## 2021-01-11 MED ORDER — HYDROMORPHONE HCL 1 MG/ML IJ SOLN: 0.2500 mg | INTRAMUSCULAR | Status: DC | PRN

## 2021-01-11 MED ORDER — LABETALOL HCL 5 MG/ML IV SOLN
INTRAVENOUS | Status: AC
  Filled 2021-01-11: qty 4

## 2021-01-11 MED ORDER — ACETAMINOPHEN 500 MG PO TABS
1000.0000 mg | ORAL_TABLET | Freq: Four times a day (QID) | ORAL | Status: DC
  Administered 2021-01-11 – 2021-01-17 (×20): 1000 mg via ORAL
  Filled 2021-01-11 (×20): qty 2

## 2021-01-11 MED ORDER — BUPIVACAINE-EPINEPHRINE (PF) 0.25% -1:200000 IJ SOLN
INTRAMUSCULAR | Status: DC | PRN
  Administered 2021-01-11: 60 mL

## 2021-01-11 MED ORDER — NEOMYCIN SULFATE 500 MG PO TABS: 1000.0000 mg | ORAL_TABLET | ORAL | Status: DC

## 2021-01-11 MED ORDER — PHENYLEPHRINE HCL (PRESSORS) 10 MG/ML IV SOLN
INTRAVENOUS | Status: DC | PRN
  Administered 2021-01-11: 80 ug via INTRAVENOUS

## 2021-01-11 MED ORDER — MIDAZOLAM HCL 2 MG/2ML IJ SOLN
INTRAMUSCULAR | Status: DC | PRN
  Administered 2021-01-11: 2 mg via INTRAVENOUS

## 2021-01-11 MED ORDER — 0.9 % SODIUM CHLORIDE (POUR BTL) OPTIME
TOPICAL | Status: DC | PRN
  Administered 2021-01-11: 1000 mL

## 2021-01-11 MED ORDER — MIDAZOLAM HCL 2 MG/2ML IJ SOLN
INTRAMUSCULAR | Status: AC
  Filled 2021-01-11: qty 2

## 2021-01-11 MED ORDER — HYDROMORPHONE HCL 2 MG/ML IJ SOLN
INTRAMUSCULAR | Status: AC
  Filled 2021-01-11: qty 1

## 2021-01-11 MED ORDER — GABAPENTIN 300 MG PO CAPS
300.0000 mg | ORAL_CAPSULE | ORAL | Status: AC
  Administered 2021-01-11: 300 mg via ORAL
  Filled 2021-01-11: qty 1

## 2021-01-11 MED ORDER — FENTANYL CITRATE (PF) 250 MCG/5ML IJ SOLN
INTRAMUSCULAR | Status: AC
  Filled 2021-01-11: qty 5

## 2021-01-11 MED ORDER — SUGAMMADEX SODIUM 500 MG/5ML IV SOLN
INTRAVENOUS | Status: DC | PRN
  Administered 2021-01-11: 300 mg via INTRAVENOUS

## 2021-01-11 MED ORDER — ONDANSETRON HCL 4 MG/2ML IJ SOLN
INTRAMUSCULAR | Status: DC | PRN
  Administered 2021-01-11: 4 mg via INTRAVENOUS

## 2021-01-11 MED ORDER — ALVIMOPAN 12 MG PO CAPS
12.0000 mg | ORAL_CAPSULE | Freq: Two times a day (BID) | ORAL | Status: DC
  Administered 2021-01-12 – 2021-01-14 (×6): 12 mg via ORAL
  Filled 2021-01-11 (×7): qty 1

## 2021-01-11 MED ORDER — METHOCARBAMOL 1000 MG/10ML IJ SOLN
1000.0000 mg | Freq: Four times a day (QID) | INTRAVENOUS | Status: DC | PRN
  Filled 2021-01-11: qty 10

## 2021-01-11 MED ORDER — ONDANSETRON HCL 4 MG/2ML IJ SOLN
4.0000 mg | Freq: Four times a day (QID) | INTRAMUSCULAR | Status: DC | PRN
Start: 2021-01-11 — End: 2021-01-17

## 2021-01-11 MED ORDER — SUGAMMADEX SODIUM 500 MG/5ML IV SOLN
INTRAVENOUS | Status: AC
  Filled 2021-01-11: qty 5

## 2021-01-11 MED ORDER — LIDOCAINE HCL (PF) 2 % IJ SOLN
INTRAMUSCULAR | Status: DC | PRN
  Administered 2021-01-11: 1.5 mg/kg/h via INTRADERMAL

## 2021-01-11 MED ORDER — PANTOPRAZOLE SODIUM 40 MG PO TBEC
40.0000 mg | DELAYED_RELEASE_TABLET | Freq: Every day | ORAL | Status: DC
  Administered 2021-01-12 – 2021-01-17 (×6): 40 mg via ORAL
  Filled 2021-01-11 (×6): qty 1

## 2021-01-11 MED ORDER — ALUM & MAG HYDROXIDE-SIMETH 200-200-20 MG/5ML PO SUSP
30.0000 mL | Freq: Four times a day (QID) | ORAL | Status: DC | PRN
  Administered 2021-01-13 – 2021-01-15 (×2): 30 mL via ORAL
  Filled 2021-01-11 (×2): qty 30

## 2021-01-11 MED ORDER — METRONIDAZOLE 500 MG PO TABS: 1000.0000 mg | ORAL_TABLET | ORAL | Status: DC

## 2021-01-11 MED ORDER — MENTHOL 3 MG MT LOZG: 1.0000 | LOZENGE | OROMUCOSAL | Status: DC | PRN

## 2021-01-11 MED ORDER — AMLODIPINE BESYLATE 5 MG PO TABS
5.0000 mg | ORAL_TABLET | Freq: Every day | ORAL | Status: DC
  Administered 2021-01-12 – 2021-01-17 (×6): 5 mg via ORAL
  Filled 2021-01-11 (×6): qty 1

## 2021-01-11 MED ORDER — PROCHLORPERAZINE MALEATE 10 MG PO TABS
10.0000 mg | ORAL_TABLET | Freq: Four times a day (QID) | ORAL | Status: DC | PRN
  Filled 2021-01-11: qty 1

## 2021-01-11 MED ORDER — DIPHENHYDRAMINE HCL 12.5 MG/5ML PO ELIX: 12.5000 mg | ORAL_SOLUTION | Freq: Four times a day (QID) | ORAL | Status: DC | PRN

## 2021-01-11 MED ORDER — LIP MEDEX EX OINT
1.0000 "application " | TOPICAL_OINTMENT | Freq: Two times a day (BID) | CUTANEOUS | Status: DC
  Administered 2021-01-11 – 2021-01-17 (×11): 1 via TOPICAL
  Filled 2021-01-11 (×3): qty 7

## 2021-01-11 MED ORDER — PROPOFOL 10 MG/ML IV BOLUS
INTRAVENOUS | Status: DC | PRN
  Administered 2021-01-11: 150 mg via INTRAVENOUS

## 2021-01-11 MED ORDER — MELATONIN 3 MG PO TABS: 3.0000 mg | ORAL_TABLET | Freq: Every evening | ORAL | Status: DC | PRN

## 2021-01-11 MED ORDER — INSULIN ASPART 100 UNIT/ML IJ SOLN: 0.0000 [IU] | Freq: Every day | INTRAMUSCULAR | Status: DC

## 2021-01-11 MED ORDER — BUPIVACAINE-EPINEPHRINE (PF) 0.25% -1:200000 IJ SOLN
INTRAMUSCULAR | Status: AC
  Filled 2021-01-11: qty 60

## 2021-01-11 MED ORDER — ROCURONIUM BROMIDE 10 MG/ML (PF) SYRINGE
PREFILLED_SYRINGE | INTRAVENOUS | Status: AC
  Filled 2021-01-11: qty 10

## 2021-01-11 MED ORDER — PHENYLEPHRINE HCL (PRESSORS) 10 MG/ML IV SOLN
INTRAVENOUS | Status: AC
  Filled 2021-01-11: qty 1

## 2021-01-11 MED ORDER — INSULIN DETEMIR 100 UNIT/ML ~~LOC~~ SOLN
30.0000 [IU] | Freq: Two times a day (BID) | SUBCUTANEOUS | Status: DC
  Administered 2021-01-11 – 2021-01-17 (×12): 30 [IU] via SUBCUTANEOUS
  Filled 2021-01-11 (×12): qty 0.3

## 2021-01-11 SURGICAL SUPPLY — 108 items
APPLIER CLIP 5 13 M/L LIGAMAX5 (MISCELLANEOUS)
APPLIER CLIP ROT 10 11.4 M/L (STAPLE)
BAG COUNTER SPONGE SURGICOUNT (BAG) ×2 IMPLANT
BLADE EXTENDED COATED 6.5IN (ELECTRODE) IMPLANT
CANNULA REDUC XI 12-8 STAPL (CANNULA)
CANNULA REDUCER 12-8 DVNC XI (CANNULA) IMPLANT
CELLS DAT CNTRL 66122 CELL SVR (MISCELLANEOUS) IMPLANT
CHLORAPREP W/TINT 26 (MISCELLANEOUS) IMPLANT
CLIP APPLIE 5 13 M/L LIGAMAX5 (MISCELLANEOUS) IMPLANT
CLIP APPLIE ROT 10 11.4 M/L (STAPLE) IMPLANT
COVER SURGICAL LIGHT HANDLE (MISCELLANEOUS) ×4 IMPLANT
COVER TIP SHEARS 8 DVNC (MISCELLANEOUS) ×1 IMPLANT
COVER TIP SHEARS 8MM DA VINCI (MISCELLANEOUS) ×1
DECANTER SPIKE VIAL GLASS SM (MISCELLANEOUS) ×2 IMPLANT
DEVICE TROCAR PUNCTURE CLOSURE (ENDOMECHANICALS) IMPLANT
DRAIN CHANNEL 19F RND (DRAIN) IMPLANT
DRAPE ARM DVNC X/XI (DISPOSABLE) ×4 IMPLANT
DRAPE COLUMN DVNC XI (DISPOSABLE) ×1 IMPLANT
DRAPE DA VINCI XI ARM (DISPOSABLE) ×4
DRAPE DA VINCI XI COLUMN (DISPOSABLE) ×1
DRAPE SURG IRRIG POUCH 19X23 (DRAPES) ×2 IMPLANT
DRSG OPSITE POSTOP 4X10 (GAUZE/BANDAGES/DRESSINGS) IMPLANT
DRSG OPSITE POSTOP 4X6 (GAUZE/BANDAGES/DRESSINGS) IMPLANT
DRSG OPSITE POSTOP 4X8 (GAUZE/BANDAGES/DRESSINGS) ×2 IMPLANT
DRSG TEGADERM 2-3/8X2-3/4 SM (GAUZE/BANDAGES/DRESSINGS) ×8 IMPLANT
DRSG TEGADERM 4X4.75 (GAUZE/BANDAGES/DRESSINGS) IMPLANT
ELECT PENCIL ROCKER SW 15FT (MISCELLANEOUS) ×2 IMPLANT
ELECT REM PT RETURN 15FT ADLT (MISCELLANEOUS) ×2 IMPLANT
ENDOLOOP SUT PDS II  0 18 (SUTURE)
ENDOLOOP SUT PDS II 0 18 (SUTURE) IMPLANT
EVACUATOR SILICONE 100CC (DRAIN) IMPLANT
GAUZE SPONGE 2X2 8PLY STRL LF (GAUZE/BANDAGES/DRESSINGS) ×1 IMPLANT
GLOVE SURG LTX SZ8 (GLOVE) ×6 IMPLANT
GLOVE SURG UNDER LTX SZ8 (GLOVE) ×6 IMPLANT
GOWN STRL REUS W/TWL XL LVL3 (GOWN DISPOSABLE) ×6 IMPLANT
GRASPER SUT TROCAR 14GX15 (MISCELLANEOUS) IMPLANT
HOLDER FOLEY CATH W/STRAP (MISCELLANEOUS) ×2 IMPLANT
IRRIG SUCT STRYKERFLOW 2 WTIP (MISCELLANEOUS) ×2
IRRIGATION SUCT STRKRFLW 2 WTP (MISCELLANEOUS) ×1 IMPLANT
KIT PROCEDURE DA VINCI SI (MISCELLANEOUS)
KIT PROCEDURE DVNC SI (MISCELLANEOUS) IMPLANT
KIT SIGMOIDOSCOPE (SET/KITS/TRAYS/PACK) IMPLANT
KIT TURNOVER KIT A (KITS) ×2 IMPLANT
NEEDLE INSUFFLATION 14GA 120MM (NEEDLE) ×2 IMPLANT
PACK CARDIOVASCULAR III (CUSTOM PROCEDURE TRAY) ×2 IMPLANT
PACK COLON (CUSTOM PROCEDURE TRAY) ×2 IMPLANT
PAD POSITIONING PINK XL (MISCELLANEOUS) ×2 IMPLANT
PORT LAP GEL ALEXIS MED 5-9CM (MISCELLANEOUS) ×2 IMPLANT
PROTECTOR NERVE ULNAR (MISCELLANEOUS) ×4 IMPLANT
RELOAD STAPLER 2.5X60 WHT DVNC (STAPLE) ×1 IMPLANT
RELOAD STAPLER 3.5X45 BLU DVNC (STAPLE) IMPLANT
RELOAD STAPLER 3.5X60 BLU DVNC (STAPLE) ×2 IMPLANT
RELOAD STAPLER 4.3X45 GRN DVNC (STAPLE) IMPLANT
RELOAD STAPLER 4.3X60 GRN DVNC (STAPLE) IMPLANT
RTRCTR WOUND ALEXIS 18CM MED (MISCELLANEOUS)
SCISSORS LAP 5X35 DISP (ENDOMECHANICALS) ×2 IMPLANT
SEAL CANN UNIV 5-8 DVNC XI (MISCELLANEOUS) ×3 IMPLANT
SEAL XI 5MM-8MM UNIVERSAL (MISCELLANEOUS) ×3
SEALER VESSEL DA VINCI XI (MISCELLANEOUS) ×2
SEALER VESSEL EXT DVNC XI (MISCELLANEOUS) ×2 IMPLANT
SOLUTION ELECTROLUBE (MISCELLANEOUS) ×2 IMPLANT
SPONGE GAUZE 2X2 8PLY STRL LF (GAUZE/BANDAGES/DRESSINGS) ×2 IMPLANT
SPONGE GAUZE 2X2 STER 10/PKG (GAUZE/BANDAGES/DRESSINGS) ×1
STAPLER 45 DA VINCI SURE FORM (STAPLE)
STAPLER 45 SUREFORM DVNC (STAPLE) IMPLANT
STAPLER 60 DA VINCI SURE FORM (STAPLE) ×1
STAPLER 60 SUREFORM DVNC (STAPLE) ×1 IMPLANT
STAPLER CANNULA SEAL DVNC XI (STAPLE) ×1 IMPLANT
STAPLER CANNULA SEAL XI (STAPLE) ×1
STAPLER ECHELON POWER CIR 29 (STAPLE) IMPLANT
STAPLER ECHELON POWER CIR 31 (STAPLE) IMPLANT
STAPLER RELOAD 2.5X60 WHITE (STAPLE) ×1
STAPLER RELOAD 2.5X60 WHT DVNC (STAPLE) ×1
STAPLER RELOAD 3.5X45 BLU DVNC (STAPLE)
STAPLER RELOAD 3.5X45 BLUE (STAPLE)
STAPLER RELOAD 3.5X60 BLU DVNC (STAPLE) ×2
STAPLER RELOAD 3.5X60 BLUE (STAPLE) ×2
STAPLER RELOAD 4.3X45 GREEN (STAPLE)
STAPLER RELOAD 4.3X45 GRN DVNC (STAPLE)
STAPLER RELOAD 4.3X60 GREEN (STAPLE)
STAPLER RELOAD 4.3X60 GRN DVNC (STAPLE)
STOPCOCK 4 WAY LG BORE MALE ST (IV SETS) ×4 IMPLANT
SURGILUBE 2OZ TUBE FLIPTOP (MISCELLANEOUS) IMPLANT
SUT MNCRL AB 4-0 PS2 18 (SUTURE) ×2 IMPLANT
SUT PDS AB 1 CT1 27 (SUTURE) ×4 IMPLANT
SUT PROLENE 0 CT 2 (SUTURE) IMPLANT
SUT PROLENE 2 0 KS (SUTURE) IMPLANT
SUT PROLENE 2 0 SH DA (SUTURE) IMPLANT
SUT SILK 2 0 (SUTURE)
SUT SILK 2 0 SH CR/8 (SUTURE) IMPLANT
SUT SILK 2-0 18XBRD TIE 12 (SUTURE) IMPLANT
SUT SILK 3 0 (SUTURE)
SUT SILK 3 0 SH CR/8 (SUTURE) ×2 IMPLANT
SUT SILK 3-0 18XBRD TIE 12 (SUTURE) IMPLANT
SUT V-LOC BARB 180 2/0GR6 GS22 (SUTURE) ×4
SUT VIC AB 3-0 SH 18 (SUTURE) IMPLANT
SUT VIC AB 3-0 SH 27 (SUTURE)
SUT VIC AB 3-0 SH 27XBRD (SUTURE) IMPLANT
SUT VICRYL 0 UR6 27IN ABS (SUTURE) ×4 IMPLANT
SUTURE V-LC BRB 180 2/0GR6GS22 (SUTURE) ×2 IMPLANT
SYR 10ML ECCENTRIC (SYRINGE) ×2 IMPLANT
SYS LAPSCP GELPORT 120MM (MISCELLANEOUS)
SYSTEM LAPSCP GELPORT 120MM (MISCELLANEOUS) IMPLANT
TOWEL OR NON WOVEN STRL DISP B (DISPOSABLE) ×2 IMPLANT
TRAY FOLEY MTR SLVR 16FR STAT (SET/KITS/TRAYS/PACK) ×2 IMPLANT
TROCAR ADV FIXATION 5X100MM (TROCAR) ×2 IMPLANT
TUBING CONNECTING 10 (TUBING) ×4 IMPLANT
TUBING INSUFFLATION 10FT LAP (TUBING) ×2 IMPLANT

## 2021-01-11 NOTE — Transfer of Care (Signed)
Immediate Anesthesia Transfer of Care Note  Patient: Micheal Gomez  Procedure(s) Performed: XI ROBOT ASSISTED COLECTOMY PROXIMAL (Right: Abdomen)  Patient Location: PACU  Anesthesia Type:General  Level of Consciousness: awake, alert , oriented and patient cooperative  Airway & Oxygen Therapy: Patient Spontanous Breathing and Patient connected to face mask oxygen  Post-op Assessment: Report given to RN, Post -op Vital signs reviewed and stable and Patient moving all extremities X 4  Post vital signs: stable  Last Vitals:  Vitals Value Taken Time  BP 166/95 01/11/21 2115  Temp 36.7 C 01/11/21 2103  Pulse 91 01/11/21 2122  Resp 10 01/11/21 2122  SpO2 97 % 01/11/21 2122  Vitals shown include unvalidated device data.  Last Pain:  Vitals:   01/11/21 1206  TempSrc:   PainSc: 8       Patients Stated Pain Goal: 3 (78/24/23 5361)  Complications: No notable events documented.

## 2021-01-11 NOTE — Op Note (Signed)
01/11/2021  8:46 PM  PATIENT:  Micheal Gomez  65 y.o. male  Patient Care Team: Administration, Veterans as PCP - [name], MD as Consulting Physician (Colon and Rectal Surgery) [name], MD as Attending Physician (Gastroenterology)  PRE-OPERATIVE DIAGNOSIS:  colon polyp unresectable by colonscopy  POST-OPERATIVE DIAGNOSIS:  Transverse colon polyp unresectable by colonscopy  PROCEDURE:   XI ROBOT ASSISTED COLECTOMY PROXIMAL OMENTOPEXY TRANSVERSUS ABDOMINIS PLANE (TAP) BLOCK - BILATERAL  SURGEON:  Adin Hector, MD  ASSISTANT: Carlena Hurl, PA-C An experienced assistant was required given the standard of surgical care given the complexity of the case.  This assistant was needed for exposure, dissection, suction, tissue approximation, retraction, perception, etc.     ANESTHESIA:     General  Regional TRANSVERSUS ABDOMINIS PLANE (TAP) nerve block for perioperative & postoperative pain control provided with liposomal bupivacaine (Experel) mixed with 0.25% bupivacaine as a Bilateral TAP block x 76mL each side at the level of the transverse abdominis & preperitoneal spaces along the flank at the anterior axillary line, from subcostal ridge to iliac crest under laparoscopic guidance   Local field block at port sites & extraction wound  EBL:  Total I/O In: -  Out: 150 [Urine:50; Blood:100]  Delay start of Pharmacological VTE agent (>24hrs) due to surgical blood loss or risk of bleeding:  no  DRAINS: none   SPECIMEN:  PROXIMAL "RIGHT" COLON  DISPOSITION OF SPECIMEN:  PATHOLOGY  COUNTS:  YES  PLAN OF CARE: Admit to inpatient   PATIENT DISPOSITION:  PACU - hemodynamically stable.  INDICATION:    Pleasant gentleman with colonoscopy.  Numerous polyps removed.  A more bulky atypical polyp noted in the proximal transverse colon.  Not felt safe to remove endoscopically.  Tattooed.  Surgical consultation requested.  I recommended segmental  resection:  The anatomy & physiology of the digestive tract was discussed.  The pathophysiology was discussed.  Natural history risks without surgery was discussed.   I worked to give an overview of the disease and the frequent need to have multispecialty involvement.  I feel the risks of no intervention will lead to serious problems that outweigh the operative risks; therefore, I recommended a partial colectomy to remove the pathology.  Laparoscopic & open techniques were discussed.   Risks such as bleeding, infection, abscess, leak, reoperation, possible ostomy, hernia, heart attack, death, and other risks were discussed.  I noted a good likelihood this will help address the problem.   Goals of post-operative recovery were discussed as well.  We will work to minimize complications.  An educational handout on the pathology was given as well.  Questions were answered.    The patient expresses understanding & wishes to proceed with surgery.  OR FINDINGS:   CASE DATA:  Type of patient?: Elective [facility] Private Case  Status of Case? Elective Scheduled  Infection Present At Time Of Surgery (PATOS)?  NO  Patient had tattooing in his proximal/mid transverse colon.  Lobulated atypical transverse colon polyp at the tattoo site.  Monitor adhesions from prior appendectomy  No obvious metastatic disease on visceral parietal peritoneum or liver.  It is an isoperistaltic ileocolonic anastomosis that rests in the epigastric region.  DESCRIPTION:   Informed consent was confirmed.  The patient underwent general anaesthesia without difficulty.  The patient was positioned with arms tucked & secured appropriately.  VTE prevention in place.  The patient's abdomen was clipped, prepped, & draped in a sterile fashion.  Surgical timeout confirmed our plan.  The  patient was positioned in reverse Trendelenburg.  Abdominal entry was gained using Varess technique at the subcostal ridge on the anterior abdominal wall.  No  elevated EtCO2 noted.  Port placed.  Camera inspection revealed no injury.  Extra ports were carefully placed under direct laparoscopic visualization.  We docked the [employer] Vinci robot carefully and placed intstruments under visualization  I mobilized & reflected the greater omentum and small bowel in the upper abdomen.  Was able to identify tattooing in the ascending colon but the most dominant area of tattooing was in the junction between the proximal and mid transverse colon.  I freed some adhesions to the anterior abdominal wall of primarily greater omentum as well as mobilize the ileocecal region and ascending colon in a inferior to superior lateral fashion since it was somewhat stretched out and flop down in the pelvis.  This allowed a little more mobility.  With this, I was able to elevate the proximal colon to isolate the ileocolonic pedicle.  I scored the ileal mesentery just proximal to that.   I carried that further dissection in a medial to lateral fashion.  I was able to bluntly get into the retro-mesenteric plane on the right side.  I freed the proximal right sided colonic mesentery off the retroperitoneum including the duodenal sweep, pancreatic head, & Gerota's fascia of the right kidney. I was able to get underneath the hepatic flexure.  I was able to get underneath the proximal and mid transverse colon.  I isolated the proximal ileocecal pedicle.  I skeletonized it & transected the vessels.    I then proceeded to mobilize the terminal ileum & proximal "right" colon in a lateral to medial fashion.  I mobilized the distal ileal mesentery off its retroperitoneal and pelvic attachments.  I mobilized the ascending colon off It is side wall attachments to the paracolic gutter and retroperitoneum.  We then transected and mobilized the greater omentum off the mid transverse colon and mobilized the mid to proximal transverse colon in a superior to inferior fashion.  This allowed me to mobilize the  hepatic flexure and get a complete mobilization of the proximal "right" colon to the mid-transverse colon.  I elevated the mid transverse colon to isolate a middle colic pedicle that was distal to the tattooing more than 5 cm.  I transected the right and transverse mesentery in a radial fashion especially the transverse colon found a window.  And then I freed the intervening mesentery of the hepatic flexure off of the duodenal pancreas retroperitoneal region after having a good critical view around it.  I could isolate the pathology. When he went ahead and proceeded with transection.  I transected the distal ileal mesentery and then transected at the distal ileum with a robotic stapler 16mm blue load.  I then transected transverse colon mesentery just proximal to a dominant middle colic arterial pedicle radially.  Transected at the proximal transverse colon with a robotic stapler.  We assured hemostasis.   I did a side-to-side stapled anastomosis of ileum to mid-transverse colon using a 105mm white load in an isoperistaltic fashion.  (Distal stump of ileum to mid transverse colon for the distal end of the anastomosis.  Proximal end of colon stump to more proximal ileum for the proximal end of the anastomosis).  I sewed the common staple channel wound with an absorbable suture ( 2-0 V-lock) in a running [place] fashion from each corner and meeting in the center.  I did meticulous inspection prove an  airtight closure.  We protected the anastomosis line with an anterior omentopexy of greater omentum using V lock suture.  We did reinspection of the abdomen.  Hemostasis was good.   Ureters, retroperitoneum, and bowel uninjured.  The anastomosis looked healthy.   Endoluminal gas was evacuated.  We placed the wound protector through the suprapubic 36mm port site after it was enlarged in a Pfannenstiel fashion.  Specimen removed without incident.   Ports & wound protector removed.  Hemostasis was good.  Sterile unused  instruments were used from this point.  I closed the skin at the port sites using Monocryl stitch and sterile dressing.  I closed the extraction wound using a 0 Vicryl vertical peritoneal closure and a #1 PDS transverse anterior rectal fascial closure like a small Pfannenstiel closure. I closed the skin with some interrupted Monocryl stitches. I placed antibiotic-soaked wicks into the closure at the corners x2.  I placed sterile dressings.     Patient is being extubated go to recovery room. I discussed postop care with the patient in detail the office & in the holding area. Instructions are written. I discussed operative findings, updated the patient's status, discussed probable steps to recovery, and gave postoperative recommendations to the patient's significant other, Balinda Quails & daughter, Charlena Cross.  Recommendations were made.  Questions were answered.  They expressed understanding & appreciation.   Adin Hector, M.D., F.A.C.S. Gastrointestinal and Minimally Invasive Surgery [facility] [address] [place], [place] [address] [phone] Main / Paging

## 2021-01-11 NOTE — Interval H&P Note (Signed)
History and Physical Interval Note:  01/11/2021 4:38 PM  Micheal Gomez  has presented today for surgery, with the diagnosis of colon polyp unresectable by colonscopy.  The various methods of treatment have been discussed with the patient and family. After consideration of risks, benefits and other options for treatment, the patient has consented to  Procedure(s): XI ROBOT ASSISTED COLECTOMY PROXIMAL (N/A) as a surgical intervention.  The patient's history has been reviewed, patient examined, no change in status, stable for surgery.  I have reviewed the patient's chart and labs.  Questions were answered to the patient's satisfaction.    I have re-reviewed the the patient's records, history, medications, and allergies.  I have re-examined the patient.  I again discussed intraoperative plans and goals of post-operative recovery.  The patient agrees to proceed.  Ghazi Rumpf  1956-02-24 [id number]  Patient Care Team: Administration, Veterans as PCP - [name], MD as Consulting Physician (Colon and Rectal Surgery) [name], MD as Attending Physician (Gastroenterology)  Patient Active Problem List   Diagnosis Date Noted   Shoulder subluxation, left 12/16/2020   Transaminitis    Hyperglycemia    End-stage renal disease on hemodialysis ([place])    Non-traumatic rhabdomyolysis    OSA on CPAP    Obesity hypoventilation syndrome ([place])    Acute encephalopathy    Acute renal failure ([place])    Diabetic ketoacidosis with coma associated with type 2 diabetes mellitus ([place])    DKA (diabetic ketoacidoses) 08/05/2015   Atypical chest pain 08/26/2013    Past Medical History:  Diagnosis Date   COVID-19    11-29-20   Diabetes mellitus without complication (HCC)    High cholesterol    History of stomach ulcers    Hypertension     Past Surgical History:  Procedure Laterality Date   APPENDECTOMY     polyp removal     TONSILLECTOMY      Social History   Socioeconomic  History   Marital status: Single    Spouse name: Not on file   Number of children: Not on file   Years of education: Not on file   Highest education level: Not on file  Occupational History   Not on file  Tobacco Use   Smoking status: Former    Pack years: 0.00    Types: Cigarettes   Smokeless tobacco: Never  Vaping Use   Vaping Use: Never used  Substance and Sexual Activity   Alcohol use: No   Drug use: No   Sexual activity: Not on file  Other Topics Concern   Not on file  Social History Narrative   [facility]   Retired from [employer]   Social Determinants of Radio broadcast assistant Strain: Not on file  Food Insecurity: Not on file  Transportation Needs: Not on file  Physical Activity: Not on file  Stress: Not on file  Social Connections: Not on file  Intimate Partner Violence: Not on file    Family History  Problem Relation Age of Onset   Diabetes Father    Hypertension Father    Colon cancer Neg Hx    Stomach cancer Neg Hx    Rectal cancer Neg Hx    Esophageal cancer Neg Hx     Medications Prior to Admission  Medication Sig Dispense Refill Last Dose   aspirin EC 81 MG tablet Take 81 mg by mouth daily. Swallow whole.   01/06/2021   atorvastatin (LIPITOR) 20 MG tablet Take  20 mg by mouth daily.   09/02/3733   bismuth subsalicylate (PEPTO BISMOL) 262 MG/15ML suspension Take 30 mLs by mouth every 6 (six) hours as needed for diarrhea or loose stools or indigestion.   01/06/2021   doxepin (SINEQUAN) 50 MG capsule Take 50 mg by mouth at bedtime.   01/06/2021   hydrochlorothiazide (HYDRODIURIL) 25 MG tablet Take 25 mg by mouth daily.   01/06/2021   insulin aspart (NOVOLOG) 100 UNIT/ML injection Inject 30 Units into the skin 2 (two) times daily before a meal.   01/11/2021 at 0600   metFORMIN (GLUCOPHAGE-XR) 500 MG 24 hr tablet Take 1,000 mg by mouth 2 (two) times daily.   01/10/2021   omeprazole (PRILOSEC) 20 MG capsule Take 20 mg by mouth 2 (two) times daily.    01/10/2021   OVER THE COUNTER MEDICATION Take 2 tablets by mouth daily.   01/10/2021   Semaglutide, 1 MG/DOSE, (OZEMPIC, 1 MG/DOSE,) 2 MG/1.5ML SOPN Inject 1 mg into the skin every Saturday.   01/07/2021   amLODipine (NORVASC) 5 MG tablet Take 1 tablet (5 mg total) by mouth daily. 30 tablet 0    blood glucose meter kit and supplies Dispense based on patient and insurance preference. Use up to four times daily as directed. (FOR ICD-9 250.00, 250.01). 1 each 0    glucose blood (FREESTYLE LITE) test strip Use as instructed 100 each 12    insulin detemir (LEVEMIR) 100 UNIT/ML injection Inject 0.16 mLs (16 Units total) into the skin daily. (Patient taking differently: Inject 30 Units into the skin 2 (two) times daily.) 10 mL 0    insulin starter kit- pen needles MISC 1 kit by Other route once. 1 each 1    INSULIN SYRINGE .5CC/28G 28G X 1/2" 0.5 ML MISC 1 application by Does not apply route daily. 100 each 0     Current Facility-Administered Medications  Medication Dose Route Frequency Provider Last Rate Last Admin   cefoTEtan (CEFOTAN) 2 g in sodium chloride 0.9 % 100 mL IVPB  2 g Intravenous On Call to OR [name], MD       lactated ringers infusion   Intravenous Continuous Hatchett, Mateo Flow, MD         No Known Allergies  BP (!) 159/91   Pulse (!) 102   Temp 98.1 F (36.7 C) (Oral)   Resp 18   Ht $R'5\' 9"'sg$  (1.753 m)   Wt 118.5 kg   SpO2 97%   BMI 38.57 kg/m   Labs: Results for orders placed or performed during the hospital encounter of 01/11/21 (from the past 48 hour(s))  Glucose, capillary     Status: Abnormal   Collection Time: 01/11/21 11:53 AM  Result Value Ref Range   Glucose-Capillary 136 (H) 70 - 99 mg/dL    Comment: Glucose reference range applies only to samples taken after fasting for at least 8 hours.  ABO/Rh     Status: None   Collection Time: 01/11/21 12:05 PM  Result Value Ref Range   ABO/RH(D)      B POS Performed at [facility], [place] [address] [place], [place] [address]     Imaging / Studies: No results found.   Adin Hector, M.D., F.A.C.S. Gastrointestinal and Minimally Invasive Surgery [facility] [address], [place], [place] [address] [phone] Main / Paging  01/11/2021 4:38 PM    Adin Hector

## 2021-01-11 NOTE — Anesthesia Preprocedure Evaluation (Addendum)
Anesthesia Evaluation  Patient identified by MRN, date of birth, ID band Patient awake    Reviewed: Allergy & Precautions, NPO status , Patient's Chart, lab work & pertinent test results  Airway Mallampati: II  TM Distance: <3 FB Neck ROM: Full    Dental no notable dental hx.    Pulmonary neg pulmonary ROS, former smoker,    Pulmonary exam normal breath sounds clear to auscultation       Cardiovascular hypertension, Pt. on medications  Rhythm:Regular Rate:Tachycardia     Neuro/Psych negative neurological ROS  negative psych ROS   GI/Hepatic negative GI ROS, Neg liver ROS,   Endo/Other  diabetes, Insulin DependentMorbid obesity  Renal/GU   negative genitourinary   Musculoskeletal negative musculoskeletal ROS (+)   Abdominal   Peds negative pediatric ROS (+)  Hematology negative hematology ROS (+)   Anesthesia Other Findings   Reproductive/Obstetrics negative OB ROS                           Anesthesia Physical Anesthesia Plan  ASA: 3  Anesthesia Plan: General   Post-op Pain Management:    Induction: Intravenous  PONV Risk Score and Plan: 2 and Ondansetron, Dexamethasone and Treatment may vary due to age or medical condition  Airway Management Planned: Oral ETT  Additional Equipment:   Intra-op Plan:   Post-operative Plan: Extubation in OR  Informed Consent: I have reviewed the patients History and Physical, chart, labs and discussed the procedure including the risks, benefits and alternatives for the proposed anesthesia with the patient or authorized representative who has indicated his/her understanding and acceptance.     Dental advisory given  Plan Discussed with: CRNA and Surgeon  Anesthesia Plan Comments:         Anesthesia Quick Evaluation

## 2021-01-11 NOTE — H&P (Signed)
01/11/2021  Micheal Gomez DOB: 1956-03-08   Patient Care Team: Administration, Veterans as PCP - [name], MD as Consulting Physician (Colon and Rectal Surgery) [name], MD as Attending Physician (Gastroenterology)  ` ` Patient sent for surgical consultation at the request of [name]   Chief Complaint: Large polyp in transverse colon.  Consider surgery. ` ` The patient is a pleasant gentleman.  Underwent colonoscopy by Dr. [name] was [name].  Normally gets care through the [place] system.  Retired from [employer].  This colonoscopy was for screening purposes.  Does not know his family history well since his parents died when he was young.  Numerous polyps noted.  Most polyps removed.  However larger polyp noted and transverse colon.  He thinks it's proximal transverse colon.  Patient sounds like he has a regular bowels favoring constipation.  Occasionally loose.  Thinks it was mostly triggered by metformin in the past.  He is on insulin and oral hypoglycemics.  Last A1c 6.9.  Does not smoke tobacco.  He can walk a half hour without difficulty.  He does have sleep apnea but is stable on the CPAP machine.  Been taking some MiraLAX and its helps.  He had an appendectomy but no other abdominal surgery.   No personal nor family history of GI/colon cancer, inflammatory bowel disease, irritable bowel syndrome, allergy such as Celiac Sprue, dietary/dairy problems, colitis, ulcers nor gastritis.  No recent sick contacts/gastroenteritis.  No travel outside the country.  No changes in diet.  No dysphagia to solids or liquids.  No significant heartburn or reflux.  No melena, hematemesis, coffee ground emesis.  No evidence of prior gastric/peptic ulceration.  Ready for surgery.  Bowel prep done   (Review of systems as stated in this history (HPI) or in the review of systems.  Otherwise all other 12 point ROS are negative) ` ` ###########################################`    CC: REPORT OF SURGICAL PATHOLOGY FINAL DIAGNOSIS Diagnosis 1. 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 2. Surgical [P], colon, transverse - TUBULAR ADENOMA, NEGATIVE FOR HIGH GRADE DYSPLASIA. [name] MD Pathologist, Electronic Signature (Case signed 08/08/2020) Specimen Micheal Gomez and Clinical Information Specimen Comment 1. Constipation, unspecified constipation type, benign neoplasm of ascending and transverse colon Specimen(s) Obtained: 1. Surgical [P], colon, ascending, transverse, polyp (2) 2. Surgical [P], colon, transverse Specimen Clinical Information 1. R/O adenoma 2. Mass vs polyp; R/O adenoma/cancer Micheal Gomez 1. Received in formalin are tan, soft tissue fragments that are submitted in toto. Number: 2, Size: 0.3 cm smallest to 0.5 cm largest, (1 B) ( TA ) 2. Received in formalin are tan, soft tissue fragments that are submitted in toto. Number: M, Size: 0.2 cm smallest to 0.3 cm largest, (1 B) ( TA ) Technical component for this case was performed at 1 of 2 FINAL for Micheal Gomez, Micheal Gomez ([id number])     #################################################################   This patient encounter took 30 minutes today to perform the following: obtain history, perform exam, review outside records, interpret tests & imaging, counsel the patient on their diagnosis; and, document this encounter, including findings & plan in the electronic health record (EHR).     Past Surgical History Illene Regulus, CMA; 10/24/2020 3:53 PM) Appendectomy  Colon Polyp Removal - Colonoscopy    Diagnostic Studies History [name], CMA; 10/24/2020 3:53 PM) Colonoscopy  within last year   Allergies [name], CMA; 10/24/2020 3:55 PM) No Known Drug Allergies  [10/24/2020]:  Medication History Illene Regulus, CMA; 10/24/2020 4:01 PM) Norvasc  (10MG  Tablet, Oral) Active. Abilify  (20MG  Tablet, Oral)  Active. Atorvastatin Calcium  (20MG  Tablet, Oral) Active. Lioresal  (10MG  Tablet, Oral) Active. Doxepin HCl  (50MG  Capsule, Oral) Active. hydroCHLOROthiazide  (25MG  Tablet, Oral) Active. Losartan Potassium  (100MG  Tablet, Oral) Active. Omeprazole  (20MG  Tablet DR, Oral) Active. risperiDONE  (3MG  Tablet, Oral) Active. Senna  (187MG  Tablet, Oral) Active. Baby Aspirin  (81MG  Tablet Chewable, Oral) Active. Medications Reconciled    Social History Illene Regulus, CMA; 10/24/2020 3:53 PM) Alcohol use  Remotely quit alcohol use. Caffeine use  Carbonated beverages, Coffee, Tea. No drug use  Tobacco use  Former smoker.   Family History Illene Regulus, [place]; 10/24/2020 3:53 PM) Family history unknown  First Degree Relatives    Other Problems Illene Regulus, CMA; 10/24/2020 3:53 PM) Diabetes Mellitus  Hypercholesterolemia  Sleep Apnea          Review of Systems ([name] CMA; 10/24/2020 3:53 PM) General Not Present- Appetite Loss, Chills, Fatigue, Fever, Night Sweats, Weight Gain and Weight Loss. Skin Not Present- Change in Wart/Mole, Dryness, Hives, Jaundice, New Lesions, Non-Healing Wounds, Rash and Ulcer. HEENT Not Present- Earache, Hearing Loss, Hoarseness, Nose Bleed, Oral Ulcers, Ringing in the Ears, Seasonal Allergies, Sinus Pain, Sore Throat, Visual Disturbances, Wears glasses/contact lenses and Yellow Eyes. Respiratory Not Present- Bloody sputum, Chronic Cough, Difficulty Breathing, Snoring and Wheezing. Gastrointestinal Not Present- Abdominal Pain, Bloating, Bloody Stool, Change in Bowel Habits, Chronic diarrhea, Constipation, Difficulty Swallowing, Excessive gas, Gets full quickly at meals, Hemorrhoids, Indigestion, Nausea, Rectal Pain and Vomiting. Musculoskeletal Not Present- Back Pain, Joint Pain, Joint Stiffness, Muscle Pain, Muscle Weakness and Swelling of Extremities. Neurological Not Present- Decreased Memory, Fainting, Headaches, Numbness, Seizures, Tingling,  Tremor, Trouble walking and Weakness. Psychiatric Present- Bipolar. Not Present- Anxiety, Change in Sleep Pattern, Depression, Fearful and Frequent crying. Endocrine Present- New Diabetes. Not Present- Cold Intolerance, Excessive Hunger, Hair Changes, Heat Intolerance and Hot flashes. Hematology Not Present- Blood Thinners, Easy Bruising, Excessive bleeding, Gland problems, HIV and Persistent Infections.   Vitals ([name] CMA; 10/24/2020 3:55 PM) 10/24/2020 3:55 PM Weight: 259.8 lb   Height: 69 in  Body Surface Area: 2.31 m   Body Mass Index: 38.37 kg/m   Pulse: 148 (Regular)    BP: 132/84(Sitting, Left Arm, Standard)      BP (!) 159/91   Pulse (!) 102   Temp 98.1 F (36.7 C) (Oral)   Resp 18   Ht 5\' 9"  (1.753 m)   Wt 118.5 kg   SpO2 97%   BMI 38.57 kg/m  01/11/2021          Physical Exam   General Mental Status - Alert. General Appearance - Not in acute distress, Not Sickly. Orientation - Oriented X3. Hydration - Well hydrated. Voice - Normal.   Integumentary Global Assessment Upon inspection and palpation of skin surfaces of the - Axillae: non-tender, no inflammation or ulceration, no drainage. and Distribution of scalp and body hair is normal. General Characteristics Temperature - normal warmth is noted.   Head and Neck Head - normocephalic, atraumatic with no lesions or palpable masses. Face Global Assessment - atraumatic, no absence of expression. Neck Global Assessment - no abnormal movements, no bruit auscultated on the right, no bruit auscultated on the left, no decreased range of motion, non-tender. Trachea - midline. Thyroid Gland Characteristics - non-tender.   Eye Eyeball - Left - Extraocular movements intact, No Nystagmus - Left. Eyeball - Right -  Extraocular movements intact, No Nystagmus - Right. Cornea - Left - No Hazy - Left. Cornea - Right - No Hazy - Right. Sclera/Conjunctiva - Left - No scleral icterus, No Discharge -  Left. Sclera/Conjunctiva - Right - No scleral icterus, No Discharge - Right. Pupil - Left - Direct reaction to light normal. Pupil - Right - Direct reaction to light normal.   ENMT Ears Pinna - Left - no drainage observed, no generalized tenderness observed. Pinna - Right - no drainage observed, no generalized tenderness observed. Nose and Sinuses External Inspection of the Nose - no destructive lesion observed. Inspection of the nares - Left - quiet respiration. Inspection of the nares - Right - quiet respiration. Mouth and Throat Lips - Upper Lip - no fissures observed, no pallor noted. Lower Lip - no fissures observed, no pallor noted. Nasopharynx - no discharge present. Oral Cavity/Oropharynx - Tongue - no dryness observed. Oral Mucosa - no cyanosis observed. Hypopharynx - no evidence of airway distress observed.   Chest and Lung Exam Inspection Movements - Normal and Symmetrical. Accessory muscles - No use of accessory muscles in breathing. Palpation Palpation of the chest reveals - Non-tender. Auscultation Breath sounds - Normal and Clear.   Cardiovascular Auscultation Rhythm - Regular. Murmurs & Other Heart Sounds - Auscultation of the heart reveals - No Murmurs and No Systolic Clicks.   Abdomen Inspection Inspection of the abdomen reveals - No Visible peristalsis and No Abnormal pulsations. Umbilicus - No Bleeding, No Urine drainage. Palpation/Percussion Palpation and Percussion of the abdomen reveal - Soft, Non Tender, No Rebound tenderness, No Rigidity (guarding) and No Cutaneous hyperesthesia.  Note:  Abdomen obese but soft. Nontender.  Not distended.  Right lower quadrant oblique incision consistent with prior open appendectomy. No umbilical or incisional hernias.  No guarding.   Male Genitourinary Sexual Maturity Tanner 5 - Adult hair pattern and Adult penile size and shape.   Peripheral Vascular Upper Extremity Inspection - Left - No Cyanotic nailbeds - Left, Not  Ischemic. Inspection - Right - No Cyanotic nailbeds - Right, Not Ischemic.   Neurologic Neurologic evaluation reveals  - normal attention span and ability to concentrate, able to name objects and repeat phrases. Appropriate fund of knowledge , normal sensation and normal coordination. Mental Status Affect - not angry, not paranoid. Cranial Nerves - Normal Bilaterally. Gait - Normal.   Neuropsychiatric Mental status exam performed with findings of - able to articulate well with normal speech/language, rate, volume and coherence, thought content normal with ability to perform basic computations and apply abstract reasoning and no evidence of hallucinations, delusions, obsessions or homicidal/suicidal ideation.   Musculoskeletal Global Assessment Spine, Ribs and Pelvis - no instability, subluxation or laxity. Right Upper Extremity - no instability, subluxation or laxity.   Lymphatic Head & Neck   General Head & Neck Lymphatics: Bilateral - Description - No Localized lymphadenopathy. Axillary   General Axillary Region: Bilateral - Description - No Localized lymphadenopathy. Femoral & Inguinal   Generalized Femoral & Inguinal Lymphatics: Left - Description - No Localized lymphadenopathy. Right - Description - No Localized lymphadenopathy.       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 Best guess is in proximal transverse colon.   Reasonable to consider segmental colonic resection. Hopefully robotic right sided proximal colectomy with intracorporeal anastomosis.   Hopefully can do this in  a controlled fashion to minimize risks. He had his fiance are interested in proceeding. Questions answered.    The anatomy & physiology of the digestive tract was discussed.  The pathophysiology of the colon was discussed.   Natural history risks without surgery was discussed.   I feel the risks of no intervention will lead to serious problems that outweigh the operative risks; therefore, I recommended a partial colectomy to remove the pathology.  Minimally invasive (Robotic/Laparoscopic) & open techniques were discussed.   Risks such as bleeding, infection, abscess, leak, reoperation, possible ostomy, hernia, heart attack, death, and other risks were discussed.  I noted a good likelihood this will help address the problem.   Goals of post-operative recovery were discussed as well.   Need for adequate nutrition, daily bowel regimen and healthy physical activity, to optimize recovery was noted as well. We will work to minimize complications.  Educational materials were available as well.  Questions were answered.  The patient expresses understanding & wishes to proceed with surgery.       Adin Hector, MD, FACS, MASCRS Esophageal, Gastrointestinal & Colorectal Surgery Robotic and Minimally Invasive Surgery  [facility], [place]  [place]. [address] [place], [place] [address] [phone] Fax [phone] Main  CONTACT INFORMATION:  Weekday (9AM-5PM): Call [facility] main office at [phone]  Weeknight (5PM-9AM) or Weekend/Holiday: Check [email or web address] (password " [email or web address]") for General Surgery [facility] coverage  (Please, do not use SecureChat as it is not reliable communication to operating surgeons for immediate patient care)

## 2021-01-11 NOTE — Discharge Instructions (Addendum)
SURGERY: POST OP INSTRUCTIONS (Surgery for small bowel obstruction, colon resection, etc)   ######################################################################  EAT Gradually transition to a high fiber diet with a fiber supplement over the next few days after discharge  WALK Walk an hour a day.  Control your pain to do that.    CONTROL PAIN Control pain so that you can walk, sleep, tolerate sneezing/coughing, go up/down stairs.  HAVE A BOWEL MOVEMENT DAILY Keep your bowels regular to avoid problems.  OK to try a laxative to override constipation.  OK to use an antidairrheal to slow down diarrhea.  Call if not better after 2 tries  CALL IF YOU HAVE PROBLEMS/CONCERNS Call if you are still struggling despite following these instructions. Call if you have concerns not answered by these instructions  ######################################################################   DIET Follow a light diet the first few days at home.  Start with a bland diet such as soups, liquids, starchy foods, low fat foods, etc.  If you feel full, bloated, or constipated, stay on a ful liquid or pureed/blenderized diet for a few days until you feel better and no longer constipated. Be sure to drink plenty of fluids every day to avoid getting dehydrated (feeling dizzy, not urinating, etc.). Gradually add a fiber supplement to your diet over the next week.  Gradually get back to a regular solid diet.  Avoid fast food or heavy meals the first week as you are more likely to get nauseated. It is expected for your digestive tract to need a few months to get back to normal.  It is common for your bowel movements and stools to be irregular.  You will have occasional bloating and cramping that should eventually fade away.  Until you are eating solid food normally, off all pain medications, and back to regular activities; your bowels will not be normal. Focus on eating a low-fat, high fiber diet the rest of your life  (See Getting to [place], below).  Consider using Metoclopramide (reglan) to help with your gastroparesis and delayed gastric emptying related to your diabetes.  Focus on good glucose control  CARE of your INCISION or WOUND It is good for closed incision and even open wounds to be washed every day.  Shower every day.  Short baths are fine.  Wash the incisions and wounds clean with soap & water.     If you have a closed incision(s), wash the incision with soap & water every day.  You may leave closed incisions open to air if it is dry.   You may cover the incision with clean gauze & replace it after your daily shower for comfort.  It is good for closed incisions and even open wounds to be washed every day.  Shower every day.  Short baths are fine.  Wash the incisions and wounds clean with soap & water.    You may leave closed incisions open to air if it is dry.   You may cover the incision with clean gauze & replace it after your daily shower for comfort.  TEGADERM:  You have clear gauze band-aid dressings over your closed incision(s).  Remove the dressings 3 days after surgery.   If you have an open wound with a wound vac, see wound vac care instructions.     ACTIVITIES as tolerated Start light daily activities --- self-care, walking, climbing stairs-- beginning the day after surgery.  Gradually increase activities as tolerated.  Control your pain to be active.  Stop when you are  tired.  Ideally, walk several times a day, eventually an hour a day.   Most people are back to most day-to-day activities in a few weeks.  It takes 4-8 weeks to get back to unrestricted, intense activity. If you can walk 30 minutes without difficulty, it is safe to try more intense activity such as jogging, treadmill, bicycling, low-impact aerobics, swimming, etc. Save the most intensive and strenuous activity for last (Usually 4-8 weeks after surgery) such as sit-ups, heavy lifting, contact sports, etc.   Refrain from any intense heavy lifting or straining until you are off narcotics for pain control.  You will have off days, but things should improve week-by-week. DO NOT PUSH THROUGH PAIN.  Let pain be your guide: If it hurts to do something, don't do it.  Pain is your body warning you to avoid that activity for another week until the pain goes down. You may drive when you are no longer taking narcotic prescription pain medication, you can comfortably wear a seatbelt, and you can safely make sudden turns/stops to protect yourself without hesitating due to pain. You may have sexual intercourse when it is comfortable. If it hurts to do something, stop.  MEDICATIONS Take your usually prescribed home medications unless otherwise directed.   Blood thinners:  Usually you can restart any strong blood thinners after the second postoperative day.  It is OK to take aspirin right away.     If you are on strong blood thinners (warfarin/Coumadin, Plavix, Xerelto, Eliquis, Pradaxa, etc), discuss with your surgeon, medicine PCP, and/or cardiologist for instructions on when to restart the blood thinner & if blood monitoring is needed (PT/INR blood check, etc).     PAIN CONTROL Pain after surgery or related to activity is often due to strain/injury to muscle, tendon, nerves and/or incisions.  This pain is usually short-term and will improve in a few months.  To help speed the process of healing and to get back to regular activity more quickly, DO THE FOLLOWING THINGS TOGETHER: Increase activity gradually.  DO NOT PUSH THROUGH PAIN Use Ice and/or Heat Try Gentle Massage and/or Stretching Take over the counter pain medication Take Narcotic prescription pain medication for more severe pain  Good pain control = faster recovery.  It is better to take more medicine to be more active than to stay in bed all day to avoid medications.  Increase activity gradually Avoid heavy lifting at first, then increase to lifting  as tolerated over the next 6 weeks. Do not "push through" the pain.  Listen to your body and avoid positions and maneuvers than reproduce the pain.  Wait a few days before trying something more intense Walking an hour a day is encouraged to help your body recover faster and more safely.  Start slowly and stop when getting sore.  If you can walk 30 minutes without stopping or pain, you can try more intense activity (running, jogging, aerobics, cycling, swimming, treadmill, sex, sports, weightlifting, etc.) Remember: If it hurts to do it, then don't do it! Use Ice and/or Heat You will have swelling and bruising around the incisions.  This will take several weeks to resolve. Ice packs or heating pads (6-8 times a day, 30-60 minutes at a time) will help sooth soreness & bruising. Some people prefer to use ice alone, heat alone, or alternate between ice & heat.  Experiment and see what works best for you.  Consider trying ice for the first few days to help decrease swelling and bruising;  then, switch to heat to help relax sore spots and speed recovery. Shower every day.  Short baths are fine.  It feels good!  Keep the incisions and wounds clean with soap & water.   Try Gentle Massage and/or Stretching Massage at the area of pain many times a day Stop if you feel pain - do not overdo it Take over the counter pain medication This helps the muscle and nerve tissues become less irritable and calm down faster Choose ONE of the following over-the-counter anti-inflammatory medications: Acetaminophen 500mg  tabs (Tylenol) 1-2 pills with every meal and just before bedtime (avoid if you have liver problems or if you have acetaminophen in you narcotic prescription) Naproxen 220mg  tabs (ex. Aleve, Naprosyn) 1-2 pills twice a day (avoid if you have kidney, stomach, IBD, or bleeding problems) Ibuprofen 200mg  tabs (ex. Advil, Motrin) 3-4 pills with every meal and just before bedtime (avoid if you have kidney, stomach,  IBD, or bleeding problems) Take with food/snack several times a day as directed for at least 2 weeks to help keep pain / soreness down & more manageable. Take Narcotic prescription pain medication for more severe pain A prescription for strong pain control is often given to you upon discharge (for example: oxycodone/Percocet, hydrocodone/Norco/Vicodin, or tramadol/Ultram) Take your pain medication as prescribed. Be mindful that most narcotic prescriptions contain Tylenol (acetaminophen) as well - avoid taking too much Tylenol. If you are having problems/concerns with the prescription medicine (does not control pain, nausea, vomiting, rash, itching, etc.), please call us [phone] to see if we need to switch you to a different pain medicine that will work better for you and/or control your side effects better. If you need a refill on your pain medication, you must call the office before 4 pm and on weekdays only.  By federal law, prescriptions for narcotics cannot be called into a pharmacy.  They must be filled out on paper & picked up from our office by the patient or authorized caretaker.  Prescriptions cannot be filled after 4 pm nor on weekends.    WHEN TO CALL us [phone] Severe uncontrolled or worsening pain  Fever over 101 F (38.5 C) Concerns with the incision: Worsening pain, redness, rash/hives, swelling, bleeding, or drainage Reactions / problems with new medications (itching, rash, hives, nausea, etc.) Nausea and/or vomiting Difficulty urinating Difficulty breathing Worsening fatigue, dizziness, lightheadedness, blurred vision Other concerns If you are not getting better after two weeks or are noticing you are getting worse, contact our office [phone] for further advice.  We may need to adjust your medications, re-evaluate you in the office, send you to the emergency room, or see what other things we can do to help. The clinic staff is available to answer your  questions during regular business hours (8:30am-5pm).  Please don't hesitate to call and ask to speak to one of our nurses for clinical concerns.    A surgeon from [facility] is always on call at the hospitals 24 hours/day If you have a medical emergency, go to the nearest emergency room or call 911.  FOLLOW UP in our office One the day of your discharge from the hospital (or the next business weekday), please call [facility] to set up or confirm an appointment to see your surgeon in the office for a follow-up appointment.  Usually it is 2-3 weeks after your surgery.   If you have skin staples at your incision(s), let the office know so we can  set up a time in the office for the nurse to remove them (usually around 10 days after surgery). Make sure that you call for appointments the day of discharge (or the next business weekday) from the hospital to ensure a convenient appointment time. IF YOU HAVE DISABILITY OR FAMILY LEAVE FORMS, BRING THEM TO THE OFFICE FOR PROCESSING.  DO NOT GIVE THEM TO YOUR DOCTOR.  [facility] [address] [place], [place], [place]  [address] ? [phone] - Main [phone] - [place],  [phone] - Fax [email or web address]  GETTING TO GOOD BOWEL HEALTH. It is expected for your digestive tract to need a few months to get back to normal.  It is common for your bowel movements and stools to be irregular.  You will have occasional bloating and cramping that should eventually fade away.  Until you are eating solid food normally, off all pain medications, and back to regular activities; your bowels will not be normal.   Avoiding constipation The goal: ONE SOFT BOWEL MOVEMENT A DAY!    Drink plenty of fluids.  Choose water first. TAKE A FIBER SUPPLEMENT EVERY DAY THE REST OF YOUR LIFE During your first week back home, gradually add back a fiber supplement every day Experiment which form you can  tolerate.   There are many forms such as powders, tablets, wafers, gummies, etc Psyllium bran (Metamucil), methylcellulose (Citrucel), Miralax or Glycolax, Benefiber, Flax Seed.  Adjust the dose week-by-week (1/2 dose/day to 6 doses a day) until you are moving your bowels 1-2 times a day.  Cut back the dose or try a different fiber product if it is giving you problems such as diarrhea or bloating. Sometimes a laxative is needed to help jump-start bowels if constipated until the fiber supplement can help regulate your bowels.  If you are tolerating eating & you are farting, it is okay to try a gentle laxative such as double dose MiraLax, prune juice, or Milk of Magnesia.  Avoid using laxatives too often. Stool softeners can sometimes help counteract the constipating effects of narcotic pain medicines.  It can also cause diarrhea, so avoid using for too long. If you are still constipated despite taking fiber daily, eating solids, and a few doses of laxatives, call our office. Controlling diarrhea Try drinking liquids and eating bland foods for a few days to avoid stressing your intestines further. Avoid dairy products (especially milk & ice cream) for a short time.  The intestines often can lose the ability to digest lactose when stressed. Avoid foods that cause gassiness or bloating.  Typical foods include beans and other legumes, cabbage, broccoli, and dairy foods.  Avoid greasy, spicy, fast foods.  Every person has some sensitivity to other foods, so listen to your body and avoid those foods that trigger problems for you. Probiotics (such as active yogurt, Align, etc) may help repopulate the intestines and colon with normal bacteria and calm down a sensitive digestive tract Adding a fiber supplement gradually can help thicken stools by absorbing excess fluid and retrain the intestines to act more normally.  Slowly increase the dose over a few weeks.  Too much fiber too soon can backfire and cause  cramping & bloating. It is okay to try and slow down diarrhea with a few doses of antidiarrheal medicines.   Bismuth subsalicylate (ex. Kayopectate, Pepto Bismol) for a few doses can help control diarrhea.  Avoid if pregnant.   Loperamide (Imodium) can slow down diarrhea.  Start with  one tablet (2mg ) first.  Avoid if you are having fevers or severe pain.  ILEOSTOMY PATIENTS WILL HAVE CHRONIC DIARRHEA since their colon is not in use.    Drink plenty of liquids.  You will need to drink even more glasses of water/liquid a day to avoid getting dehydrated. Record output from your ileostomy.  Expect to empty the bag every 3-4 hours at first.  Most people with a permanent ileostomy empty their bag 4-6 times at the least.   Use antidiarrheal medicine (especially Imodium) several times a day to avoid getting dehydrated.  Start with a dose at bedtime & breakfast.  Adjust up or down as needed.  Increase antidiarrheal medications as directed to avoid emptying the bag more than 8 times a day (every 3 hours). Work with your wound ostomy nurse to learn care for your ostomy.  See ostomy care instructions. TROUBLESHOOTING IRREGULAR BOWELS 1) Start with a soft & bland diet. No spicy, greasy, or fried foods.  2) Avoid gluten/wheat or dairy products from diet to see if symptoms improve. 3) Miralax 17gm or flax seed mixed in [place]. water or juice-daily. May use 2-4 times a day as needed. 4) Gas-X, Phazyme, etc. as needed for gas & bloating.  5) Prilosec (omeprazole) over-the-counter as needed 6)  Consider probiotics (Align, Activa, etc) to help calm the bowels down  Call your doctor if you are getting worse or not getting better.  Sometimes further testing (cultures, endoscopy, X-ray studies, CT scans, bloodwork, etc.) may be needed to help diagnose and treat the cause of the diarrhea. [facility], [place], [place], [place], [place]  [address] [phone] - Main.    [phone]  -  Toll Free.   [phone] - Fax [email or web address]

## 2021-01-11 NOTE — Anesthesia Procedure Notes (Signed)
Procedure Name: Intubation Date/Time: 01/11/2021 6:03 PM Performed by: Cleda Daub, CRNA Pre-anesthesia Checklist: Patient identified, Emergency Drugs available, Suction available and Patient being monitored Patient Re-evaluated:Patient Re-evaluated prior to induction Oxygen Delivery Method: Circle system utilized Preoxygenation: Pre-oxygenation with 100% oxygen Induction Type: IV induction Ventilation: Mask ventilation without difficulty Laryngoscope Size: Mac and 4 Grade View: Grade I Tube type: Oral Tube size: 8.0 mm Number of attempts: 1 Airway Equipment and Method: Stylet and Oral airway Placement Confirmation: ETT inserted through vocal cords under direct vision, positive ETCO2 and breath sounds checked- equal and bilateral Secured at: 23 cm Tube secured with: Tape Dental Injury: Teeth and Oropharynx as per pre-operative assessment

## 2021-01-12 LAB — BASIC METABOLIC PANEL
Anion gap: 5 (ref 5–15)
BUN: 15 mg/dL (ref 8–23)
CO2: 23 mmol/L (ref 22–32)
Calcium: 8.1 mg/dL — ABNORMAL LOW (ref 8.9–10.3)
Chloride: 109 mmol/L (ref 98–111)
Creatinine, Ser: 1.3 mg/dL — ABNORMAL HIGH (ref 0.61–1.24)
GFR, Estimated: >60 mL/min (ref >60)
Glucose, Bld: 177 mg/dL — ABNORMAL HIGH (ref 70–99)
Potassium: 4.2 mmol/L (ref 3.5–5.1)
Sodium: 137 mmol/L (ref 135–145)

## 2021-01-12 LAB — CBC
HCT: 39 % (ref 39.0–52.0)
Hemoglobin: 12.5 g/dL — ABNORMAL LOW (ref 13.0–17.0)
MCH: 26.8 pg (ref 26.0–34.0)
MCHC: 32.1 g/dL (ref 30.0–36.0)
MCV: 83.7 fL (ref 80.0–100.0)
Platelets: 198 10*3/uL (ref 150–400)
RBC: 4.66 MIL/uL (ref 4.22–5.81)
RDW: 15.7 % — ABNORMAL HIGH (ref 11.5–15.5)
WBC: 14.1 10*3/uL — ABNORMAL HIGH (ref 4.0–10.5)
nRBC: 0 % (ref 0.0–0.2)

## 2021-01-12 LAB — GLUCOSE, CAPILLARY
Glucose-Capillary: 102 mg/dL — ABNORMAL HIGH (ref 70–99)
Glucose-Capillary: 133 mg/dL — ABNORMAL HIGH (ref 70–99)
Glucose-Capillary: 133 mg/dL — ABNORMAL HIGH (ref 70–99)
Glucose-Capillary: 153 mg/dL — ABNORMAL HIGH (ref 70–99)
Glucose-Capillary: 184 mg/dL — ABNORMAL HIGH (ref 70–99)

## 2021-01-12 LAB — MAGNESIUM: Magnesium: 1.6 mg/dL — ABNORMAL LOW (ref 1.7–2.4)

## 2021-01-12 NOTE — Progress Notes (Addendum)
Micheal Gomez [id number] Sep 15, 1955  CARE TEAM:  PCP: Administration, Veterans  Outpatient Care Team: Patient Care Team: Administration, Veterans as PCP - [name], MD as Consulting Physician (Colon and Rectal Surgery) [name], MD as Attending Physician (Gastroenterology)  Inpatient Treatment Team: Treatment Team: Attending Provider: [name], MD; Utilization Review: Beau Fanny, RN; Registered Nurse: Mohammed Kindle, RN   Problem List:   Active Problems:   Mass of hepatic flexure of colon   1 Day Post-Op  01/11/2021  POST-OPERATIVE DIAGNOSIS:  Transverse colon polyp unresectable by colonscopy   PROCEDURE:   XI ROBOT ASSISTED COLECTOMY PROXIMAL OMENTOPEXY TRANSVERSUS ABDOMINIS PLANE (TAP) BLOCK - BILATERAL   SURGEON:  Adin Hector, MD   Assessment  Recovering relatively well  [facility] Stay = 1 days)  Plan:  Enhanced recovery pathway  Advance diet  Medlock fluids  Stop Foley catheter   Diabetic control with sliding scale insulin.  Baseline Levemir.  Hold off on oral hypoglycemic  Hypertension control.  As needed backup  Follow-up on pathology.  History of ulcers.  Omeprazole.  GERD.  As needed Maalox backup.  Sleep apnea.  CPAP at night  VTE prophylaxis- SCDs, etc.  enoxaparin  mobilize as tolerated to help recovery  Disposition:  Disposition:  The patient is from: Home  Anticipate discharge to:  Home  Anticipated Date of Discharge is:  January 15,2022    Barriers to discharge:  Pending Clinical improvement (more likely than not)  Patient currently is NOT MEDICALLY STABLE for discharge from the hospital from a surgery standpoint.      20 minutes spent in review, evaluation, examination, counseling, and coordination of care.   I have reviewed this patient's available data, including medical history, events of note, physical examination and test results as part of my evaluation.  A significant  portion of that time was spent in counseling.  Care during the described time interval was provided by me.  01/12/2021    Subjective: (Chief complaint)  Denies much pain  Tolerating liquids.  Walked in hallways.  Objective:  Vital signs:  Vitals:   01/11/21 2228 01/11/21 2339 01/12/21 0232 01/12/21 0636  BP: (!) 165/87  (!) 155/83 (!) 159/75  Pulse: (!) 103 (!) 104 (!) 110 (!) 111  Resp: 18 20 19 18   Temp: 98.2 F (36.8 C)  98 F (36.7 C) 97.7 F (36.5 C)  TempSrc: Oral   Oral  SpO2: 97% 98% 97% 93%  Weight:      Height:        Last BM Date: 01/10/21  Intake/Output   Yesterday:  07/13 0701 - 07/14 0700 In: 3200 [I.V.:2500; IV Piggyback:100] Out: 975 [Urine:825; Blood:150] This shift:  Total I/O In: -  Out: 700 [Urine:700]  Bowel function:  Flatus: YES  BM:  No  Drain: (No drain)   Physical Exam:  General: Pt awake/alert in no acute distress Eyes: PERRL, normal EOM.  Sclera clear.  No icterus Neuro: CN II-XII intact w/o focal sensory/motor deficits. Lymph: No head/neck/groin lymphadenopathy Psych:  No delerium/psychosis/paranoia.  Oriented x 4 HENT: Normocephalic, Mucus membranes moist.  No thrush Neck: Supple, No tracheal deviation.  No obvious thyromegaly Chest: No pain to chest wall compression.  Good respiratory excursion.  No audible wheezing CV:  Pulses intact.  Regular rhythm.  No major extremity edema MS: Normal AROM mjr joints.  No obvious deformity  Abdomen: Soft.  Nondistended.  Nontender.  No evidence of peritonitis.  No incarcerated hernias.  Ext:  No deformity.  No mjr edema.  No cyanosis Skin: No petechiae / purpurea.  No major sores.  Warm and dry    Results:   Cultures: No results found for this or any previous visit (from the past 720 hour(s)).  Labs: Results for orders placed or performed during the hospital encounter of 01/11/21 (from the past 48 hour(s))  Glucose, capillary     Status: Abnormal   Collection Time:  01/11/21 11:53 AM  Result Value Ref Range   Glucose-Capillary 136 (H) 70 - 99 mg/dL    Comment: Glucose reference range applies only to samples taken after fasting for at least 8 hours.  ABO/Rh     Status: None   Collection Time: 01/11/21 12:05 PM  Result Value Ref Range   ABO/RH(D)      B POS Performed at [facility], [place] [address] [place], [place] [address]   Glucose, capillary     Status: Abnormal   Collection Time: 01/11/21  9:07 PM  Result Value Ref Range   Glucose-Capillary 159 (H) 70 - 99 mg/dL    Comment: Glucose reference range applies only to samples taken after fasting for at least 8 hours.  Glucose, capillary     Status: Abnormal   Collection Time: 01/11/21 11:10 PM  Result Value Ref Range   Glucose-Capillary 166 (H) 70 - 99 mg/dL    Comment: Glucose reference range applies only to samples taken after fasting for at least 8 hours.  Basic metabolic panel     Status: Abnormal   Collection Time: 01/12/21  4:41 AM  Result Value Ref Range   Sodium 137 135 - 145 mmol/L   Potassium 4.2 3.5 - 5.1 mmol/L   Chloride 109 98 - 111 mmol/L   CO2 23 22 - 32 mmol/L   Glucose, Bld 177 (H) 70 - 99 mg/dL    Comment: Glucose reference range applies only to samples taken after fasting for at least 8 hours.   BUN 15 8 - 23 mg/dL   Creatinine, Ser 1.30 (H) 0.61 - 1.24 mg/dL   Calcium 8.1 (L) 8.9 - 10.3 mg/dL   GFR, Estimated >60 >60 mL/min    Comment: (NOTE) Calculated using the CKD-EPI Creatinine Equation (2021)    Anion gap 5 5 - 15    Comment: Performed at [facility], [place] [address] [place], [place] [address]  CBC     Status: Abnormal   Collection Time: 01/12/21  4:41 AM  Result Value Ref Range   WBC 14.1 (H) 4.0 - 10.5 K/uL   RBC 4.66 4.22 - 5.81 MIL/uL   Hemoglobin 12.5 (L) 13.0 - 17.0 g/dL   HCT 39.0 39.0 - 52.0 %   MCV 83.7 80.0 - 100.0 fL   MCH 26.8 26.0 - 34.0 pg   MCHC 32.1 30.0 - 36.0 g/dL   RDW 15.7 (H) 11.5 - 15.5 %    Platelets 198 150 - 400 K/uL   nRBC 0.0 0.0 - 0.2 %    Comment: Performed at [facility], [place] [address] [place], [place] [address]  Magnesium     Status: Abnormal   Collection Time: 01/12/21  4:41 AM  Result Value Ref Range   Magnesium 1.6 (L) 1.7 - 2.4 mg/dL    Comment: Performed at [facility], [place] [address], [place], [place] [address]  Glucose, capillary     Status: Abnormal   Collection Time: 01/12/21  7:41 AM  Result Value  Ref Range   Glucose-Capillary 153 (H) 70 - 99 mg/dL    Comment: Glucose reference range applies only to samples taken after fasting for at least 8 hours.    Imaging / Studies: No results found.  Medications / Allergies: per chart  Antibiotics: Anti-infectives (From admission, onward)    Start     Dose/Rate Route Frequency Ordered Stop   01/12/21 0600  cefoTEtan (CEFOTAN) 2 g in sodium chloride 0.9 % 100 mL IVPB        2 g 200 mL/hr over 30 Minutes Intravenous Every 12 hours 01/11/21 2249 01/12/21 0701   01/11/21 1400  neomycin (MYCIFRADIN) tablet 1,000 mg  Status:  Discontinued       See Hyperspace for full Linked Orders Report.   1,000 mg Oral 3 times per day 01/11/21 1145 01/11/21 1151   01/11/21 1400  metroNIDAZOLE (FLAGYL) tablet 1,000 mg  Status:  Discontinued       See Hyperspace for full Linked Orders Report.   1,000 mg Oral 3 times per day 01/11/21 1145 01/11/21 1151   01/11/21 0600  cefoTEtan (CEFOTAN) 2 g in sodium chloride 0.9 % 100 mL IVPB        2 g 200 mL/hr over 30 Minutes Intravenous On call to O.R. 01/10/21 0826 01/11/21 1804         Note: Portions of this report may have been transcribed using voice recognition software. Every effort was made to ensure accuracy; however, inadvertent computerized transcription errors may be present.   Any transcriptional errors that result from this process are unintentional.    Adin Hector, MD, FACS, MASCRS Esophageal, Gastrointestinal & Colorectal  Surgery Robotic and Minimally Invasive Surgery  [facility], [place]  [place]. [address], [place], [place] [address] [phone] Fax [phone] Main  CONTACT INFORMATION:  Weekday (9AM-5PM): Call [facility] main office at [phone]  Weeknight (5PM-9AM) or Weekend/Holiday: Check [email or web address] (password " [email or web address]") for General Surgery [facility] coverage  (Please, do not use SecureChat as it is not reliable communication to operating surgeons for immediate patient care)      01/12/2021  7:43 AM

## 2021-01-13 ENCOUNTER — Encounter: Payer: Self-pay | Admitting: Surgery

## 2021-01-13 ENCOUNTER — Encounter (HOSPITAL_COMMUNITY): Payer: Self-pay | Admitting: Surgery

## 2021-01-13 DIAGNOSIS — Z8711 Personal history of peptic ulcer disease: Secondary | ICD-10-CM | POA: Insufficient documentation

## 2021-01-13 DIAGNOSIS — K219 Gastro-esophageal reflux disease without esophagitis: Secondary | ICD-10-CM

## 2021-01-13 DIAGNOSIS — E7800 Pure hypercholesterolemia, unspecified: Secondary | ICD-10-CM | POA: Insufficient documentation

## 2021-01-13 DIAGNOSIS — I10 Essential (primary) hypertension: Secondary | ICD-10-CM | POA: Insufficient documentation

## 2021-01-13 DIAGNOSIS — N182 Chronic kidney disease, stage 2 (mild): Secondary | ICD-10-CM

## 2021-01-13 DIAGNOSIS — Z794 Long term (current) use of insulin: Secondary | ICD-10-CM

## 2021-01-13 DIAGNOSIS — E119 Type 2 diabetes mellitus without complications: Secondary | ICD-10-CM

## 2021-01-13 LAB — GLUCOSE, CAPILLARY
Glucose-Capillary: 125 mg/dL — ABNORMAL HIGH (ref 70–99)
Glucose-Capillary: 120 mg/dL — ABNORMAL HIGH (ref 70–99)
Glucose-Capillary: 128 mg/dL — ABNORMAL HIGH (ref 70–99)
Glucose-Capillary: 114 mg/dL — ABNORMAL HIGH (ref 70–99)
Glucose-Capillary: 155 mg/dL — ABNORMAL HIGH (ref 70–99)

## 2021-01-13 LAB — SURGICAL PATHOLOGY

## 2021-01-13 MED ORDER — SIMETHICONE 80 MG PO CHEW
40.0000 mg | CHEWABLE_TABLET | Freq: Four times a day (QID) | ORAL | Status: AC
  Administered 2021-01-13 – 2021-01-15 (×8): 40 mg via ORAL
  Filled 2021-01-13 (×7): qty 1

## 2021-01-13 NOTE — Progress Notes (Addendum)
Micheal Gomez [id number] 1956-01-30  CARE TEAM:  PCP: Administration, Veterans  Outpatient Care Team: Patient Care Team: Administration, Veterans as PCP - [name], MD as Consulting Physician (Colon and Rectal Surgery) [name], MD as Attending Physician (Gastroenterology)  Inpatient Treatment Team: Treatment Team: Attending Provider: [name], MD; Social Worker: Trish Mage, [place]; Utilization Review: Beau Fanny, RN; Charge Nurse: Steward Ros, RN; Pharmacist: Eudelia Bunch, [facility]   Problem List:   Active Problems:   Insulin-requiring or dependent type II diabetes mellitus ([place])   OSA on CPAP   Mass of hepatic flexure of colon   Severe obesity (BMI 35.0-39.9) with comorbidity (HCC)   Hypertension   CKD (chronic kidney disease) stage 2, GFR 60-89 ml/min   GERD (gastroesophageal reflux disease)   2 Days Post-Op  01/11/2021  POST-OPERATIVE DIAGNOSIS:  Transverse colon polyp unresectable by colonscopy   PROCEDURE:   XI ROBOT ASSISTED COLECTOMY PROXIMAL OMENTOPEXY TRANSVERSUS ABDOMINIS PLANE (TAP) BLOCK - BILATERAL   SURGEON:  Adin Hector, MD   Assessment  Recovering relatively well  [facility] Stay = 2 days)  Plan:  Enhanced recovery pathway  Keep in liquids until has flatus or bowel movement.  Medlock fluids  Diabetic control with sliding scale insulin.  Baseline Levemir.  Hold off on oral hypoglycemic  Hypertension control.  As needed backup  Follow-up on pathology.  History of ulcers.  Omeprazole.  GERD.  As needed Maalox backup.  Sleep apnea.  CPAP at night  VTE prophylaxis- SCDs, etc.  enoxaparin  mobilize as tolerated to help recovery  Disposition:  Disposition:  The patient is from: Home  Anticipate discharge to:  Home  Anticipated Date of Discharge is:  January 18,2022    Barriers to discharge:  Pending Clinical improvement (more likely than not)  Patient currently is NOT MEDICALLY STABLE  for discharge from the hospital from a surgery standpoint.      20 minutes spent in review, evaluation, examination, counseling, and coordination of care.   I have reviewed this patient's available data, including medical history, events of note, physical examination and test results as part of my evaluation.  A significant portion of that time was spent in counseling.  Care during the described time interval was provided by me.  01/13/2021    Subjective: (Chief complaint)  Abdominal cramping.  Tolerating some liquids but getting full.  Walked in hallways.  Objective:  Vital signs:  Vitals:   01/12/21 1815 01/12/21 2115 01/13/21 0122 01/13/21 0505  BP: (!) 150/72 (!) 158/80 (!) 162/72 (!) 156/69  Pulse: 98 (!) 103 94 94  Resp:  17 17 17   Temp:  97.6 F (36.4 C) (!) 97.2 F (36.2 C) 98 F (36.7 C)  TempSrc:      SpO2:  94% 94% 95%  Weight:      Height:        Last BM Date: 01/10/21  Intake/Output   Yesterday:  07/14 0701 - 07/15 0700 In: 595 [P.O.:595] Out: 1950 [Urine:1950] This shift:  No intake/output data recorded.  Bowel function:  Flatus: No  BM:  No  Drain: (No drain)   Physical Exam:  General: Pt awake/alert in no acute distress Eyes: PERRL, normal EOM.  Sclera clear.  No icterus Neuro: CN II-XII intact w/o focal sensory/motor deficits. Lymph: No head/neck/groin lymphadenopathy Psych:  No delerium/psychosis/paranoia.  Oriented x 4 HENT: Normocephalic, Mucus membranes moist.  No thrush Neck: Supple, No tracheal deviation.  No obvious thyromegaly Chest: No  pain to chest wall compression.  Good respiratory excursion.  No audible wheezing CV:  Pulses intact.  Regular rhythm.  No major extremity edema MS: Normal AROM mjr joints.  No obvious deformity  Abdomen: Soft.  Moderately distended.  Nontender.  No evidence of peritonitis.  No incarcerated hernias.  Ext:   No deformity.  No mjr edema.  No cyanosis Skin: No petechiae / purpurea.  No major  sores.  Warm and dry    Results:   Cultures: No results found for this or any previous visit (from the past 720 hour(s)).  Labs: Results for orders placed or performed during the hospital encounter of 01/11/21 (from the past 48 hour(s))  Glucose, capillary     Status: Abnormal   Collection Time: 01/11/21 11:53 AM  Result Value Ref Range   Glucose-Capillary 136 (H) 70 - 99 mg/dL    Comment: Glucose reference range applies only to samples taken after fasting for at least 8 hours.  ABO/Rh     Status: None   Collection Time: 01/11/21 12:05 PM  Result Value Ref Range   ABO/RH(D)      B POS Performed at [facility], [place] [address], [place], [place] [address]   Glucose, capillary     Status: Abnormal   Collection Time: 01/11/21  9:07 PM  Result Value Ref Range   Glucose-Capillary 159 (H) 70 - 99 mg/dL    Comment: Glucose reference range applies only to samples taken after fasting for at least 8 hours.  Glucose, capillary     Status: Abnormal   Collection Time: 01/11/21 11:10 PM  Result Value Ref Range   Glucose-Capillary 166 (H) 70 - 99 mg/dL    Comment: Glucose reference range applies only to samples taken after fasting for at least 8 hours.  Basic metabolic panel     Status: Abnormal   Collection Time: 01/12/21  4:41 AM  Result Value Ref Range   Sodium 137 135 - 145 mmol/L   Potassium 4.2 3.5 - 5.1 mmol/L   Chloride 109 98 - 111 mmol/L   CO2 23 22 - 32 mmol/L   Glucose, Bld 177 (H) 70 - 99 mg/dL    Comment: Glucose reference range applies only to samples taken after fasting for at least 8 hours.   BUN 15 8 - 23 mg/dL   Creatinine, Ser 1.30 (H) 0.61 - 1.24 mg/dL   Calcium 8.1 (L) 8.9 - 10.3 mg/dL   GFR, Estimated >60 >60 mL/min    Comment: (NOTE) Calculated using the CKD-EPI Creatinine Equation (2021)    Anion gap 5 5 - 15    Comment: Performed at [facility], [place] [address] [place], [place] [address]  CBC     Status: Abnormal    Collection Time: 01/12/21  4:41 AM  Result Value Ref Range   WBC 14.1 (H) 4.0 - 10.5 K/uL   RBC 4.66 4.22 - 5.81 MIL/uL   Hemoglobin 12.5 (L) 13.0 - 17.0 g/dL   HCT 39.0 39.0 - 52.0 %   MCV 83.7 80.0 - 100.0 fL   MCH 26.8 26.0 - 34.0 pg   MCHC 32.1 30.0 - 36.0 g/dL   RDW 15.7 (H) 11.5 - 15.5 %   Platelets 198 150 - 400 K/uL   nRBC 0.0 0.0 - 0.2 %    Comment: Performed at [facility], [place] [address], [place], [place] [address]  Magnesium     Status: Abnormal   Collection Time:  01/12/21  4:41 AM  Result Value Ref Range   Magnesium 1.6 (L) 1.7 - 2.4 mg/dL    Comment: Performed at [facility], [place] [address] [place] [address], [place],  [address]  Glucose, capillary     Status: Abnormal   Collection Time: 01/12/21  7:41 AM  Result Value Ref Range   Glucose-Capillary 153 (H) 70 - 99 mg/dL    Comment: Glucose reference range applies only to samples taken after fasting for at least 8 hours.  Glucose, capillary     Status: Abnormal   Collection Time: 01/12/21 11:17 AM  Result Value Ref Range   Glucose-Capillary 184 (H) 70 - 99 mg/dL    Comment: Glucose reference range applies only to samples taken after fasting for at least 8 hours.  Glucose, capillary     Status: Abnormal   Collection Time: 01/12/21  4:26 PM  Result Value Ref Range   Glucose-Capillary 133 (H) 70 - 99 mg/dL    Comment: Glucose reference range applies only to samples taken after fasting for at least 8 hours.  Glucose, capillary     Status: Abnormal   Collection Time: 01/12/21  8:16 PM  Result Value Ref Range   Glucose-Capillary 102 (H) 70 - 99 mg/dL    Comment: Glucose reference range applies only to samples taken after fasting for at least 8 hours.  Glucose, capillary     Status: Abnormal   Collection Time: 01/12/21 11:42 PM  Result Value Ref Range   Glucose-Capillary 133 (H) 70 - 99 mg/dL    Comment: Glucose reference range applies only to samples taken after fasting for at least 8  hours.  Glucose, capillary     Status: Abnormal   Collection Time: 01/13/21  3:47 AM  Result Value Ref Range   Glucose-Capillary 125 (H) 70 - 99 mg/dL    Comment: Glucose reference range applies only to samples taken after fasting for at least 8 hours.  Glucose, capillary     Status: Abnormal   Collection Time: 01/13/21  7:43 AM  Result Value Ref Range   Glucose-Capillary 120 (H) 70 - 99 mg/dL    Comment: Glucose reference range applies only to samples taken after fasting for at least 8 hours.    Imaging / Studies: No results found.  Medications / Allergies: per chart  Antibiotics: Anti-infectives (From admission, onward)    Start     Dose/Rate Route Frequency Ordered Stop   01/12/21 0600  cefoTEtan (CEFOTAN) 2 g in sodium chloride 0.9 % 100 mL IVPB        2 g 200 mL/hr over 30 Minutes Intravenous Every 12 hours 01/11/21 2249 01/12/21 1723   01/11/21 1400  neomycin (MYCIFRADIN) tablet 1,000 mg  Status:  Discontinued       See Hyperspace for full Linked Orders Report.   1,000 mg Oral 3 times per day 01/11/21 1145 01/11/21 1151   01/11/21 1400  metroNIDAZOLE (FLAGYL) tablet 1,000 mg  Status:  Discontinued       See Hyperspace for full Linked Orders Report.   1,000 mg Oral 3 times per day 01/11/21 1145 01/11/21 1151   01/11/21 0600  cefoTEtan (CEFOTAN) 2 g in sodium chloride 0.9 % 100 mL IVPB        2 g 200 mL/hr over 30 Minutes Intravenous On call to O.R. 01/10/21 0826 01/11/21 1804         Note: Portions of this report may have been transcribed using voice recognition software. Every effort  was made to ensure accuracy; however, inadvertent computerized transcription errors may be present.   Any transcriptional errors that result from this process are unintentional.    Adin Hector, MD, FACS, MASCRS Esophageal, Gastrointestinal & Colorectal Surgery Robotic and Minimally Invasive Surgery  [facility], [place]  [place]. [address] [place], [place] [address] [phone] Fax [phone] Main  CONTACT INFORMATION:  Weekday (9AM-5PM): Call [facility] main office at [phone]  Weeknight (5PM-9AM) or Weekend/Holiday: Check [email or web address] (password " [email or web address]") for General Surgery [facility] coverage  (Please, do not use SecureChat as it is not reliable communication to operating surgeons for immediate patient care)      01/13/2021  7:54 AM

## 2021-01-13 NOTE — Anesthesia Postprocedure Evaluation (Signed)
Anesthesia Post Note  Patient: [name]  Procedure(s) Performed: XI ROBOT ASSISTED COLECTOMY PROXIMAL (Right: Abdomen)     Patient location during evaluation: PACU Anesthesia Type: General Level of consciousness: awake and alert Pain management: pain level controlled Vital Signs Assessment: post-procedure vital signs reviewed and stable Respiratory status: spontaneous breathing, nonlabored ventilation, respiratory function stable and patient connected to nasal cannula oxygen Cardiovascular status: blood pressure returned to baseline and stable Postop Assessment: no apparent nausea or vomiting Anesthetic complications: no   No notable events documented.  Last Vitals:  Vitals:   01/13/21 0122 01/13/21 0505  BP: (!) 162/72 (!) 156/69  Pulse: 94 94  Resp: 17 17  Temp: (!) 36.2 C 36.7 C  SpO2: 94% 95%    Last Pain:  Vitals:   01/13/21 0541  TempSrc:   PainSc: 5                  Orbin Mayeux S

## 2021-01-14 LAB — GLUCOSE, CAPILLARY
Glucose-Capillary: 112 mg/dL — ABNORMAL HIGH (ref 70–99)
Glucose-Capillary: 118 mg/dL — ABNORMAL HIGH (ref 70–99)
Glucose-Capillary: 123 mg/dL — ABNORMAL HIGH (ref 70–99)
Glucose-Capillary: 126 mg/dL — ABNORMAL HIGH (ref 70–99)

## 2021-01-14 NOTE — Progress Notes (Signed)
     Assessment & Plan: POD#3 status post robotic right colectomy - Dr. Johney Maine  Full liquid diet  Ambulate in halls  Pain control  Patient is doing well.  Passing flatus but no BM yet.  Encouraged to ambulate in halls today.  Will remain on full liquid diet.        Armandina Gemma, MD       [facility]       Office: [phone]   Chief Complaint: Colon polyp  Subjective: Patient in bed, comfortable.  Taking po liquids.  Passing flatus.  No BM. Mild pain.  Objective: Vital signs in last 24 hours: Temp:  [97.5 F (36.4 C)-98.4 F (36.9 C)] 97.5 F (36.4 C) (07/16 0700) Pulse Rate:  [94-110] 104 (07/16 0700) Resp:  [17-18] 17 (07/16 0700) BP: (144-170)/(75-81) 144/80 (07/16 0700) SpO2:  [94 %-97 %] 97 % (07/16 0700) Last BM Date: 01/10/21  Intake/Output from previous day: 07/15 0701 - 07/16 0700 In: 1200 [P.O.:1200] Out: 300 [Urine:300] Intake/Output this shift: Total I/O In: 240 [P.O.:240] Out: 0   Physical Exam: HEENT - sclerae clear, mucous membranes moist Neck - soft Abdomen - moderate distension; few BS present; wounds dry and intact Ext - no edema, non-tender Neuro - alert & oriented, no focal deficits  Lab Results:  Recent Labs    01/12/21 0441  WBC 14.1*  HGB 12.5*  HCT 39.0  PLT 198   BMET Recent Labs    01/12/21 0441  NA 137  K 4.2  CL 109  CO2 23  GLUCOSE 177*  BUN 15  CREATININE 1.30*  CALCIUM 8.1*   PT/INR No results for input(s): LABPROT, INR in the last 72 hours. Comprehensive Metabolic Panel:    Component Value Date/Time   NA 137 01/12/2021 0441   NA 135 01/04/2021 0826   K 4.2 01/12/2021 0441   K 4.4 01/04/2021 0826   CL 109 01/12/2021 0441   CL 102 01/04/2021 0826   CO2 23 01/12/2021 0441   CO2 24 01/04/2021 0826   BUN 15 01/12/2021 0441   BUN 18 01/04/2021 0826   CREATININE 1.30 (H) 01/12/2021 0441   CREATININE 0.99 01/04/2021 0826   GLUCOSE 177 (H) 01/12/2021 0441   GLUCOSE 129 (H) 01/04/2021  0826   CALCIUM 8.1 (L) 01/12/2021 0441   CALCIUM 8.9 01/04/2021 0826   AST 17 07/05/2020 0937   AST 23 08/16/2015 0655   ALT 22 07/05/2020 0937   ALT 61 08/16/2015 0655   ALKPHOS 148 (H) 07/05/2020 0937   ALKPHOS 152 (H) 08/16/2015 0655   BILITOT 0.5 07/05/2020 0937   BILITOT 0.9 08/16/2015 0655   PROT 7.9 07/05/2020 0937   PROT 7.3 08/16/2015 0655   ALBUMIN 4.2 07/05/2020 0937   ALBUMIN 2.9 (L) 08/21/2015 0532    Studies/Results: No results found.    Armandina Gemma 01/14/2021   Patient ID: Micheal Gomez, male   DOB: 1956/02/16, 66 y.o.   MRN: [id number]

## 2021-01-15 LAB — GLUCOSE, CAPILLARY
Glucose-Capillary: 95 mg/dL (ref 70–99)
Glucose-Capillary: 135 mg/dL — ABNORMAL HIGH (ref 70–99)
Glucose-Capillary: 95 mg/dL (ref 70–99)
Glucose-Capillary: 116 mg/dL — ABNORMAL HIGH (ref 70–99)

## 2021-01-15 NOTE — Progress Notes (Signed)
Assessment & Plan: POD#4 status post robotic right colectomy - Dr. Johney Maine             Full liquid diet - advance to carb modified diet this AM             Ambulate in halls             Pain control   Patient is doing well.  Passing flatus, small loose BM overnight.  Encouraged to ambulate in halls today.  Advance diet.  Patient wishes to wait until tomorrow for discharge.                                                                           Armandina Gemma, MD                                                                         [facility]                                                                         Office: [phone]  Chief Complaint: Colon polyp  Subjective: Patient in bed, comfortable.  Complains of abd tightness, distension after eating.  Denies nausea or emesis.  Passing flatus, one loose BM.  Objective: Vital signs in last 24 hours: Temp:  [97.4 F (36.3 C)-98.4 F (36.9 C)] 98 F (36.7 C) (07/17 0619) Pulse Rate:  [96-107] 100 (07/17 0619) Resp:  [18] 18 (07/17 0619) BP: (148-168)/(74-81) 148/81 (07/17 0619) SpO2:  [96 %-97 %] 96 % (07/17 0619) Weight:  [119.1 kg] 119.1 kg (07/17 0500) Last BM Date: 01/10/21  Intake/Output from previous day: 07/16 0701 - 07/17 0700 In: 1200 [P.O.:1200] Out: 450 [Urine:450] Intake/Output this shift: No intake/output data recorded.  Physical Exam: HEENT - sclerae clear, mucous membranes moist Neck - soft Abdomen - protuberant; BS present; hematoma left mid abd wall, stable, mild tender; wounds dry and intact Ext - no edema, non-tender Neuro - alert & oriented, no focal deficits  Lab Results:  No results for input(s): WBC, HGB, HCT, PLT in the last 72 hours. BMET No results for input(s): NA, K, CL, CO2, GLUCOSE, BUN, CREATININE, CALCIUM in the last 72 hours. PT/INR No results for input(s): LABPROT, INR in the last 72 hours. Comprehensive Metabolic Panel:    Component Value Date/Time    NA 137 01/12/2021 0441   NA 135 01/04/2021 0826   K 4.2 01/12/2021 0441   K 4.4 01/04/2021 0826   CL 109 01/12/2021 0441   CL 102 01/04/2021 0826   CO2 23 01/12/2021 0441   CO2 24 01/04/2021 0826   BUN 15 01/12/2021 0441   BUN 18 01/04/2021  9735   CREATININE 1.30 (H) 01/12/2021 0441   CREATININE 0.99 01/04/2021 0826   GLUCOSE 177 (H) 01/12/2021 0441   GLUCOSE 129 (H) 01/04/2021 0826   CALCIUM 8.1 (L) 01/12/2021 0441   CALCIUM 8.9 01/04/2021 0826   AST 17 07/05/2020 0937   AST 23 08/16/2015 0655   ALT 22 07/05/2020 0937   ALT 61 08/16/2015 0655   ALKPHOS 148 (H) 07/05/2020 0937   ALKPHOS 152 (H) 08/16/2015 0655   BILITOT 0.5 07/05/2020 0937   BILITOT 0.9 08/16/2015 0655   PROT 7.9 07/05/2020 0937   PROT 7.3 08/16/2015 0655   ALBUMIN 4.2 07/05/2020 0937   ALBUMIN 2.9 (L) 08/21/2015 0532    Studies/Results: No results found.    Armandina Gemma 01/15/2021   Patient ID: Micheal Gomez, male   DOB: 09/17/1955, 65 y.o.   MRN: [id number]

## 2021-01-16 ENCOUNTER — Other Ambulatory Visit (HOSPITAL_COMMUNITY): Payer: Self-pay

## 2021-01-16 ENCOUNTER — Inpatient Hospital Stay (HOSPITAL_COMMUNITY): Payer: No Typology Code available for payment source

## 2021-01-16 LAB — GLUCOSE, CAPILLARY
Glucose-Capillary: 110 mg/dL — ABNORMAL HIGH (ref 70–99)
Glucose-Capillary: 118 mg/dL — ABNORMAL HIGH (ref 70–99)
Glucose-Capillary: 120 mg/dL — ABNORMAL HIGH (ref 70–99)
Glucose-Capillary: 108 mg/dL — ABNORMAL HIGH (ref 70–99)

## 2021-01-16 MED ORDER — METOCLOPRAMIDE HCL 5 MG PO TABS
5.0000 mg | ORAL_TABLET | Freq: Three times a day (TID) | ORAL | Status: DC
  Administered 2021-01-16 – 2021-01-17 (×4): 5 mg via ORAL
  Filled 2021-01-16 (×4): qty 1

## 2021-01-16 MED ORDER — LIVING WELL WITH DIABETES BOOK
Freq: Once | Status: AC
  Filled 2021-01-16: qty 1

## 2021-01-16 NOTE — Progress Notes (Signed)
Inpatient Diabetes Program Recommendations  AACE/ADA: New Consensus Statement on Inpatient Glycemic Control (2015)  Target Ranges:  Prepandial:   less than 140 mg/dL      Peak postprandial:   less than 180 mg/dL (1-2 hours)      Critically ill patients:  140 - 180 mg/dL   Lab Results  Component Value Date   GLUCAP 118 (H) 01/16/2021   HGBA1C 7.8 (H) 11/21/2020    Review of Glycemic Control  Diabetes history: DM2 Outpatient Diabetes medications: Levemir 30 units BID, Novolog 30 units BID, metformin 1000 mg BID, Ozempic 1 mg weekly on Sat Current orders for Inpatient glycemic control: Levemir 30 units BID, Novolog 0-20 units TID with meals and 0-5 HS  HgbA1C - 7.8% CBGs 110, 118 mg/dL Eating 100% today  Inpatient Diabetes Program Recommendations:    Agree with orders.  Will speak with pt this afternoon about his diabetes and possible gastroparesis.   Continue to follow.  Thank you. Lorenda Peck, RD, LDN, CDE Inpatient Diabetes Coordinator [phone]

## 2021-01-16 NOTE — Progress Notes (Addendum)
Micheal Gomez [id number] 1955/12/18  CARE TEAM:  [facility]  Outpatient Care Team: Patient Care Team: [facility] as PCP - [name], MD as Consulting Physician (Colon and Rectal Surgery) [name], MD as Attending Physician (Gastroenterology)  Inpatient Treatment Team: Treatment Team: Attending Provider: [name], MD; Social Worker: Merri Brunette; Registered Nurse: Nelida Meuse, RN; Technician: Heber Hamilton, [place]; Utilization Review: Beau Fanny, RN; Registered Nurse: Dorinda Hill, RN; Pharmacist: Dimple Nanas, [facility]; Charge Nurse: Orlena Sheldon, RN   Problem List:   Principal Problem:   Colon mass s/p robotic proximal colectomy 01/11/2021 Active Problems:   Insulin-requiring or dependent type II diabetes mellitus ([facility])   OSA on CPAP   Obesity hypoventilation syndrome (HCC)   Severe obesity (BMI 35.0-39.9) with comorbidity (HCC)   High cholesterol   Hypertension   CKD (chronic kidney disease) stage 2, GFR 60-89 ml/min   GERD (gastroesophageal reflux disease)   5 Days Post-Op  01/11/2021  POST-OPERATIVE DIAGNOSIS:  Transverse colon polyp unresectable by colonscopy   PROCEDURE:   XI ROBOT ASSISTED COLECTOMY PROXIMAL OMENTOPEXY TRANSVERSUS ABDOMINIS PLANE (TAP) BLOCK - BILATERAL   SURGEON:  Adin Hector, MD   Assessment  Recovering relatively well  [facility] Stay = 5 days)  Plan:  Enhanced recovery pathway  Episode of emesis of uncertain etiology.  He does not seem obstructed.  With his brittle diabetes insulin requirement, I would worry about gastroparesis.  We will do a trial of Reglan to see if that helps turn things around.  Do x-rays of abdomen to see if he has true ileus or not  Medlock fluids  Diabetic control with sliding scale insulin.  Baseline Levemir.  Hold off on oral hypoglycemic  Hypertension control.  As needed backup  Follow-up on pathology  notes an adenomatous polyp without any evidence of malignancy.  Copy left with patient.  History of ulcers.  Omeprazole.  GERD.  As needed Maalox backup.  Sleep apnea.  CPAP at night  VTE prophylaxis- SCDs, etc.  enoxaparin  mobilize as tolerated to help recovery  Disposition:  Disposition:  The patient is from: Home  Anticipate discharge to:  Home  Anticipated Date of Discharge is:  January 19,2022    Barriers to discharge:  Pending Clinical improvement (more likely than not)  Patient currently is NOT MEDICALLY STABLE for discharge from the hospital from a surgery standpoint.      20 minutes spent in review, evaluation, examination, counseling, and coordination of care.   I have reviewed this patient's available data, including medical history, events of note, physical examination and test results as part of my evaluation.  A significant portion of that time was spent in counseling.  Care during the described time interval was provided by me.  01/16/2021    Subjective: (Chief complaint)  Patient felt okay but wished to stay through the weekend.  Advanced on diet.  Vomited once last night.  Feels better now.  Walking hallways.  Feels like he is passing gas with a small bowel movement.  Objective:  Vital signs:  Vitals:   01/15/21 0619 01/15/21 1310 01/15/21 2055 01/16/21 0507  BP: (!) 148/81 (!) 129/58 (!) 160/82 (!) 152/86  Pulse: 100 98 (!) 110 (!) 110  Resp: 18 18 18 18   Temp: 98 F (36.7 C) 97.6 F (36.4 C) 97.9 F (36.6 C) 97.7 F (36.5 C)  TempSrc: Oral  Oral Oral  SpO2: 96% 96% 96% 96%  Weight:  Height:        Last BM Date: 01/16/21  Intake/Output   Yesterday:  07/17 0701 - 07/18 0700 In: 1290 [P.O.:1290] Out: -  This shift:  No intake/output data recorded.  Bowel function:  Flatus: YES  BM:  YES  Drain: (No drain)   Physical Exam:  General: Pt awake/alert in no acute distress.  Smiling.  Bright and alert. Eyes: PERRL,  normal EOM.  Sclera clear.  No icterus Neuro: CN II-XII intact w/o focal sensory/motor deficits. Lymph: No head/neck/groin lymphadenopathy Psych:  No delerium/psychosis/paranoia.  Oriented x 4 HENT: Normocephalic, Mucus membranes moist.  No thrush Neck: Supple, No tracheal deviation.  No obvious thyromegaly Chest: No pain to chest wall compression.  Good respiratory excursion.  No audible wheezing CV:  Pulses intact.  Regular rhythm.  No major extremity edema MS: Normal AROM mjr joints.  No obvious deformity  Abdomen: Soft.  Mildy distended.  Nontender.  Incisions clean dry and intact.  No evidence of peritonitis.  No incarcerated hernias.  Ext:   No deformity.  No mjr edema.  No cyanosis Skin: No petechiae / purpurea.  No major sores.  Warm and dry    Results:   Cultures: No results found for this or any previous visit (from the past 720 hour(s)).  Labs: Results for orders placed or performed during the hospital encounter of 01/11/21 (from the past 48 hour(s))  Glucose, capillary     Status: Abnormal   Collection Time: 01/14/21 11:52 AM  Result Value Ref Range   Glucose-Capillary 118 (H) 70 - 99 mg/dL    Comment: Glucose reference range applies only to samples taken after fasting for at least 8 hours.  Glucose, capillary     Status: Abnormal   Collection Time: 01/14/21  4:42 PM  Result Value Ref Range   Glucose-Capillary 123 (H) 70 - 99 mg/dL    Comment: Glucose reference range applies only to samples taken after fasting for at least 8 hours.  Glucose, capillary     Status: Abnormal   Collection Time: 01/14/21  9:27 PM  Result Value Ref Range   Glucose-Capillary 126 (H) 70 - 99 mg/dL    Comment: Glucose reference range applies only to samples taken after fasting for at least 8 hours.  Glucose, capillary     Status: None   Collection Time: 01/15/21  7:49 AM  Result Value Ref Range   Glucose-Capillary 95 70 - 99 mg/dL    Comment: Glucose reference range applies only to  samples taken after fasting for at least 8 hours.  Glucose, capillary     Status: Abnormal   Collection Time: 01/15/21 11:31 AM  Result Value Ref Range   Glucose-Capillary 135 (H) 70 - 99 mg/dL    Comment: Glucose reference range applies only to samples taken after fasting for at least 8 hours.  Glucose, capillary     Status: None   Collection Time: 01/15/21  5:20 PM  Result Value Ref Range   Glucose-Capillary 95 70 - 99 mg/dL    Comment: Glucose reference range applies only to samples taken after fasting for at least 8 hours.  Glucose, capillary     Status: Abnormal   Collection Time: 01/15/21  8:53 PM  Result Value Ref Range   Glucose-Capillary 116 (H) 70 - 99 mg/dL    Comment: Glucose reference range applies only to samples taken after fasting for at least 8 hours.  Glucose, capillary     Status: Abnormal   Collection  Time: 01/16/21  7:31 AM  Result Value Ref Range   Glucose-Capillary 110 (H) 70 - 99 mg/dL    Comment: Glucose reference range applies only to samples taken after fasting for at least 8 hours.    Imaging / Studies: No results found.  Medications / Allergies: per chart  Antibiotics: Anti-infectives (From admission, onward)    Start     Dose/Rate Route Frequency Ordered Stop   01/12/21 0600  cefoTEtan (CEFOTAN) 2 g in sodium chloride 0.9 % 100 mL IVPB        2 g 200 mL/hr over 30 Minutes Intravenous Every 12 hours 01/11/21 2249 01/12/21 1723   01/11/21 1400  neomycin (MYCIFRADIN) tablet 1,000 mg  Status:  Discontinued       See Hyperspace for full Linked Orders Report.   1,000 mg Oral 3 times per day 01/11/21 1145 01/11/21 1151   01/11/21 1400  metroNIDAZOLE (FLAGYL) tablet 1,000 mg  Status:  Discontinued       See Hyperspace for full Linked Orders Report.   1,000 mg Oral 3 times per day 01/11/21 1145 01/11/21 1151   01/11/21 0600  cefoTEtan (CEFOTAN) 2 g in sodium chloride 0.9 % 100 mL IVPB        2 g 200 mL/hr over 30 Minutes Intravenous On call to O.R.  01/10/21 0826 01/11/21 1804         Note: Portions of this report may have been transcribed using voice recognition software. Every effort was made to ensure accuracy; however, inadvertent computerized transcription errors may be present.   Any transcriptional errors that result from this process are unintentional.    Adin Hector, MD, FACS, MASCRS Esophageal, Gastrointestinal & Colorectal Surgery Robotic and Minimally Invasive Surgery  [facility], [place]  [place]. [address], [place], [place] [address] [phone] Fax [phone] Main  CONTACT INFORMATION:  Weekday (9AM-5PM): Call [facility] main office at [phone]  Weeknight (5PM-9AM) or Weekend/Holiday: Check [email or web address] (password " [email or web address]") for General Surgery [facility] coverage  (Please, do not use SecureChat as it is not reliable communication to operating surgeons for immediate patient care)      01/16/2021  7:50 AM

## 2021-01-17 ENCOUNTER — Ambulatory Visit: Payer: No Typology Code available for payment source | Admitting: Orthopaedic Surgery

## 2021-01-17 DIAGNOSIS — E1143 Type 2 diabetes mellitus with diabetic autonomic (poly)neuropathy: Secondary | ICD-10-CM

## 2021-01-17 LAB — CBC
HCT: 36.1 % — ABNORMAL LOW (ref 39.0–52.0)
Hemoglobin: 11.5 g/dL — ABNORMAL LOW (ref 13.0–17.0)
MCH: 26.6 pg (ref 26.0–34.0)
MCHC: 31.9 g/dL (ref 30.0–36.0)
MCV: 83.6 fL (ref 80.0–100.0)
Platelets: 219 10*3/uL (ref 150–400)
RBC: 4.32 MIL/uL (ref 4.22–5.81)
RDW: 15.6 % — ABNORMAL HIGH (ref 11.5–15.5)
WBC: 11.6 10*3/uL — ABNORMAL HIGH (ref 4.0–10.5)
nRBC: 0 % (ref 0.0–0.2)

## 2021-01-17 LAB — PHOSPHORUS: Phosphorus: 4 mg/dL (ref 2.5–4.6)

## 2021-01-17 LAB — BASIC METABOLIC PANEL
Anion gap: 8 (ref 5–15)
BUN: 15 mg/dL (ref 8–23)
CO2: 23 mmol/L (ref 22–32)
Calcium: 8.4 mg/dL — ABNORMAL LOW (ref 8.9–10.3)
Chloride: 106 mmol/L (ref 98–111)
Creatinine, Ser: 1.14 mg/dL (ref 0.61–1.24)
GFR, Estimated: >60 mL/min (ref >60)
Glucose, Bld: 93 mg/dL (ref 70–99)
Potassium: 3.9 mmol/L (ref 3.5–5.1)
Sodium: 137 mmol/L (ref 135–145)

## 2021-01-17 LAB — MAGNESIUM: Magnesium: 1.9 mg/dL (ref 1.7–2.4)

## 2021-01-17 LAB — GLUCOSE, CAPILLARY: Glucose-Capillary: 83 mg/dL (ref 70–99)

## 2021-01-17 MED ORDER — METOCLOPRAMIDE HCL 5 MG PO TABS: 5.0000 mg | ORAL_TABLET | Freq: Three times a day (TID) | ORAL | 1 refills | Status: DC | PRN

## 2021-01-17 NOTE — Progress Notes (Signed)
Discharge instructions given to patient and all questions were answered.  

## 2021-01-17 NOTE — Progress Notes (Signed)
Inpatient Diabetes Program Recommendations  AACE/ADA: New Consensus Statement on Inpatient Glycemic Control (2015)  Target Ranges:  Prepandial:   less than 140 mg/dL      Peak postprandial:   less than 180 mg/dL (1-2 hours)      Critically ill patients:  140 - 180 mg/dL   Lab Results  Component Value Date   GLUCAP 83 01/17/2021   HGBA1C 7.8 (H) 11/21/2020    Review of Glycemic Control   Spoke with pt about his diabetes control and HgbA1C of 7.8%. Pt states he checks his blood sugars at least 3x/day and has no issues in obtaining his insulin and supplies. Encouraged him to continue working to obtain HgbA1C of 7%. Pt thought 83 was a low blood sugar and this Coordinator explained that hypoglycemia is a blood sugar of 70 or below. Stated that he may feel hypoglycemic, if his body is used to running up in the 200s. Discussed hypoglycemia s/s and treatment. Answered questions and pt will f/u with PCP.   Thank you. Lorenda Peck, RD, LDN, CDE Inpatient Diabetes Coordinator [phone]

## 2021-01-17 NOTE — Discharge Summary (Signed)
Physician Discharge Summary    Patient ID: Micheal Gomez MRN: [id number] DOB/AGE: [birth year]  66 y.o.  Patient Care Team: Administration, Veterans as PCP - [name], MD as Consulting Physician (Colon and Rectal Surgery) [name], MD as Attending Physician (Gastroenterology)  Admit date: 01/11/2021  Discharge date: 01/17/2021  Hospital Stay = 6 days    Discharge Diagnoses:  Principal Problem:   Colon mass s/p robotic proximal colectomy 01/11/2021 Active Problems:   Insulin-requiring or dependent type II diabetes mellitus ([place])   OSA on CPAP   Obesity hypoventilation syndrome (HCC)   Severe obesity (BMI 35.0-39.9) with comorbidity (HCC)   High cholesterol   Hypertension   CKD (chronic kidney disease) stage 2, GFR 60-89 ml/min   GERD (gastroesophageal reflux disease)   Diabetic gastroparesis associated with type 2 diabetes mellitus ([place])   6 Days Post-Op  01/11/2021  POST-OPERATIVE DIAGNOSIS:  Transverse colon polyp unresectable by colonscopy   PROCEDURE:   XI ROBOT ASSISTED COLECTOMY PROXIMAL OMENTOPEXY TRANSVERSUS ABDOMINIS PLANE (TAP) BLOCK - BILATERAL   SURGEON:  Adin Hector, MD  OR FINDINGS:    CASE DATA:   Type of patient?: Elective [facility] Private Case   Status of Case? Elective Scheduled   Infection Present At Time Of Surgery (PATOS)?  NO   Patient had tattooing in his proximal/mid transverse colon.  Lobulated atypical transverse colon polyp at the tattoo site.  Monitor adhesions from prior appendectomy   No obvious metastatic disease on visceral parietal peritoneum or liver.   It is an isoperistaltic ileocolonic anastomosis that rests in the epigastric region.  SURGICAL PATHOLOGY   CASE: [phone]  PATIENT: Micheal Gomez  Surgical Pathology Report      Clinical History: Colon polyp unresectable by colonoscopy (crm)      FINAL MICROSCOPIC DIAGNOSIS:   A. COLON, PROXIMAL RIGHT, RESECTION:  - Tubular  adenoma, 2.1 cm.  - No high-grade dysplasia or invasive carcinoma.  - Margins not involved.  - Twenty lymph nodes negative for metastatic carcinoma (0/20).    Adreonna Yontz DESCRIPTION:   The specimen is received fresh labeled right proximal colon, and  consists of a segment of colon with 2 stapled resection margins.  There  is 8.0 cm of terminal ileum and 33.5 cm of right colon.  The serosal  surface is tan-pink with attached yellow-red adipose tissue.  The distal  end of the specimen is received previously opened.  The lumen contains a  moderate amount of tan-brown fecal material.  Mucosa is tan-pink with  normal folding, and within the distal colon there is a 2.1 x 1.5 x 0.9  cm tan sessile polyp identified.  The surrounding mucosa displays  blue-gray discoloration, grossly consistent with tattoo powder.  The  polyp measures 24.0 cm to the proximal margin, 4.7 cm to the distal  margin, and 7.5 cm to the radial margin.  Sectioning the polyp reveals a  tan-pink cut surface, and no Lyndon Chenoweth invasion is identified.  The appendix  is not present.  25 tan-pink to gray possible lymph nodes are  identified, ranging from 0.1 to 1.0 x 0.8 x 0.8 cm.  Representative  sections are submitted in 14 cassettes.  1 = proximal margin  2 = distal margin  3-6 = polyp, entirely submitted  7 = grossly uninvolved mucosa  8-10 = 5 possible lymph nodes, each  11 = 4 possible lymph nodes  12 = 3 possible lymph nodes  13 = 2 possible lymph nodes  14 = 1 possible  lymph node, bisected Craig Staggers 01/12/2021)     Final Diagnosis performed by Claudette Laws, MD.   Electronically signed  01/13/2021  Technical component performed at [facility], [place]  [address], [place], [place] [address].   Professional component performed at [employer]. [facility],  [place] [address] [place], [place] [address].   Immunohistochemistry Technical component (if applicable) was performed  at [facility]. [address], [place],  [place], [place] [address].   IMMUNOHISTOCHEMISTRY DISCLAIMER (if applicable):  Some of these immunohistochemical stains may have been developed and the  performance characteristics determine by [facility]. Some  may not have been cleared or approved by the U.S. Food and Drug  Administration. The FDA has determined that such clearance or approval  is not necessary. This test is used for clinical purposes. It should not  be regarded as investigational or for research. This laboratory is  certified under the [place]  (CLIA-88) as qualified to perform high complexity clinical laboratory  testing.  The controls stained appropriately.   Consults: None  Hospital Course:   The patient underwent the surgery above.  Postoperatively, the patient gradually mobilized and advanced to a solid diet.  Pain and other symptoms were treated aggressively.   Patient was placed on scheduled insulin with an aggressive sliding scale.  Advancing his diet.  He struggled with some nausea and vomiting without any evidence of bowel obstruction or ileus or perforation.  Diabetic gastroparesis suspected.  Placed on oral metoclopramide/Reglan.  Symptoms improved.  By the time of discharge, the patient was walking well the hallways, eating food, having flatus.  Pain was well-controlled on an oral medications.  Based on meeting discharge criteria and continuing to recover, I felt it was safe for the patient to be discharged from the hospital to further recover with close followup. Postoperative recommendations were discussed in detail.  They are written as well.  Discharged Condition: good  Discharge Exam: Blood pressure 130/76, pulse 100, temperature (!) 97.5 F (36.4 C), temperature source Oral, resp. rate 18, height $RemoveBe'5\' 9"'[id number]$  (1.753 m), weight 119.1 kg, SpO2 97 %.  General: Pt awake/alert/oriented x4 in No acute distress Eyes:  PERRL, normal EOM.  Sclera clear.  No icterus Neuro: CN II-XII intact w/o focal sensory/motor deficits. Lymph: No head/neck/groin lymphadenopathy Psych:  No delerium/psychosis/paranoia HENT: Normocephalic, Mucus membranes moist.  No thrush Neck: Supple, No tracheal deviation Chest: No chest wall pain w good excursion CV:  Pulses intact.  Regular rhythm MS: Normal AROM mjr joints.  No obvious deformity Abdomen: Soft.  Nondistended.  Nontender.  No evidence of peritonitis.  No incarcerated hernias. Ext:  SCDs BLE.  No mjr edema.  No cyanosis Skin: No petechiae / purpura   Disposition:    Follow-up Information     [name], MD. Schedule an appointment as soon as possible for a visit in 3 week(s).   Specialties: General Surgery, Colon and Rectal Surgery Why: To follow up after your operation Contact information: [place] [address] [phone]         Administration, Veterans. Schedule an appointment as soon as possible for a visit in 1 month(s).   Why: To follow up after your hospital stay Contact information: [place] [address] [phone]                 Discharge disposition: 01-Home or Self Care  Discharge Instructions     Call MD for:   Complete by: As directed    FEVER > 101.5 F (Temperatures <101.10F can occasionally happen and are not significant)   Call MD for:  extreme fatigue   Complete by: As directed    Call MD for:  persistant dizziness or light-headedness   Complete by: As directed    Call MD for:  persistant nausea and vomiting   Complete by: As directed    Call MD for:  redness, tenderness, or signs of infection (pain, swelling, redness, odor or green/yellow discharge around incision site)   Complete by: As directed    Call MD for:  severe uncontrolled pain   Complete by: As directed    Diet - low sodium heart healthy   Complete by: As directed    Follow a light diet the first  few days at home.    If you feel full, bloated, or constipated, stay on a liquid diet until you feel better and not constipated. Gradually get back to a solid diet.  Avoid fast food or heavy meals the first week as you are more likely to get nauseated. It is expected for your digestive tract to need a few months to get back to normal.   Discharge wound care:   Complete by: As directed    It is good for closed incisions and even open wounds to be washed every day.  Shower every day.  Short baths are fine.  Wash the incisions and wounds clean with soap & water.    You may leave closed incisions open to air if it is dry.   You may cover the incision with clean gauze & replace it after your daily shower for comfort.   Driving Restrictions   Complete by: As directed    You may drive when you are no longer taking narcotic prescription pain medication, you can comfortably wear a seatbelt, and you can safely make sudden turns/stops to protect yourself without hesitating due to pain.   Increase activity slowly   Complete by: As directed    Lifting restrictions   Complete by: As directed    Start light daily activities --- self-care, walking, climbing stairs- beginning the day after surgery.   Gradually increase activities as tolerated.   Control your pain to be active.   Stop when you are tired.   Ideally, walk several times a day, eventually an hour a day.   Most people are back to most day-to-day activities in a few weeks.  It takes 4-8 weeks to get back to unrestricted, intense activity. If you can walk 30 minutes without difficulty, it is safe to try more intense activity such as jogging, treadmill, bicycling, low-impact aerobics, swimming, etc. Save the most intensive and strenuous activity for last (Usually 4-8 weeks after surgery) such as sit-ups, heavy lifting, contact sports, etc.   Refrain from any intense heavy lifting or straining until you are off narcotics for pain control.  You will have  off days, but things should improve week-by-week. DO NOT PUSH THROUGH PAIN.   Let pain be your guide: If it hurts to do something, don't do it.  Pain is your body warning you to avoid that activity for another week until the pain goes down.   May shower / Bathe   Complete by: As directed    May walk up steps   Complete by: As directed    Sexual Activity Restrictions   Complete by: As  directed    You may have sexual intercourse when it is comfortable. If it hurts to do something, stop.       Allergies as of 01/17/2021       Reactions   Nsaids Other (See Comments)   H/o ulcers        Medication List     TAKE these medications    amLODipine 5 MG tablet Commonly known as: NORVASC Take 1 tablet (5 mg total) by mouth daily.   aspirin EC 81 MG tablet Take 81 mg by mouth daily. Swallow whole.   atorvastatin 20 MG tablet Commonly known as: LIPITOR Take 20 mg by mouth daily.   bismuth subsalicylate 701 XB/93JQ suspension Commonly known as: PEPTO BISMOL Take 30 mLs by mouth every 6 (six) hours as needed for diarrhea or loose stools or indigestion.   blood glucose meter kit and supplies Dispense based on patient and insurance preference. Use up to four times daily as directed. (FOR ICD-9 250.00, 250.01).   doxepin 50 MG capsule Commonly known as: SINEQUAN Take 50 mg by mouth at bedtime.   glucose blood test strip Commonly known as: FREESTYLE LITE Use as instructed   hydrochlorothiazide 25 MG tablet Commonly known as: HYDRODIURIL Take 25 mg by mouth daily.   insulin aspart 100 UNIT/ML injection Commonly known as: novoLOG Inject 30 Units into the skin 2 (two) times daily before a meal.   insulin starter kit- pen needles Misc 1 kit by Other route once.   INSULIN SYRINGE .5CC/28G 28G X 1/2" 0.5 ML Misc 1 application by Does not apply route daily.   metFORMIN 500 MG 24 hr tablet Commonly known as: GLUCOPHAGE-XR Take 1,000 mg by mouth 2 (two) times daily.    metoCLOPramide 5 MG tablet Commonly known as: REGLAN Take 1 tablet (5 mg total) by mouth every 8 (eight) hours as needed for nausea or vomiting.   omeprazole 20 MG capsule Commonly known as: PRILOSEC Take 20 mg by mouth 2 (two) times daily.   OVER THE COUNTER MEDICATION Take 2 tablets by mouth daily.   Ozempic (1 MG/DOSE) 2 MG/1.5ML Sopn Generic drug: Semaglutide (1 MG/DOSE) Inject 1 mg into the skin every Saturday.   traMADol 50 MG tablet Commonly known as: ULTRAM Take 1-2 tablets (50-100 mg total) by mouth every 6 (six) hours as needed for moderate pain or severe pain.       ASK your doctor about these medications    insulin detemir 100 UNIT/ML injection Commonly known as: Levemir Inject 0.16 mLs (16 Units total) into the skin daily.               Discharge Care Instructions  (From admission, onward)           Start     Ordered   01/17/21 0000  Discharge wound care:       Comments: It is good for closed incisions and even open wounds to be washed every day.  Shower every day.  Short baths are fine.  Wash the incisions and wounds clean with soap & water.    You may leave closed incisions open to air if it is dry.   You may cover the incision with clean gauze & replace it after your daily shower for comfort.   01/17/21 0806            Significant Diagnostic Studies:  Results for orders placed or performed during the hospital encounter of 01/11/21 (from the past 72 hour(s))  Glucose,  capillary     Status: Abnormal   Collection Time: 01/14/21 11:52 AM  Result Value Ref Range   Glucose-Capillary 118 (H) 70 - 99 mg/dL    Comment: Glucose reference range applies only to samples taken after fasting for at least 8 hours.  Glucose, capillary     Status: Abnormal   Collection Time: 01/14/21  4:42 PM  Result Value Ref Range   Glucose-Capillary 123 (H) 70 - 99 mg/dL    Comment: Glucose reference range applies only to samples taken after fasting for at least 8  hours.  Glucose, capillary     Status: Abnormal   Collection Time: 01/14/21  9:27 PM  Result Value Ref Range   Glucose-Capillary 126 (H) 70 - 99 mg/dL    Comment: Glucose reference range applies only to samples taken after fasting for at least 8 hours.  Glucose, capillary     Status: None   Collection Time: 01/15/21  7:49 AM  Result Value Ref Range   Glucose-Capillary 95 70 - 99 mg/dL    Comment: Glucose reference range applies only to samples taken after fasting for at least 8 hours.  Glucose, capillary     Status: Abnormal   Collection Time: 01/15/21 11:31 AM  Result Value Ref Range   Glucose-Capillary 135 (H) 70 - 99 mg/dL    Comment: Glucose reference range applies only to samples taken after fasting for at least 8 hours.  Glucose, capillary     Status: None   Collection Time: 01/15/21  5:20 PM  Result Value Ref Range   Glucose-Capillary 95 70 - 99 mg/dL    Comment: Glucose reference range applies only to samples taken after fasting for at least 8 hours.  Glucose, capillary     Status: Abnormal   Collection Time: 01/15/21  8:53 PM  Result Value Ref Range   Glucose-Capillary 116 (H) 70 - 99 mg/dL    Comment: Glucose reference range applies only to samples taken after fasting for at least 8 hours.  Glucose, capillary     Status: Abnormal   Collection Time: 01/16/21  7:31 AM  Result Value Ref Range   Glucose-Capillary 110 (H) 70 - 99 mg/dL    Comment: Glucose reference range applies only to samples taken after fasting for at least 8 hours.  Glucose, capillary     Status: Abnormal   Collection Time: 01/16/21 11:07 AM  Result Value Ref Range   Glucose-Capillary 118 (H) 70 - 99 mg/dL    Comment: Glucose reference range applies only to samples taken after fasting for at least 8 hours.  Glucose, capillary     Status: Abnormal   Collection Time: 01/16/21  4:12 PM  Result Value Ref Range   Glucose-Capillary 120 (H) 70 - 99 mg/dL    Comment: Glucose reference range applies only to  samples taken after fasting for at least 8 hours.  Glucose, capillary     Status: Abnormal   Collection Time: 01/16/21  9:49 PM  Result Value Ref Range   Glucose-Capillary 108 (H) 70 - 99 mg/dL    Comment: Glucose reference range applies only to samples taken after fasting for at least 8 hours.  Basic metabolic panel     Status: Abnormal   Collection Time: 01/17/21  3:57 AM  Result Value Ref Range   Sodium 137 135 - 145 mmol/L   Potassium 3.9 3.5 - 5.1 mmol/L   Chloride 106 98 - 111 mmol/L   CO2 23 22 - 32 mmol/L  Glucose, Bld 93 70 - 99 mg/dL    Comment: Glucose reference range applies only to samples taken after fasting for at least 8 hours.   BUN 15 8 - 23 mg/dL   Creatinine, Ser 1.14 0.61 - 1.24 mg/dL   Calcium 8.4 (L) 8.9 - 10.3 mg/dL   GFR, Estimated >60 >60 mL/min    Comment: (NOTE) Calculated using the CKD-EPI Creatinine Equation (2021)    Anion gap 8 5 - 15    Comment: Performed at [facility], [place] [address], [place], [place] [address]  CBC     Status: Abnormal   Collection Time: 01/17/21  3:57 AM  Result Value Ref Range   WBC 11.6 (H) 4.0 - 10.5 K/uL   RBC 4.32 4.22 - 5.81 MIL/uL   Hemoglobin 11.5 (L) 13.0 - 17.0 g/dL   HCT 36.1 (L) 39.0 - 52.0 %   MCV 83.6 80.0 - 100.0 fL   MCH 26.6 26.0 - 34.0 pg   MCHC 31.9 30.0 - 36.0 g/dL   RDW 15.6 (H) 11.5 - 15.5 %   Platelets 219 150 - 400 K/uL   nRBC 0.0 0.0 - 0.2 %    Comment: Performed at [facility], [place] [address], [place], [place] [address]  Magnesium     Status: None   Collection Time: 01/17/21  3:57 AM  Result Value Ref Range   Magnesium 1.9 1.7 - 2.4 mg/dL    Comment: Performed at [facility], [place] [address] [place], [place] [address]  Phosphorus     Status: None   Collection Time: 01/17/21  3:57 AM  Result Value Ref Range   Phosphorus 4.0 2.5 - 4.6 mg/dL    Comment: Performed at [facility], [place] [address] [place], [place]  [address]  Glucose, capillary     Status: None   Collection Time: 01/17/21  7:41 AM  Result Value Ref Range   Glucose-Capillary 83 70 - 99 mg/dL    Comment: Glucose reference range applies only to samples taken after fasting for at least 8 hours.    No results found.  Past Medical History:  Diagnosis Date   Acute encephalopathy    Acute renal failure ([place])    COVID-19    11-29-20   Diabetes mellitus without complication ([place])    Diabetic ketoacidosis with coma associated with type 2 diabetes mellitus ([place])    DKA (diabetic ketoacidoses) 08/05/2015   High cholesterol    History of stomach ulcers    Hypertension     Past Surgical History:  Procedure Laterality Date   APPENDECTOMY     polyp removal     TONSILLECTOMY      Social History   Socioeconomic History   Marital status: Single    Spouse name: Not on file   Number of children: Not on file   Years of education: Not on file   Highest education level: Not on file  Occupational History   Not on file  Tobacco Use   Smoking status: Former    Types: Cigarettes   Smokeless tobacco: Never  Vaping Use   Vaping Use: Never used  Substance and Sexual Activity   Alcohol use: No   Drug use: No   Sexual activity: Not on file  Other Topics Concern   Not on file  Social History Narrative   [facility]   Retired from [employer]  Strain: Not on file  Food Insecurity: Not on file  Transportation Needs: Not on file  Physical Activity: Not on file  Stress: Not on file  Social Connections: Not on file  Intimate Partner Violence: Not on file    Family History  Problem Relation Age of Onset   Diabetes Father    Hypertension Father    Colon cancer Neg Hx    Stomach cancer Neg Hx    Rectal cancer Neg Hx    Esophageal cancer Neg Hx     Current Facility-Administered Medications  Medication Dose Route Frequency Provider Last Rate Last Admin   acetaminophen (TYLENOL)  tablet 1,000 mg  1,000 mg Oral Q6H [name], MD   1,000 mg at 01/17/21 0532   alum & mag hydroxide-simeth (MAALOX/MYLANTA) 200-200-20 MG/5ML suspension 30 mL  30 mL Oral Q6H PRN [name], MD   30 mL at 01/15/21 2158   amLODipine (NORVASC) tablet 5 mg  5 mg Oral Daily [name], MD   5 mg at 01/16/21 0903   atorvastatin (LIPITOR) tablet 20 mg  20 mg Oral Daily [name], MD   20 mg at 01/16/21 0903   diphenhydrAMINE (BENADRYL) 12.5 MG/5ML elixir 12.5 mg  12.5 mg Oral Q6H PRN [name], MD       Or   diphenhydrAMINE (BENADRYL) injection 12.5 mg  12.5 mg Intravenous Q6H PRN [name], MD       doxepin (SINEQUAN) capsule 50 mg  50 mg Oral Ardeen Fillers, MD   50 mg at 01/16/21 2159   enalaprilat (VASOTEC) injection 0.625-1.25 mg  0.625-1.25 mg Intravenous Q6H PRN [name], MD       enoxaparin (LOVENOX) injection 40 mg  40 mg Subcutaneous Q24H [name], MD   40 mg at 01/16/21 0902   feeding supplement (GLUCERNA SHAKE) (GLUCERNA SHAKE) liquid 237 mL  237 mL Oral BID BM [name], MD   237 mL at 01/15/21 1440   HYDROmorphone (DILAUDID) injection 0.5-2 mg  0.5-2 mg Intravenous Q4H PRN [name], MD   1 mg at 01/13/21 0511   insulin aspart (novoLOG) injection 0-20 Units  0-20 Units Subcutaneous TID WC [name], MD   3 Units at 01/15/21 1254   insulin aspart (novoLOG) injection 0-5 Units  0-5 Units Subcutaneous QHS [name], MD       insulin detemir (LEVEMIR) injection 30 Units  30 Units Subcutaneous BID [name], MD   30 Units at 01/16/21 2200   lip balm (CARMEX) ointment 1 application  1 application Topical BID [name], MD   1 application at 17/61/60 2201   magic mouthwash  15 mL Oral QID PRN [name], MD       melatonin tablet 3 mg  3 mg Oral QHS PRN [name], MD       menthol-cetylpyridinium (CEPACOL) lozenge 3 mg  1 lozenge Oral PRN [name], MD       methocarbamol (ROBAXIN) 1,000 mg in dextrose 5 % 100 mL IVPB  1,000 mg  Intravenous Q6H PRN [name], MD       methocarbamol (ROBAXIN) tablet 1,000 mg  1,000 mg Oral Q6H PRN [name], MD   1,000 mg at 01/14/21 0645   metoCLOPramide (REGLAN) tablet 5 mg  5 mg Oral TID AC & HS [name], MD   5 mg at 01/16/21 2159   metoprolol tartrate (LOPRESSOR) injection 5 mg  5 mg Intravenous Q6H PRN [name], MD   5 mg  at 01/12/21 1722   ondansetron (ZOFRAN) tablet 4 mg  4 mg Oral Q6H PRN [name], MD       Or   ondansetron [facility]) injection 4 mg  4 mg Intravenous Q6H PRN [name], MD       pantoprazole (PROTONIX) EC tablet 40 mg  40 mg Oral Daily [name], MD   40 mg at 01/16/21 0903   phenol (CHLORASEPTIC) mouth spray 2 spray  2 spray Mouth/Throat PRN [name], MD       polycarbophil (FIBERCON) tablet 625 mg  625 mg Oral BID [name], MD   625 mg at 01/16/21 2159   prochlorperazine (COMPAZINE) tablet 10 mg  10 mg Oral Q6H PRN [name], MD       Or   prochlorperazine (COMPAZINE) injection 5-10 mg  5-10 mg Intravenous Q6H PRN [name], MD       traMADol Veatrice Bourbon) tablet 50-100 mg  50-100 mg Oral Q6H PRN [name], MD   100 mg at 01/13/21 0353     Allergies  Allergen Reactions   Nsaids Other (See Comments)    H/o ulcers     Signed: Morton Peters, MD, FACS, MASCRS Esophageal, Gastrointestinal & Colorectal Surgery Robotic and Minimally Invasive Surgery  [facility], [place]  [place]. [address], [place], [place] [address] [phone] Fax [phone] Main  CONTACT INFORMATION:  Weekday (9AM-5PM): Call [facility] main office at [phone]  Weeknight (5PM-9AM) or Weekend/Holiday: Check [email or web address] (password " [email or web address]") for General Surgery [facility] coverage  (Please, do not use SecureChat as it is not reliable communication to operating surgeons for immediate patient care)      01/17/2021, 8:06 AM

## 2021-01-31 ENCOUNTER — Other Ambulatory Visit: Payer: Self-pay

## 2021-01-31 ENCOUNTER — Encounter: Payer: Self-pay | Admitting: Orthopaedic Surgery

## 2021-01-31 ENCOUNTER — Ambulatory Visit (INDEPENDENT_AMBULATORY_CARE_PROVIDER_SITE_OTHER): Payer: Medicare PPO | Admitting: Orthopaedic Surgery

## 2021-01-31 VITALS — BP 148/92 | HR 103 | Ht 69.0 in | Wt 262.0 lb

## 2021-01-31 DIAGNOSIS — S43002A Unspecified subluxation of left shoulder joint, initial encounter: Secondary | ICD-10-CM

## 2021-01-31 NOTE — Progress Notes (Signed)
Office Visit Note   Patient: Micheal Gomez           Date of Birth: May 13, 1956           MRN: [id number] Visit Date: 01/31/2021              Requested by: [facility], [facility] [address] [place],  [place] [address] PCP: [facility], Veterans   Assessment & Plan: Visit Diagnoses:  Shoulder subluxation, left.  Plan: Would recommend proceeding with arthrogram MRI scan to rule out posterior labral tear with his persistent symptoms.  He might require posterior labral stabilization.  Follow-Up Instructions: No follow-ups on file.   Orders:  Orders Placed This Encounter  Procedures   MR Shoulder Left w/ contrast   DL FLUORO GUIDED NEEDLE PLC ASPIRATION / INJECTTION/LOC   No orders of the defined types were placed in this encounter.     Procedures: No procedures performed   Clinical Data: No additional findings.   Subjective: Chief Complaint  Patient presents with   Left Shoulder - Follow-up    HPI 65 year old male returns with ongoing problems with left shoulder pain.  He states at times hitting his shoulder causes him considerable aching pain anteriorly.  He can get his arm up over his head sometimes is painful others not.  He states he has some pain when he pushes with his arm.  CT scan showed evidence of a reverse Hill-Sachs consistent with subluxation reduction type injury.  Portion of the scan when I previously reviewed it.  That this was chronic and pre-existing injury.  Patient does have diabetes on insulin and also hypertension hyperglycemia.  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  Review of Systems all other systems noncontributory to HPI.   Objective: Vital Signs: BP (!) 148/92   Pulse (!) 103   Ht '5\' 9"'$  (1.753 m)   Wt 262 lb (118.8 kg)   BMI 38.69 kg/m   Physical Exam Constitutional:      Appearance: He is well-developed.  HENT:     Head: Normocephalic and atraumatic.     Right Ear: External ear normal.     Left Ear: External ear  normal.  Eyes:     Pupils: Pupils are equal, round, and reactive to light.  Neck:     Thyroid: No thyromegaly.     Trachea: No tracheal deviation.  Cardiovascular:     Rate and Rhythm: Normal rate.  Pulmonary:     Effort: Pulmonary effort is normal.     Breath sounds: No wheezing.  Abdominal:     General: Bowel sounds are normal.     Palpations: Abdomen is soft.  Musculoskeletal:     Cervical back: Neck supple.  Skin:    General: Skin is warm and dry.     Capillary Refill: Capillary refill takes less than 2 seconds.  Neurological:     Mental Status: He is alert and oriented to person, place, and time.  Psychiatric:        Behavior: Behavior normal.        Thought Content: Thought content normal.        Judgment: Judgment normal.    Ortho Exam patient has discomfort internal and external rotation of the left shoulder.  Mild discomfort with posterior loading of the humeral head with the shoulder flexed.  Long head biceps is normal with palpation.  Good cervical range of motion no brachial plexus tenderness.  Specialty Comments:  No specialty comments available.  Imaging: No results found.  PMFS History: Patient Active Problem List   Diagnosis Date Noted   Diabetic gastroparesis associated with type 2 diabetes mellitus ([place]) 01/17/2021   Severe obesity (BMI 35.0-39.9) with comorbidity ([place]) 01/13/2021   Insulin-requiring or dependent type II diabetes mellitus ([place]) 01/13/2021   High cholesterol 01/13/2021   Hypertension 01/13/2021   History of stomach ulcers 01/13/2021   CKD (chronic kidney disease) stage 2, GFR 60-89 ml/min 01/13/2021   GERD (gastroesophageal reflux disease) 01/13/2021   Colon mass s/p robotic proximal colectomy 01/11/2021 01/11/2021   Shoulder subluxation, left 12/16/2020   Transaminitis    Hyperglycemia    Non-traumatic rhabdomyolysis    OSA on CPAP    Obesity hypoventilation syndrome ([place])    Atypical chest pain 08/26/2013   Past Medical  History:  Diagnosis Date   Acute encephalopathy    Acute renal failure ([place])    COVID-19    11-29-20   Diabetes mellitus without complication (HCC)    Diabetic ketoacidosis with coma associated with type 2 diabetes mellitus ([place])    DKA (diabetic ketoacidoses) 08/05/2015   High cholesterol    History of stomach ulcers    Hypertension     Family History  Problem Relation Age of Onset   Diabetes Father    Hypertension Father    Colon cancer Neg Hx    Stomach cancer Neg Hx    Rectal cancer Neg Hx    Esophageal cancer Neg Hx     Past Surgical History:  Procedure Laterality Date   APPENDECTOMY     polyp removal     TONSILLECTOMY     Social History   Occupational History   Not on file  Tobacco Use   Smoking status: Former    Types: Cigarettes   Smokeless tobacco: Never  Vaping Use   Vaping Use: Never used  Substance and Sexual Activity   Alcohol use: No   Drug use: No   Sexual activity: Not on file

## 2021-02-24 ENCOUNTER — Ambulatory Visit
Admission: RE | Admit: 2021-02-24 | Discharge: 2021-02-24 | Disposition: A | Discharge status: Home / Self Care | Payer: Medicare PPO | Source: Ambulatory Visit | Attending: Orthopaedic Surgery | Admitting: Orthopaedic Surgery

## 2021-02-24 ENCOUNTER — Other Ambulatory Visit: Payer: Self-pay

## 2021-02-24 DIAGNOSIS — S43002A Unspecified subluxation of left shoulder joint, initial encounter: Secondary | ICD-10-CM

## 2021-02-24 DIAGNOSIS — S42292A Other displaced fracture of upper end of left humerus, initial encounter for closed fracture: Secondary | ICD-10-CM | POA: Diagnosis not present

## 2021-02-24 DIAGNOSIS — S46012A Strain of muscle(s) and tendon(s) of the rotator cuff of left shoulder, initial encounter: Secondary | ICD-10-CM | POA: Diagnosis not present

## 2021-02-24 IMAGING — XA DG FLUORO GUIDE NDL PLC/BX
1 series · 1 of 1 positions shown · non-contrast
Comparison: none

CLINICAL DATA: Hill-Sachs lesion

EXAM:
EXAM
LEFT SHOULDER INJECTION UNDER FLUOROSCOPY FOR MRI
FLUOROSCOPY TIME:  20 seconds; 10 [3Y] DAP
TECHNIQUE: The procedure, risks (including but not limited to bleeding,
infection, organ damage ), benefits, and alternatives were explained
to the patient. Questions regarding the procedure were encouraged
and answered. The patient understands and consents to the procedure.

[Series 1: ortho adipose · 1 of 1 slices shown]
[im 1/1]
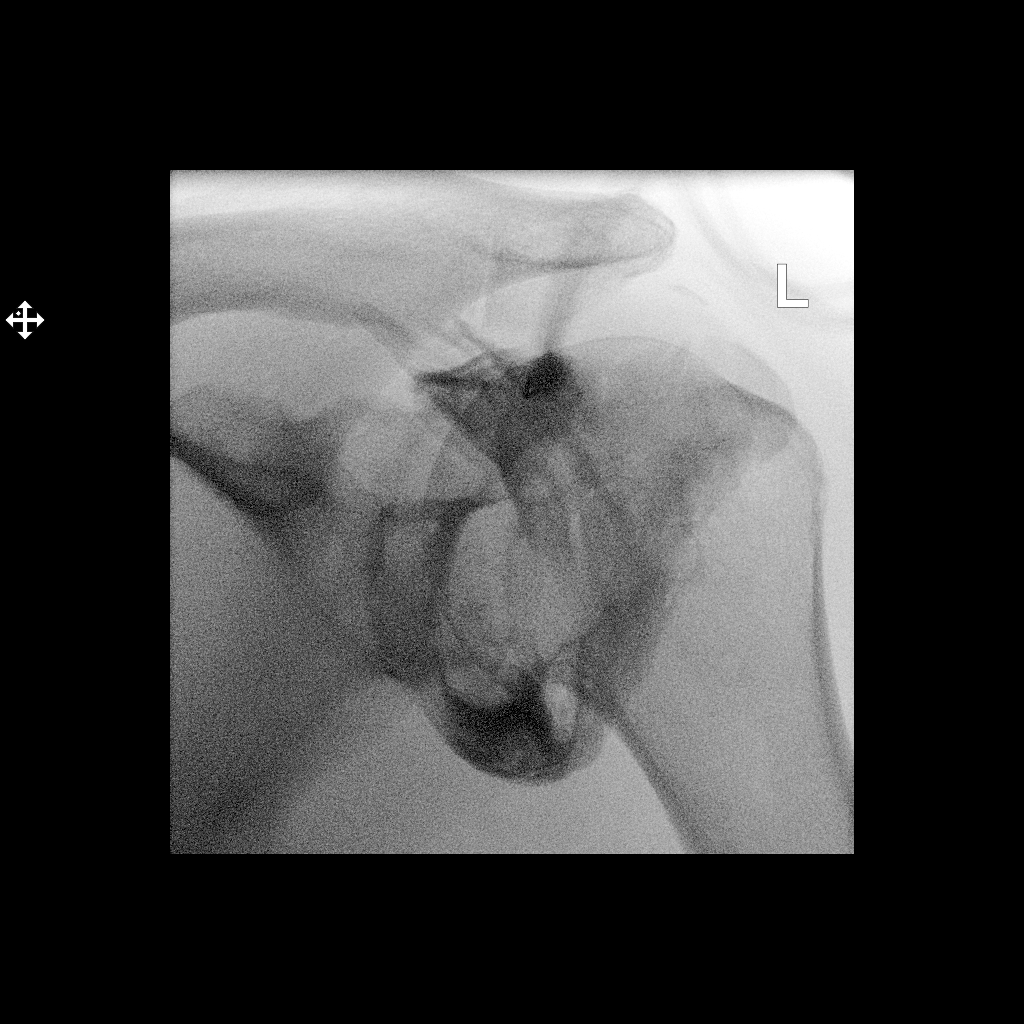

[1 of 1 positions shown; findings below may reference images not displayed]

An appropriate skin entry site was determined under fluoroscopy.
Skin site was marked, prepped with Betadine, and draped in usual
sterile fashion, and infiltrated locally with 1% lidocaine.

22-gauge spinal needle advanced to the superior medial margin of the
humeral head. 1 mL of lidocaine 1% injected easily. 12ml of a
mixture of 20 mL dilute iodinated contrast with 0.1ml Multihance
contrast was injected into the shoulder joint. Intraarticular flow
was confirmed on fluoroscopy. Patient transferred to MRI.

COMPLICATIONS:
COMPLICATIONS
none
IMPRESSION: 1. Technically successful left shoulder injection for MRI

## 2021-02-24 IMAGING — MR MR SHOULDER*L* W/ CM
6 series · 40 of 40 positions shown · IV contrast (agent unspecified)
Comparison: None.

CLINICAL DATA: Left shoulder pain.  Status post fall 10 weeks ago.

EXAM:
MR ARTHROGRAM OF THE LEFT SHOULDER
TECHNIQUE: Multiplanar, multisequence MR imaging of the left shoulder was
performed following the administration of intra-articular contrast.
CONTRAST:  See Injection Documentation.

[Series 3: T1 fat-sat · axial · 4.0mm · 0.27mm/px · z∈[-74,+22]mm · 6 of 21 slices shown (1 of 4)]
[im 1/21]
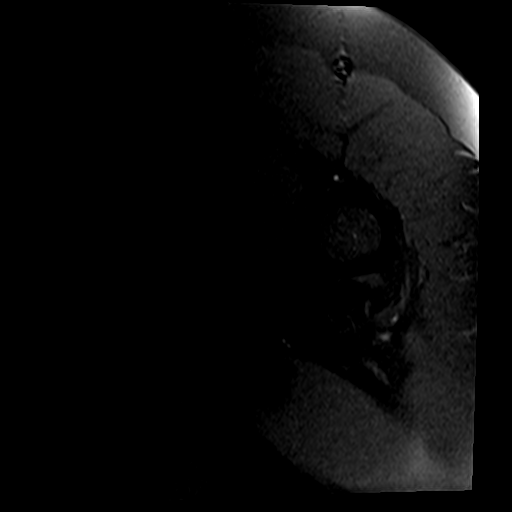
[im 5/21]
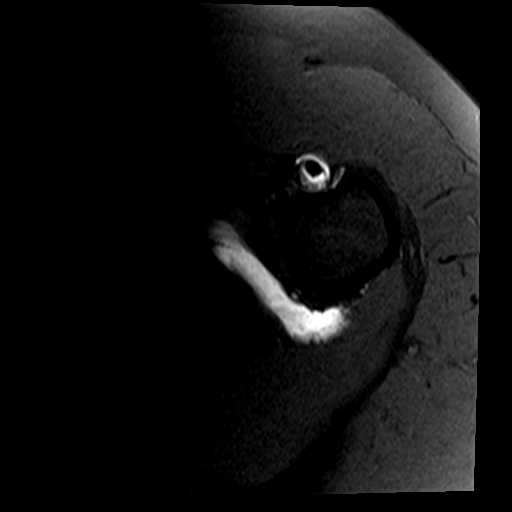
[im 9/21]
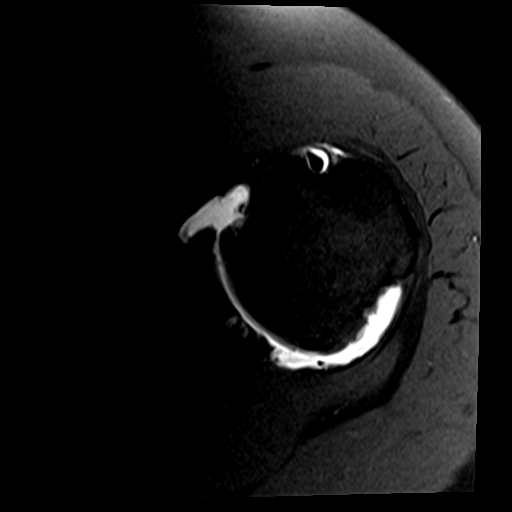
[im 13/21]
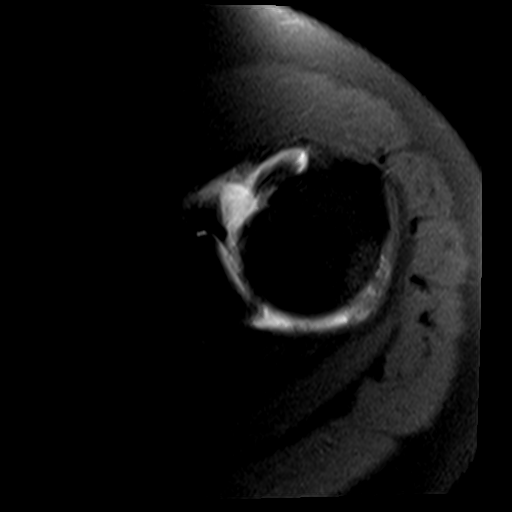
[im 17/21]
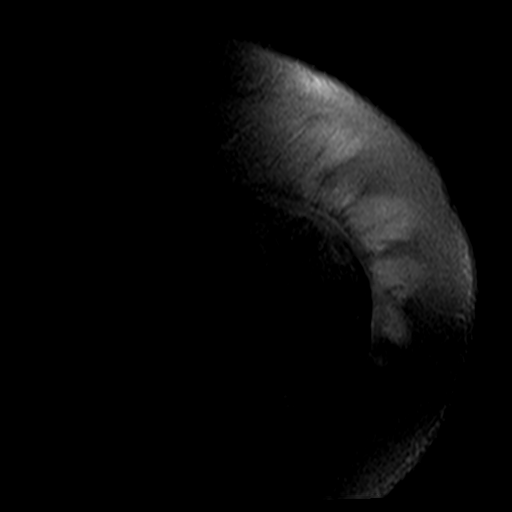
[im 21/21]
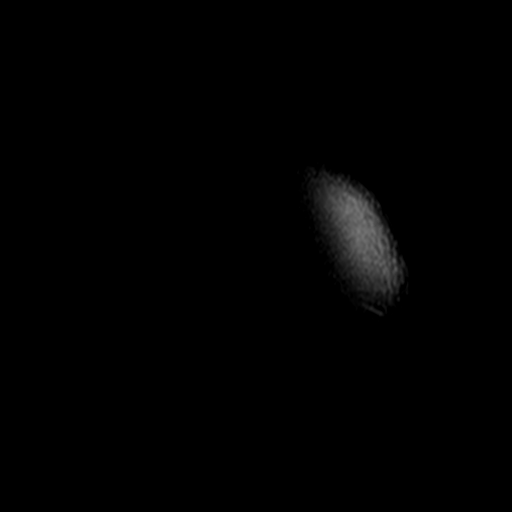

[Series 4: T2 fat-sat · oblique · 4.0mm · 0.55mm/px · 7 of 20 slices shown (1 of 2)]
[im 1/20]
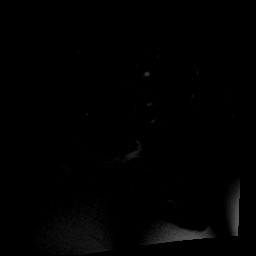
[im 4/20]
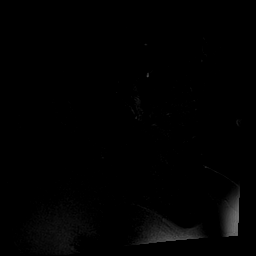
[im 7/20]
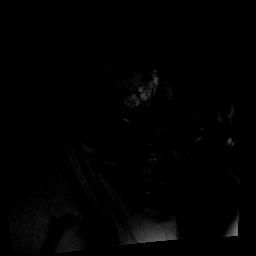
[im 10/20]
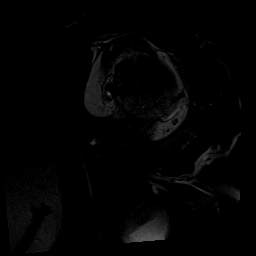
[im 13/20]
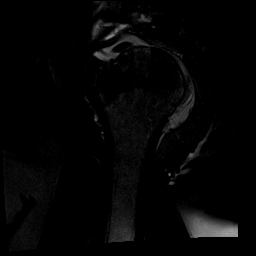
[im 16/20]
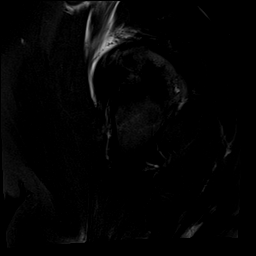
[im 20/20]
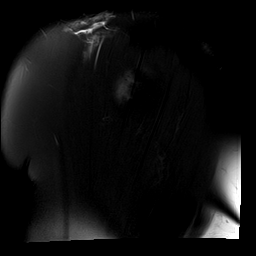

[Series 5: T1 fat-sat · oblique · 4.0mm · 0.55mm/px · 7 of 19 slices shown (2 of 4)]
[im 1/19]
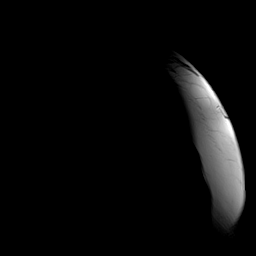
[im 4/19]
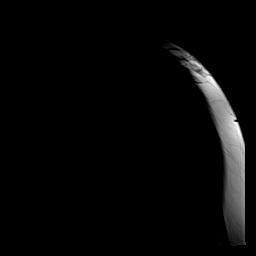
[im 7/19]
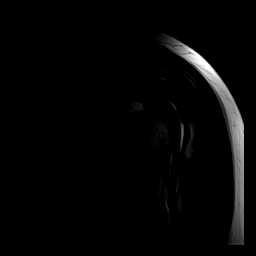
[im 10/19]
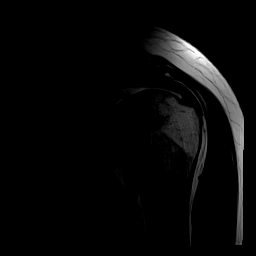
[im 13/19]
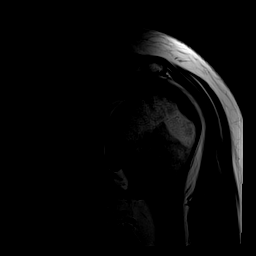
[im 16/19]
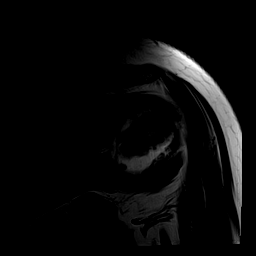
[im 19/19]
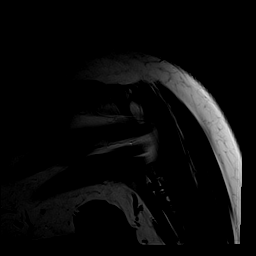

[Series 6: T1 fat-sat · oblique · 4.0mm · 0.55mm/px · 7 of 19 slices shown (3 of 4)]
[im 1/19]
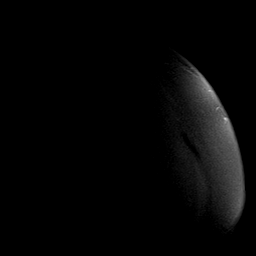
[im 4/19]
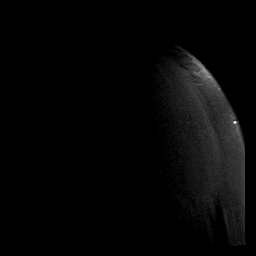
[im 7/19]
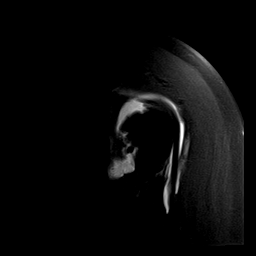
[im 10/19]
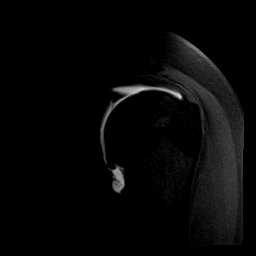
[im 13/19]
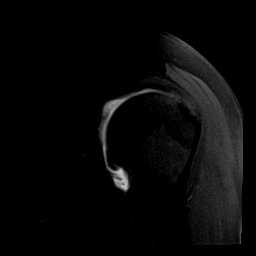
[im 16/19]
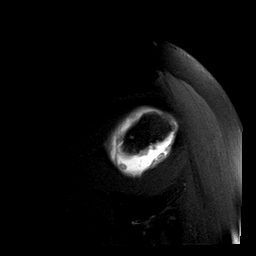
[im 19/19]
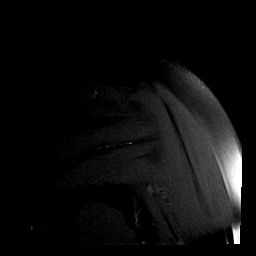

[Series 7: T2 fat-sat · oblique · 4.0mm · 0.55mm/px · 7 of 19 slices shown (2 of 2)]
[im 1/19]
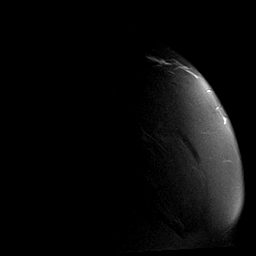
[im 4/19]
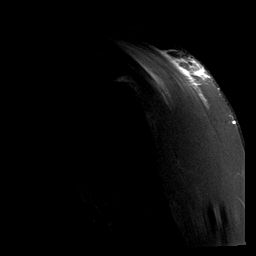
[im 7/19]
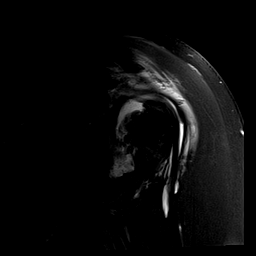
[im 10/19]
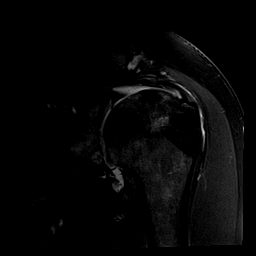
[im 13/19]
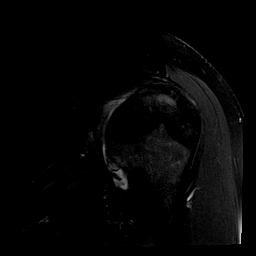
[im 16/19]
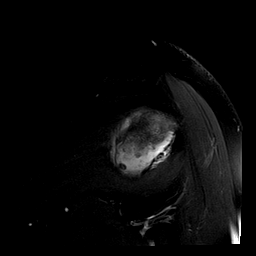
[im 19/19]
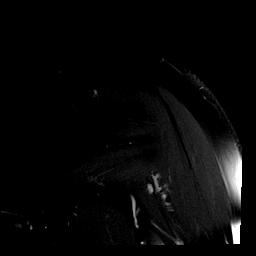

[Series 11: T1 fat-sat · sagittal · 4.0mm · 0.50mm/px · 6 of 18 slices shown (4 of 4)]
[im 1/18]
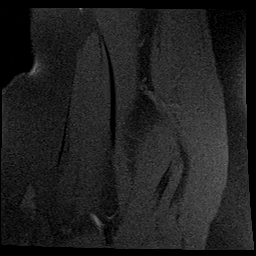
[im 4/18]
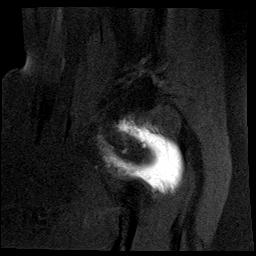
[im 7/18]
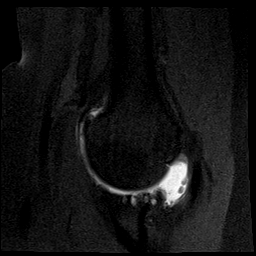
[im 11/18]
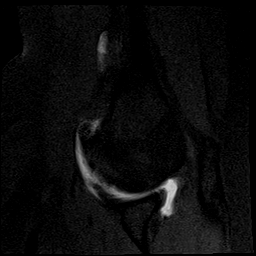
[im 14/18]
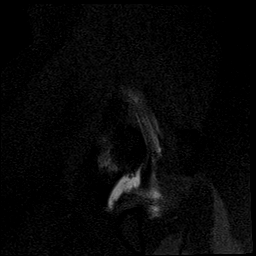
[im 18/18]
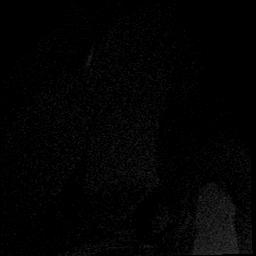

[40 of 40 positions shown; findings below may reference images not displayed]

FINDINGS: Rotator cuff: Mild tendinosis of the supraspinatus tendon.
Infraspinatus tendon is intact. Teres minor tendon is intact.
Subscapularis tendon is intact.

Muscles: No muscle atrophy or edema. No intramuscular fluid
collection or hematoma.

Biceps Long Head: Intraarticular and extraarticular portions of the
biceps tendon are intact.

Acromioclavicular Joint: Moderate arthropathy of the
acromioclavicular joint. Small amount of subacromial/subdeltoid
bursal fluid.

Glenohumeral Joint: Intraarticular contrast distending the joint
capsule. Normal glenohumeral ligaments. High-grade partial-thickness
cartilage loss of the glenohumeral joint with areas of
full-thickness cartilage loss of the posteroinferior glenoid with
subchondral cystic changes.

Labrum: Posterior labral tear.

Bones: Abnormal concavity with subcortical bone marrow edema in the
anteromedial humeral head consistent with a reverse Hill-Sachs
lesion. No other fracture or dislocation.

Other: No fluid collection or hematoma.
IMPRESSION: 1. Moderate-severe osteoarthritis of the glenohumeral joint.
2. Mild tendinosis of the supraspinatus tendon.
3. Abnormal concavity with subcortical bone marrow edema in the
anteromedial humeral head consistent with a reverse Hill-Sachs
lesion.
4. Posterior labral tear.
5. Mild subacromial/subdeltoid bursitis.

## 2021-02-24 MED ORDER — IOPAMIDOL (ISOVUE-M 200) INJECTION 41%
13.0000 mL | Freq: Once | INTRAMUSCULAR | Status: AC
  Administered 2021-02-24: 13 mL via INTRA_ARTICULAR

## 2021-03-22 ENCOUNTER — Ambulatory Visit (INDEPENDENT_AMBULATORY_CARE_PROVIDER_SITE_OTHER): Payer: No Typology Code available for payment source | Admitting: Orthopaedic Surgery

## 2021-03-22 ENCOUNTER — Encounter: Payer: Self-pay | Admitting: Orthopaedic Surgery

## 2021-03-22 ENCOUNTER — Other Ambulatory Visit: Payer: Self-pay

## 2021-03-22 VITALS — BP 169/90 | HR 99 | Ht 69.0 in | Wt 262.0 lb

## 2021-03-22 DIAGNOSIS — M19012 Primary osteoarthritis, left shoulder: Secondary | ICD-10-CM | POA: Diagnosis not present

## 2021-03-22 DIAGNOSIS — S43002A Unspecified subluxation of left shoulder joint, initial encounter: Secondary | ICD-10-CM | POA: Diagnosis not present

## 2021-03-22 NOTE — Progress Notes (Signed)
Office Visit Note   Patient: Micheal Gomez           Date of Birth: 1955/11/30           MRN: [id number] Visit Date: 03/22/2021              Requested by: [facility], [facility] [address] [place],  [place] [address] PCP: [facility], [facility]   Assessment & Plan: Visit Diagnoses:  1. Subluxation of left shoulder joint, initial encounter   2. Primary osteoarthritis, left shoulder     Plan: We will have him see Dr. Alphonzo Severance.  We discussed labral tear but with his significant osteoarthritis in the shoulder stabilization labral repair may not improve his current symptoms.  He understands some point he may require shoulder arthroplasty.  We deferred injection today in his shoulder since it might be that shoulder arthroscopic treatment may be a consideration by Dr. Marlou Sa.  Follow-Up Instructions: No follow-ups on file.   Orders:  No orders of the defined types were placed in this encounter.  No orders of the defined types were placed in this encounter.     Procedures: No procedures performed   Clinical Data: No additional findings.   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  Patient had CT scan which showed reverse Hill-Sachs with subluxation.  He has never had to have his shoulder reduced.  He played sports when he was younger.  He is a diabetic and is on insulin.  He has problems with range of motion of his shoulder and at times he states it gives him deep aching pain.  MRI scan has been obtained and is available for review.  This shows moderate to severe arthritis of the glenohumeral joint consistent with chronic repetitive subluxation and also posterior labral tear.  He has a reverse Hill-Sachs lesion seen on CT scan.  Review of Systems positive for diabetes on insulin all  other systems noncontributory to HPI.   Objective: Vital Signs: BP (!) 169/90   Pulse 99   Ht 5\' 9"  (1.753 m)   Wt 262 lb (118.8 kg)   BMI 38.69 kg/m   Physical Exam Constitutional:      Appearance: He is well-developed.  HENT:     Head: Normocephalic and atraumatic.     Right Ear: External ear normal.     Left Ear: External ear normal.  Eyes:     Pupils: Pupils are equal, round, and reactive to light.  Neck:     Thyroid: No thyromegaly.     Trachea: No tracheal deviation.  Cardiovascular:     Rate and Rhythm: Normal rate.  Pulmonary:     Effort: Pulmonary effort is normal.     Breath sounds: No wheezing.  Abdominal:     General: Bowel sounds are normal.     Palpations: Abdomen is soft.  Musculoskeletal:     Cervical back: Neck supple.  Skin:    General: Skin is warm and dry.     Capillary Refill: Capillary refill takes less than 2 seconds.  Neurological:     Mental Status: He is alert and oriented to person, place, and time.  Psychiatric:        Behavior: Behavior normal.        Thought Content: Thought content normal.  Judgment: Judgment normal.    Ortho Exam patient has discomfort with rotation of the shoulder.  Mild discomfort with posterior loading the humerus had.  No anterior instability.  Good cervical range of motion station hand is intact.  Specialty Comments:  No specialty comments available.  Imaging: CLINICAL DATA:  Left shoulder pain.  Status post fall 10 weeks ago.   EXAM: MR ARTHROGRAM OF THE LEFT SHOULDER   TECHNIQUE: Multiplanar, multisequence MR imaging of the left shoulder was performed following the administration of intra-articular contrast.   CONTRAST:  See Injection Documentation.   COMPARISON:  None.   FINDINGS: Rotator cuff: Mild tendinosis of the supraspinatus tendon. Infraspinatus tendon is intact. Teres minor tendon is intact. Subscapularis tendon is intact.   Muscles: No muscle atrophy or edema. No intramuscular  fluid collection or hematoma.   Biceps Long Head: Intraarticular and extraarticular portions of the biceps tendon are intact.   Acromioclavicular Joint: Moderate arthropathy of the acromioclavicular joint. Small amount of subacromial/subdeltoid bursal fluid.   Glenohumeral Joint: Intraarticular contrast distending the joint capsule. Normal glenohumeral ligaments. High-grade partial-thickness cartilage loss of the glenohumeral joint with areas of full-thickness cartilage loss of the posteroinferior glenoid with subchondral cystic changes.   Labrum: Posterior labral tear.   Bones: Abnormal concavity with subcortical bone marrow edema in the anteromedial humeral head consistent with a reverse Hill-Sachs lesion. No other fracture or dislocation.   Other: No fluid collection or hematoma.   IMPRESSION: 1. Moderate-severe osteoarthritis of the glenohumeral joint. 2. Mild tendinosis of the supraspinatus tendon. 3. Abnormal concavity with subcortical bone marrow edema in the anteromedial humeral head consistent with a reverse Hill-Sachs lesion. 4. Posterior labral tear. 5. Mild subacromial/subdeltoid bursitis.     Electronically Signed   By: Kathreen Devoid M.D.   On: 02/26/2021 08:41   PMFS History: Patient Active Problem List   Diagnosis Date Noted   Primary osteoarthritis, left shoulder 03/22/2021   Diabetic gastroparesis associated with type 2 diabetes mellitus ([place]) 01/17/2021   Severe obesity (BMI 35.0-39.9) with comorbidity ([place]) 01/13/2021   Insulin-requiring or dependent type II diabetes mellitus ([place]) 01/13/2021   High cholesterol 01/13/2021   Hypertension 01/13/2021   History of stomach ulcers 01/13/2021   CKD (chronic kidney disease) stage 2, GFR 60-89 ml/min 01/13/2021   GERD (gastroesophageal reflux disease) 01/13/2021   Colon mass s/p robotic proximal colectomy 01/11/2021 01/11/2021   Shoulder subluxation, left 12/16/2020   Transaminitis    Hyperglycemia     Non-traumatic rhabdomyolysis    OSA on CPAP    Obesity hypoventilation syndrome ([place])    Atypical chest pain 08/26/2013   Past Medical History:  Diagnosis Date   Acute encephalopathy    Acute renal failure ([place])    COVID-19    11-29-20   Diabetes mellitus without complication (HCC)    Diabetic ketoacidosis with coma associated with type 2 diabetes mellitus (HCC)    DKA (diabetic ketoacidoses) 08/05/2015   High cholesterol    History of stomach ulcers    Hypertension     Family History  Problem Relation Age of Onset   Diabetes Father    Hypertension Father    Colon cancer Neg Hx    Stomach cancer Neg Hx    Rectal cancer Neg Hx    Esophageal cancer Neg Hx     Past Surgical History:  Procedure Laterality Date   APPENDECTOMY     polyp removal     TONSILLECTOMY     Social History   Occupational History  Not on file  Tobacco Use   Smoking status: Former    Types: Cigarettes   Smokeless tobacco: Never  Vaping Use   Vaping Use: Never used  Substance and Sexual Activity   Alcohol use: No   Drug use: No   Sexual activity: Not on file

## 2021-04-05 ENCOUNTER — Encounter: Payer: Self-pay | Admitting: Orthopedic Surgery

## 2021-04-05 ENCOUNTER — Ambulatory Visit (INDEPENDENT_AMBULATORY_CARE_PROVIDER_SITE_OTHER): Payer: Medicare PPO | Admitting: Orthopedic Surgery

## 2021-04-05 ENCOUNTER — Other Ambulatory Visit: Payer: Self-pay

## 2021-04-05 DIAGNOSIS — M19012 Primary osteoarthritis, left shoulder: Secondary | ICD-10-CM

## 2021-04-08 ENCOUNTER — Encounter: Payer: Self-pay | Admitting: Orthopedic Surgery

## 2021-04-08 NOTE — Progress Notes (Signed)
Office Visit Note   Patient: Micheal Gomez           Date of Birth: 12-Feb-1956           MRN: [id number] Visit Date: 04/05/2021 Requested by: [facility], [facility] [address] [place],  [place] [address] PCP: [facility], [facility]  Subjective: Chief Complaint  Patient presents with   Left Shoulder - Follow-up    HPI: Micheal Gomez is a 65 y.o. male who presents to the office complaining of left shoulder pain.  Patient had a [place] injury 17 to 18 weeks ago.  He was seen at the [facility] subsequently with trials of steroids and oral medications without any relief.  Complains of lateral and axillary pain with radiation into the bicep.  No radicular pain down the entirety of the arm.  He has occasional numbness tingling to both hands about 1 time per day.  No axial neck pain.  No history of prior neck or shoulder surgery.  He has not had any cortisone injections.  He denies any history of instability of the left shoulder to the point where he is required reduction since his high school days when he was playing sports.  He does endorse history of diabetes with last A1c less than 7.  He is on insulin.  No history of CKD or cardiac history.  He takes aspirin 81 mg with no blood thinners..                ROS: All systems reviewed are negative as they relate to the chief complaint within the history of present illness.  Patient denies fevers or chills.  Assessment & Plan: Visit Diagnoses:  1. Primary osteoarthritis, left shoulder     Plan: Patient is a 65 year old male who presents for evaluation of left shoulder pain following [place] injury about 4 to 5 months ago.  He has had MRI scan ordered by Dr. Lorin Mercy demonstrating moderate to severe osteoarthritis of the glenohumeral joint along with reverse Hill-Sachs lesion and posterior labral tear.  He has no symptomatic instability by his history.  His main complaint is pain which is waking him up at night and causing difficulty with his daily  activities.  Discussed options available to patient.  He has failed oral steroid courses and oral medications.  Other options available would be trial of intra-articular cortisone injection versus physical therapy versus anatomic total shoulder arthroplasty.  Doubt that physical therapy would provide much benefit given his severe arthritis aside from preserving his range of motion.  Discussed the risks and benefits of shoulder replacement as well as the recovery time frame and what it entails.  After lengthy discussion, patient would like to hold off on any decision for now.  He will discuss these options with his physician at the [facility] and see what they think.  Follow-up with the office as needed.  He did meet Dr. Marlou Sa today.  Follow-Up Instructions: No follow-ups on file.   Orders:  No orders of the defined types were placed in this encounter.  No orders of the defined types were placed in this encounter.     Procedures: No procedures performed   Clinical Data: No additional findings.  Objective: Vital Signs: There were no vitals taken for this visit.  Physical Exam:  Constitutional: Patient appears well-developed HEENT:  Head: Normocephalic Eyes:EOM are normal Neck: Normal range of motion Cardiovascular: Normal rate Pulmonary/chest: Effort normal Neurologic: Patient is alert Skin: Skin is warm Psychiatric: Patient has normal mood and affect  Ortho Exam: Ortho exam demonstrates left shoulder with 25 degrees X rotation, 75 degrees abduction, 150 degrees forward flexion.  Axillary nerve intact with deltoid firing.  Pain with passive motion of the shoulder.  Excellent rotator cuff strength of supraspinatus, infraspinatus, subscapularis rated 5/5.  Negative external rotation lag sign.  Negative belly press test.  Negative drop arm test.  No tenderness throughout the axial cervical spine.  Negative Lhermitte sign.  Negative Spurling sign.  +5 motor strength of bilateral grip strength,  finger abduction, pronation/supination, bicep, tricep, deltoid.  Specialty Comments:  No specialty comments available.  Imaging: No results found.   PMFS History: Patient Active Problem List   Diagnosis Date Noted   Primary osteoarthritis, left shoulder 03/22/2021   Diabetic gastroparesis associated with type 2 diabetes mellitus ([place]) 01/17/2021   Severe obesity (BMI 35.0-39.9) with comorbidity ([place]) 01/13/2021   Insulin-requiring or dependent type II diabetes mellitus ([place]) 01/13/2021   High cholesterol 01/13/2021   Hypertension 01/13/2021   History of stomach ulcers 01/13/2021   CKD (chronic kidney disease) stage 2, GFR 60-89 ml/min 01/13/2021   GERD (gastroesophageal reflux disease) 01/13/2021   Colon mass s/p robotic proximal colectomy 01/11/2021 01/11/2021   Shoulder subluxation, left 12/16/2020   Transaminitis    Hyperglycemia    Non-traumatic rhabdomyolysis    OSA on CPAP    Obesity hypoventilation syndrome ([place])    Atypical chest pain 08/26/2013   Past Medical History:  Diagnosis Date   Acute encephalopathy    Acute renal failure ([place])    COVID-19    11-29-20   Diabetes mellitus without complication (HCC)    Diabetic ketoacidosis with coma associated with type 2 diabetes mellitus ([place])    DKA (diabetic ketoacidoses) 08/05/2015   High cholesterol    History of stomach ulcers    Hypertension     Family History  Problem Relation Age of Onset   Diabetes Father    Hypertension Father    Colon cancer Neg Hx    Stomach cancer Neg Hx    Rectal cancer Neg Hx    Esophageal cancer Neg Hx     Past Surgical History:  Procedure Laterality Date   APPENDECTOMY     polyp removal     TONSILLECTOMY     Social History   Occupational History   Not on file  Tobacco Use   Smoking status: Former    Types: Cigarettes   Smokeless tobacco: Never  Vaping Use   Vaping Use: Never used  Substance and Sexual Activity   Alcohol use: No   Drug use: No   Sexual activity: Not  on file

## 2021-04-09 ENCOUNTER — Encounter: Payer: Self-pay | Admitting: Orthopedic Surgery

## 2021-06-02 ENCOUNTER — Telehealth: Payer: Self-pay | Admitting: Orthopedic Surgery

## 2021-06-02 DIAGNOSIS — S43002A Unspecified subluxation of left shoulder joint, initial encounter: Secondary | ICD-10-CM

## 2021-06-02 DIAGNOSIS — M19012 Primary osteoarthritis, left shoulder: Secondary | ICD-10-CM

## 2021-06-02 NOTE — Telephone Encounter (Signed)
CT ordered. 

## 2021-06-02 NOTE — Telephone Encounter (Signed)
Patient seen by Dr. Marlou Sa on 04-05-21 and would like to schedule left shoulder replacement.  He states he has received authorization through the [place].  Scan done at [facility] 02-24-21 prior to being seen by Dr. Marlou Sa. May have been ordered by Dr. Rodell Perna.  If surgery is in order please provide surgery sheet.

## 2021-06-02 NOTE — Telephone Encounter (Signed)
Pls order thin cut ct scan leftr shoulder for preop planning purposes thx

## 2021-06-05 ENCOUNTER — Emergency Department (HOSPITAL_COMMUNITY)
Admission: EM | Admit: 2021-06-05 | Discharge: 2021-06-05 | Disposition: A | Discharge status: Home / Self Care | Payer: No Typology Code available for payment source | Attending: Emergency Medicine | Admitting: Emergency Medicine

## 2021-06-05 ENCOUNTER — Encounter (HOSPITAL_COMMUNITY): Payer: Self-pay

## 2021-06-05 ENCOUNTER — Other Ambulatory Visit: Payer: Self-pay

## 2021-06-05 ENCOUNTER — Emergency Department (HOSPITAL_COMMUNITY): Payer: No Typology Code available for payment source

## 2021-06-05 DIAGNOSIS — M542 Cervicalgia: Secondary | ICD-10-CM | POA: Insufficient documentation

## 2021-06-05 DIAGNOSIS — Z79899 Other long term (current) drug therapy: Secondary | ICD-10-CM | POA: Insufficient documentation

## 2021-06-05 DIAGNOSIS — Z7982 Long term (current) use of aspirin: Secondary | ICD-10-CM | POA: Insufficient documentation

## 2021-06-05 DIAGNOSIS — I129 Hypertensive chronic kidney disease with stage 1 through stage 4 chronic kidney disease, or unspecified chronic kidney disease: Secondary | ICD-10-CM | POA: Insufficient documentation

## 2021-06-05 DIAGNOSIS — G8929 Other chronic pain: Secondary | ICD-10-CM | POA: Diagnosis not present

## 2021-06-05 DIAGNOSIS — Z87891 Personal history of nicotine dependence: Secondary | ICD-10-CM | POA: Diagnosis not present

## 2021-06-05 DIAGNOSIS — M19012 Primary osteoarthritis, left shoulder: Secondary | ICD-10-CM | POA: Diagnosis not present

## 2021-06-05 DIAGNOSIS — Z7984 Long term (current) use of oral hypoglycemic drugs: Secondary | ICD-10-CM | POA: Diagnosis not present

## 2021-06-05 DIAGNOSIS — N182 Chronic kidney disease, stage 2 (mild): Secondary | ICD-10-CM | POA: Insufficient documentation

## 2021-06-05 DIAGNOSIS — Z8616 Personal history of COVID-19: Secondary | ICD-10-CM | POA: Diagnosis not present

## 2021-06-05 DIAGNOSIS — Z794 Long term (current) use of insulin: Secondary | ICD-10-CM | POA: Diagnosis not present

## 2021-06-05 DIAGNOSIS — M549 Dorsalgia, unspecified: Secondary | ICD-10-CM | POA: Insufficient documentation

## 2021-06-05 DIAGNOSIS — M25512 Pain in left shoulder: Secondary | ICD-10-CM | POA: Insufficient documentation

## 2021-06-05 DIAGNOSIS — E1122 Type 2 diabetes mellitus with diabetic chronic kidney disease: Secondary | ICD-10-CM | POA: Insufficient documentation

## 2021-06-05 DIAGNOSIS — I10 Essential (primary) hypertension: Secondary | ICD-10-CM

## 2021-06-05 DIAGNOSIS — R079 Chest pain, unspecified: Secondary | ICD-10-CM | POA: Diagnosis not present

## 2021-06-05 LAB — BASIC METABOLIC PANEL
Anion gap: 6 (ref 5–15)
BUN: 20 mg/dL (ref 8–23)
CO2: 22 mmol/L (ref 22–32)
Calcium: 8.8 mg/dL — ABNORMAL LOW (ref 8.9–10.3)
Chloride: 104 mmol/L (ref 98–111)
Creatinine, Ser: 1.18 mg/dL (ref 0.61–1.24)
GFR, Estimated: >60 mL/min (ref >60)
Glucose, Bld: 167 mg/dL — ABNORMAL HIGH (ref 70–99)
Potassium: 3.5 mmol/L (ref 3.5–5.1)
Sodium: 132 mmol/L — ABNORMAL LOW (ref 135–145)

## 2021-06-05 LAB — CBC WITH DIFFERENTIAL/PLATELET
Abs Immature Granulocytes: 0.04 10*3/uL (ref 0.00–0.07)
Basophils Absolute: 0 10*3/uL (ref 0.0–0.1)
Basophils Relative: 0 %
Eosinophils Absolute: 0.3 10*3/uL (ref 0.0–0.5)
Eosinophils Relative: 4 %
HCT: 43.2 % (ref 39.0–52.0)
Hemoglobin: 14 g/dL (ref 13.0–17.0)
Immature Granulocytes: 1 %
Lymphocytes Relative: 30 %
Lymphs Abs: 2.1 10*3/uL (ref 0.7–4.0)
MCH: 26.8 pg (ref 26.0–34.0)
MCHC: 32.4 g/dL (ref 30.0–36.0)
MCV: 82.6 fL (ref 80.0–100.0)
Monocytes Absolute: 0.4 10*3/uL (ref 0.1–1.0)
Monocytes Relative: 6 %
Neutro Abs: 4.2 10*3/uL (ref 1.7–7.7)
Neutrophils Relative %: 59 %
Platelets: 244 10*3/uL (ref 150–400)
RBC: 5.23 MIL/uL (ref 4.22–5.81)
RDW: 15.5 % (ref 11.5–15.5)
WBC: 7.1 10*3/uL (ref 4.0–10.5)
nRBC: 0 % (ref 0.0–0.2)

## 2021-06-05 LAB — TROPONIN I (HIGH SENSITIVITY)
Troponin I (High Sensitivity): 8 ng/L (ref <18)
Troponin I (High Sensitivity): 8 ng/L (ref <18)

## 2021-06-05 IMAGING — CR DG CHEST 2V
2 series · 2 of 2 positions shown · non-contrast
Comparison: [DATE]

CLINICAL DATA: Chest pain.  Left shoulder pain

EXAM:
CHEST - 2 VIEW

[w chest pa]
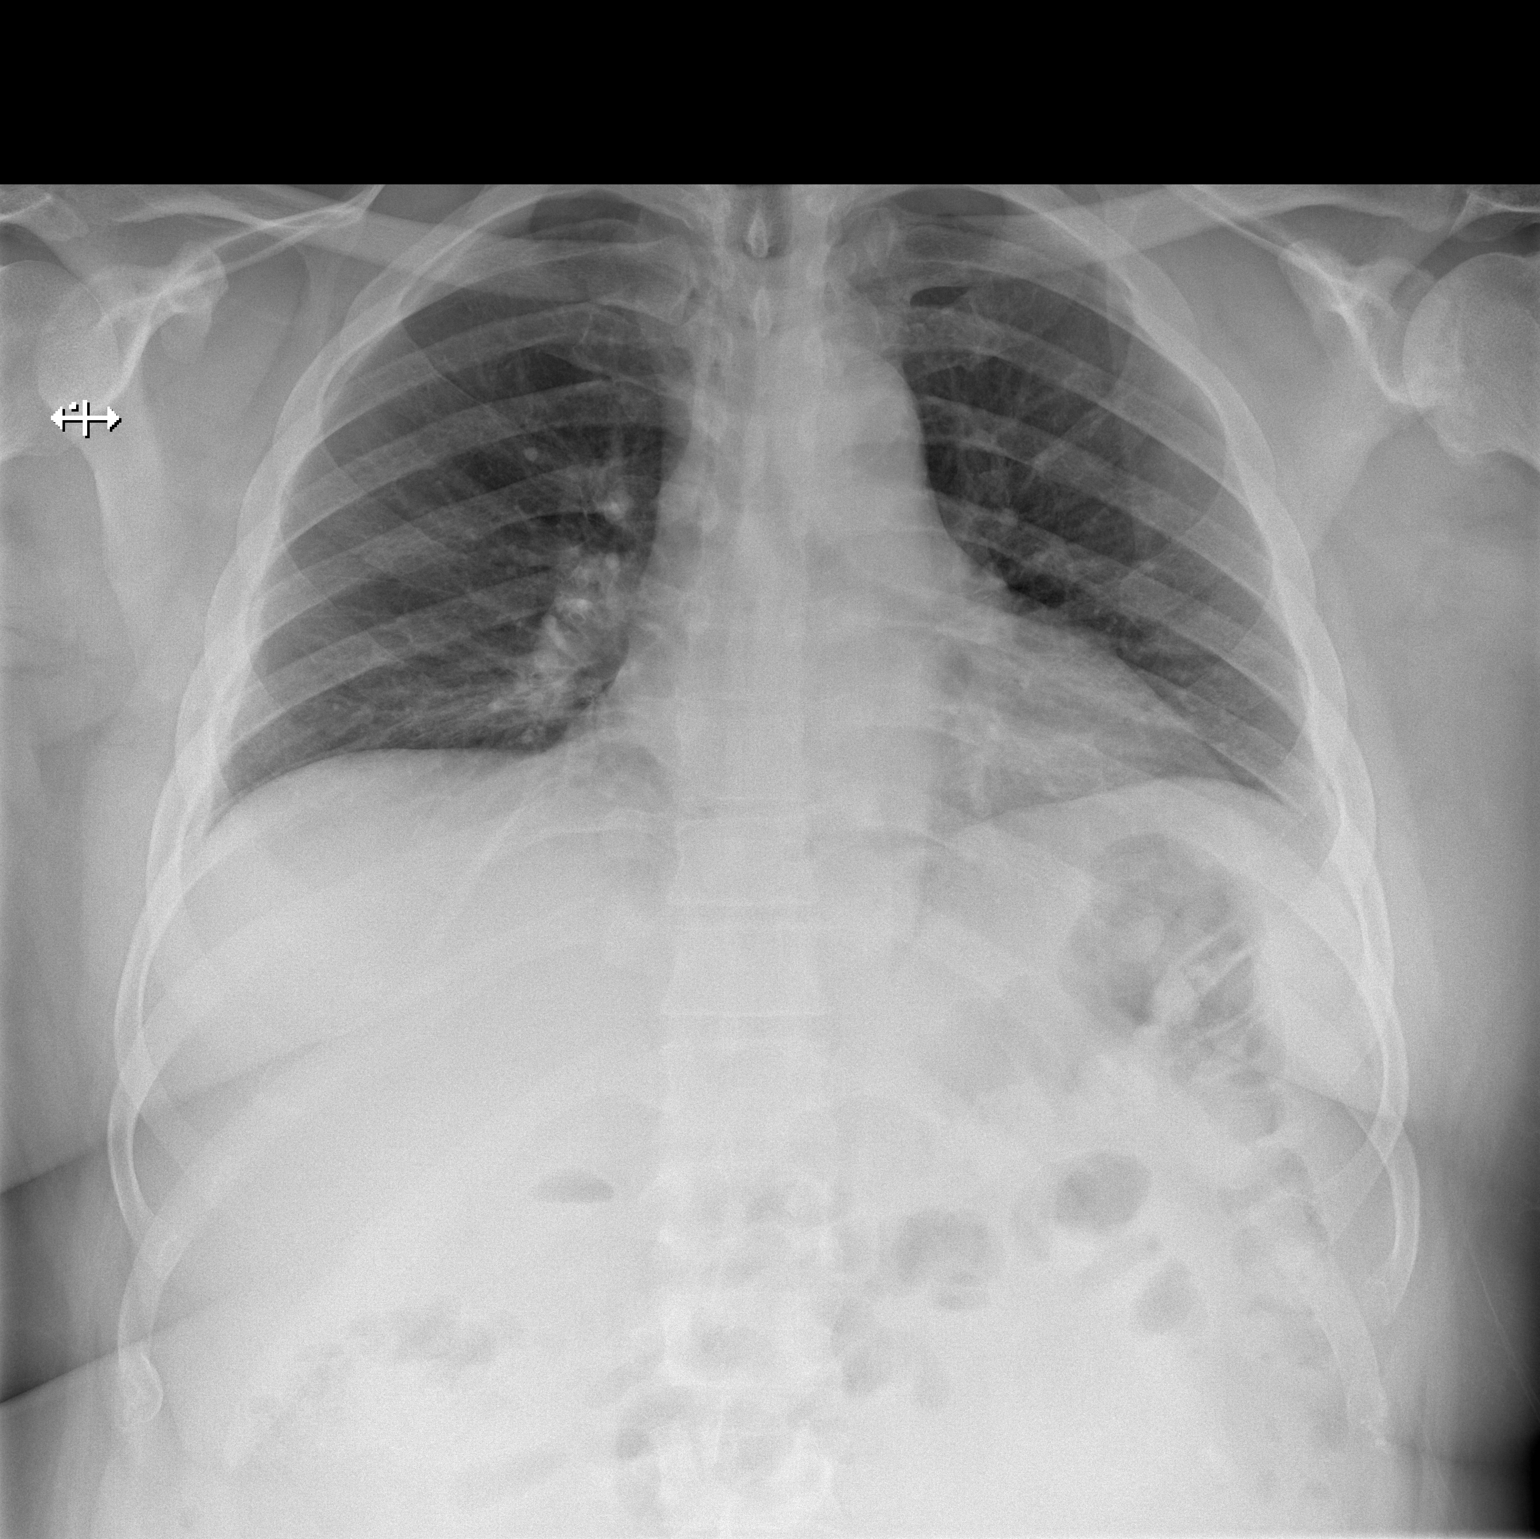

[w chest lat]
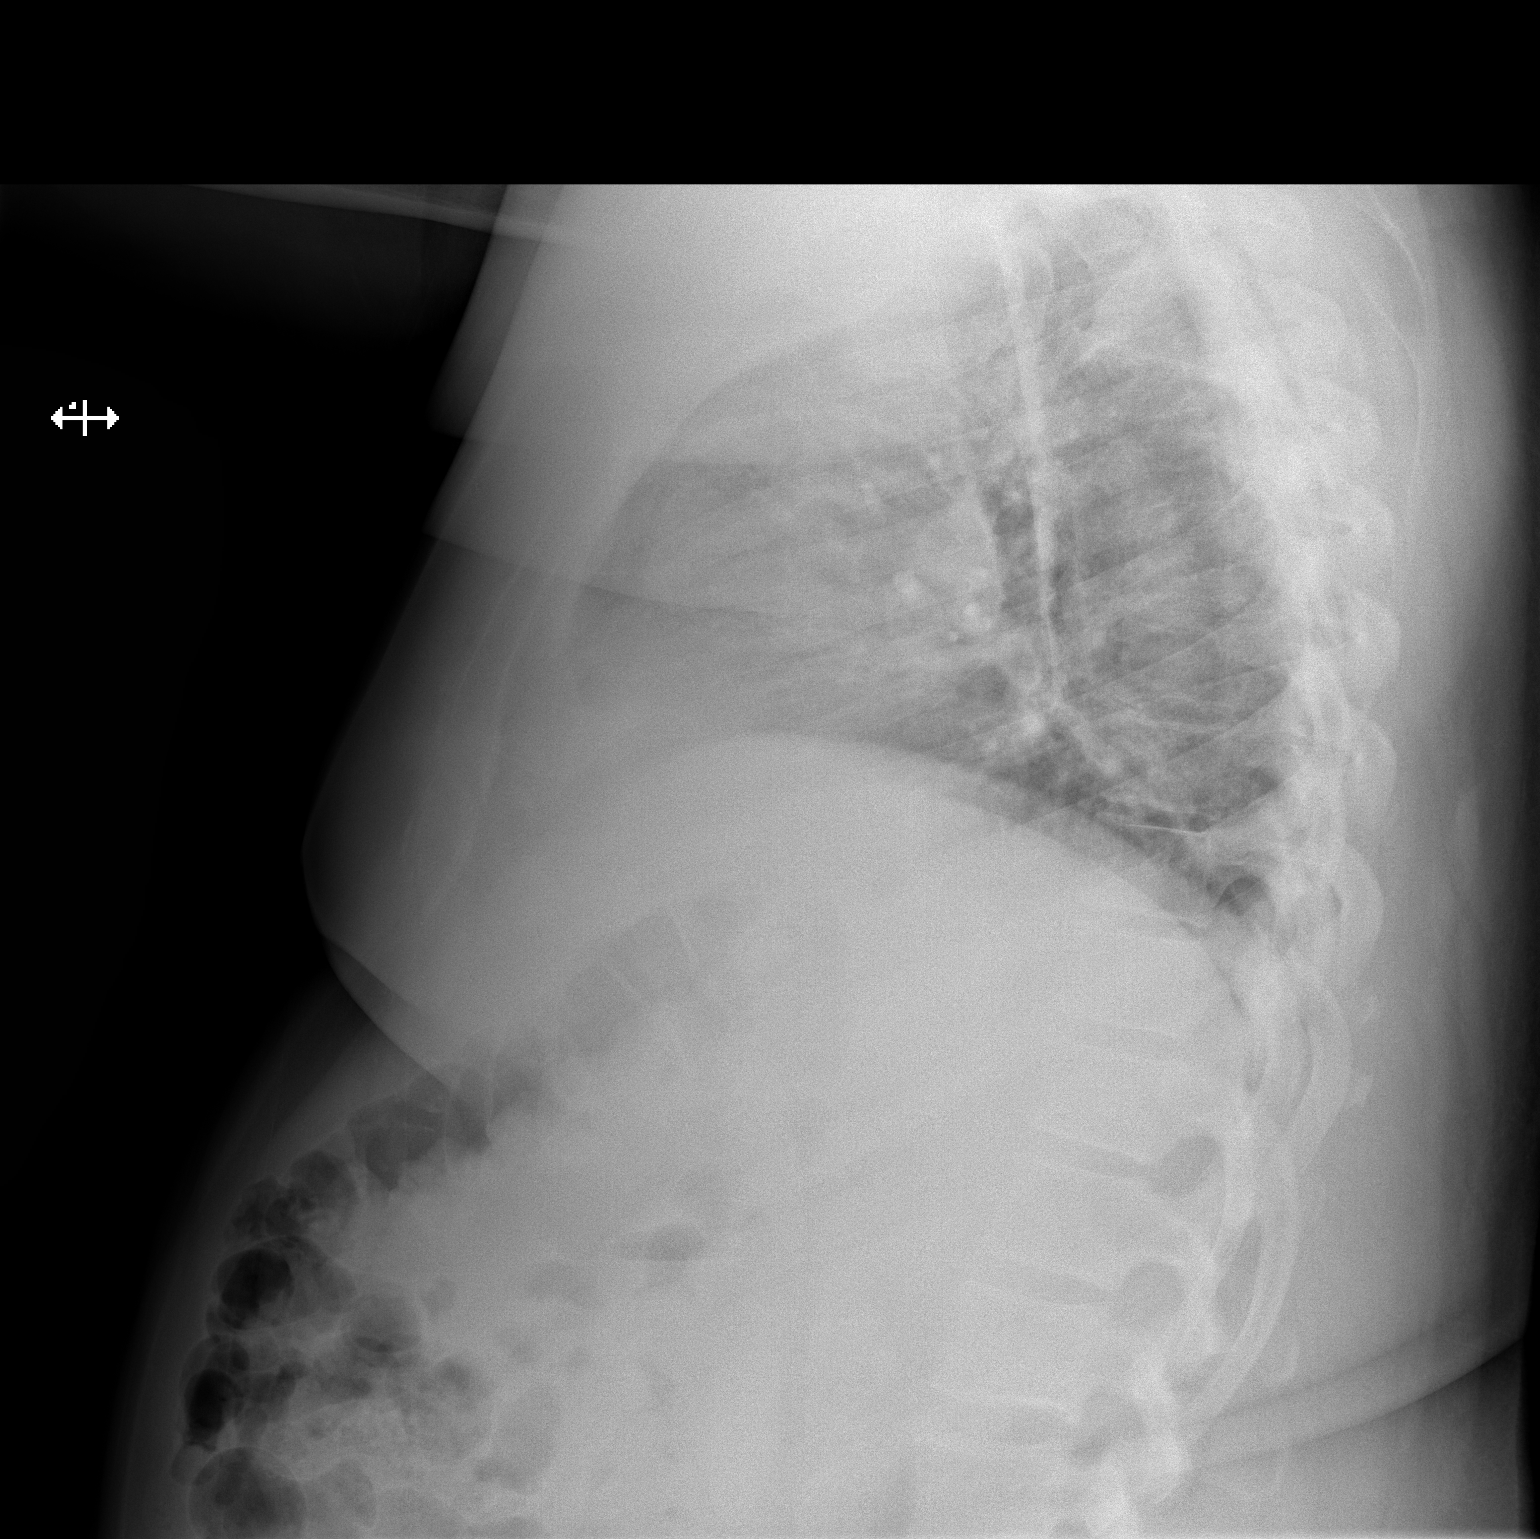

[2 of 2 positions shown; findings below may reference images not displayed]

FINDINGS: The heart size and mediastinal contours are within normal limits.
Both lungs are clear. The visualized skeletal structures are
unremarkable.
IMPRESSION: No active cardiopulmonary disease.

## 2021-06-05 MED ORDER — AMLODIPINE BESYLATE 10 MG PO TABS: 10.0000 mg | ORAL_TABLET | Freq: Every day | ORAL | 0 refills | Status: AC

## 2021-06-05 MED ORDER — OXYCODONE-ACETAMINOPHEN 5-325 MG PO TABS
1.0000 | ORAL_TABLET | Freq: Once | ORAL | Status: AC
  Administered 2021-06-05: 1 via ORAL
  Filled 2021-06-05: qty 1

## 2021-06-05 MED ORDER — MORPHINE SULFATE (PF) 4 MG/ML IV SOLN
4.0000 mg | Freq: Once | INTRAVENOUS | Status: AC
  Administered 2021-06-05: 4 mg via INTRAVENOUS
  Filled 2021-06-05: qty 1

## 2021-06-05 MED ORDER — AMLODIPINE BESYLATE 5 MG PO TABS
10.0000 mg | ORAL_TABLET | Freq: Once | ORAL | Status: AC
  Administered 2021-06-05: 10 mg via ORAL
  Filled 2021-06-05: qty 2

## 2021-06-05 MED ORDER — HYDROCODONE-ACETAMINOPHEN 5-325 MG PO TABS: 1.0000 | ORAL_TABLET | Freq: Four times a day (QID) | ORAL | 0 refills | Status: DC | PRN

## 2021-06-05 NOTE — ED Provider Notes (Signed)
Emergency Medicine Provider Triage Evaluation Note  Micheal Gomez , a 65 y.o. male  was evaluated in triage.  Pt complains of left shoulder pain.  Complains of acute on chronic left shoulder pain.  Today's pain is different from his chronic pain.  Pain is in left shoulder and radiates to back, described as sharp.  Taking gabapentin, ibuprofen, tizanidine without relief.  Review of Systems  Positive: Chest pain/shoulder pain Negative: Fever, sob  Physical Exam  BP (!) 205/98 (BP Location: Right Arm)   Pulse 92   Temp 97.7 F (36.5 C) (Oral)   Resp 18   SpO2 100%  Gen:   Awake, no distress   Resp:  Normal effort  MSK:   2+ radial pulses bilaterally.  TTP over left anterior shoulder.    Medical Decision Making  Medically screening exam initiated at 4:42 AM.  Appropriate orders placed.  Micheal Gomez was informed that the remainder of the evaluation will be completed by another provider, this initial triage assessment does not replace that evaluation, and the importance of remaining in the ED until their evaluation is complete.  Pt with acute on chronic shoulder pain, hypertensive in triage.  He has htn, does not know what his BP med is but takes in the morning.  Will treat pain, check labs, EKG, plain films.      Quintella Reichert, MD 06/05/21 [phone]

## 2021-06-05 NOTE — ED Triage Notes (Signed)
Pt reports with left shoulder pain that radiates into his chest and left back x 3 weeks.

## 2021-06-05 NOTE — Discharge Instructions (Addendum)
Take the medication as needed for your shoulder pain.  Make sure to follow-up with your primary care doctor to check on your blood pressure.  Take the increased dose of Norvasc because your blood pressure was elevated today.  Return as needed for worsening symptoms

## 2021-06-05 NOTE — ED Provider Notes (Signed)
[facility] DEPT Provider Note   CSN: [id number] Arrival date & time: 06/05/21  0423     History Chief Complaint  Patient presents with   Shoulder Pain   Back Pain    Micheal Gomez is a 65 y.o. male.   Shoulder Pain Associated symptoms: back pain   Back Pain  Patient presented to the emergency room for evaluation of chest back and shoulder pain.  Patient states this all started about 30 months ago.  He had a fall and injury causing pain to his left shoulder.  Patient states he has been followed at the [place].  He has been told he has arthritis in his shoulder.  He is waiting for surgery.  Patient states in the last few weeks the symptoms have worsened.  Pain is in his shoulder but goes up into his neck and into his back and the top of his chest.  He is not having any trouble with any shortness of breath.  The pain does increase when he tries to move his shoulder or palpates the muscles.  He denies any numbness or weakness.  No fevers or chills.  No leg swelling.  [facility]  Patient was seen in the [place] clinic on December 2.  He was given a prescription for Motrin and tizanidine  Past Medical History:  Diagnosis Date   Acute encephalopathy    Acute renal failure ([place])    COVID-19    11-29-20   Diabetes mellitus without complication ([place])    Diabetic ketoacidosis with coma associated with type 2 diabetes mellitus ([place])    DKA (diabetic ketoacidoses) 08/05/2015   High cholesterol    History of stomach ulcers    Hypertension     Patient Active Problem List   Diagnosis Date Noted   Primary osteoarthritis, left shoulder 03/22/2021   Diabetic gastroparesis associated with type 2 diabetes mellitus ([place]) 01/17/2021   Severe obesity (BMI 35.0-39.9) with comorbidity ([place]) 01/13/2021   Insulin-requiring or dependent type II diabetes mellitus ([place]) 01/13/2021   High cholesterol 01/13/2021   Hypertension 01/13/2021   History of stomach  ulcers 01/13/2021   CKD (chronic kidney disease) stage 2, GFR 60-89 ml/min 01/13/2021   GERD (gastroesophageal reflux disease) 01/13/2021   Colon mass s/p robotic proximal colectomy 01/11/2021 01/11/2021   Shoulder subluxation, left 12/16/2020   Transaminitis    Hyperglycemia    Non-traumatic rhabdomyolysis    OSA on CPAP    Obesity hypoventilation syndrome ([place])    Atypical chest pain 08/26/2013    Past Surgical History:  Procedure Laterality Date   APPENDECTOMY     polyp removal     TONSILLECTOMY         Family History  Problem Relation Age of Onset   Diabetes Father    Hypertension Father    Colon cancer Neg Hx    Stomach cancer Neg Hx    Rectal cancer Neg Hx    Esophageal cancer Neg Hx     Social History   Tobacco Use   Smoking status: Former    Types: Cigarettes   Smokeless tobacco: Never  Vaping Use   Vaping Use: Never used  Substance Use Topics   Alcohol use: No   Drug use: No    Home Medications Prior to Admission medications   Medication Sig Start Date End Date Taking? Authorizing Provider  HYDROcodone-acetaminophen (NORCO/VICODIN) 5-325 MG tablet Take 1 tablet by mouth every 6 (six) hours as needed. 06/05/21  Yes Tomi Bamberger,  Cletis Athens, MD  amLODipine (NORVASC) 10 MG tablet Take 1 tablet (10 mg total) by mouth daily. 06/05/21 07/05/21  Linwood Dibbles, MD  aspirin EC 81 MG tablet Take 81 mg by mouth daily. Swallow whole.    [provider]  atorvastatin (LIPITOR) 20 MG tablet Take 20 mg by mouth daily.    [provider]  bismuth subsalicylate (PEPTO BISMOL) 262 MG/15ML suspension Take 30 mLs by mouth every 6 (six) hours as needed for diarrhea or loose stools or indigestion.    [provider]  blood glucose meter kit and supplies Dispense based on patient and insurance preference. Use up to four times daily as directed. (FOR ICD-9 250.00, 250.01). 08/21/15   Penny Pia, MD  doxepin (SINEQUAN) 50 MG capsule Take 50 mg by mouth at bedtime.     [provider]  glucose blood (FREESTYLE LITE) test strip Use as instructed 08/15/15   Dhungel, Nishant, MD  hydrochlorothiazide (HYDRODIURIL) 25 MG tablet Take 25 mg by mouth daily.    [provider]  insulin aspart (NOVOLOG) 100 UNIT/ML injection Inject 30 Units into the skin 2 (two) times daily before a meal.    [provider]  insulin detemir (LEVEMIR) 100 UNIT/ML injection Inject 0.16 mLs (16 Units total) into the skin daily. Patient taking differently: Inject 30 Units into the skin 2 (two) times daily. 08/21/15   Penny Pia, MD  insulin starter kit- pen needles MISC 1 kit by Other route once. 08/21/15   Penny Pia, MD  INSULIN SYRINGE .5CC/28G 28G X 1/2" 0.5 ML MISC 1 application by Does not apply route daily. 08/15/15   Dhungel, Theda Belfast, MD  metFORMIN (GLUCOPHAGE-XR) 500 MG 24 hr tablet Take 1,000 mg by mouth 2 (two) times daily.    [provider]  metoCLOPramide (REGLAN) 5 MG tablet Take 1 tablet (5 mg total) by mouth every 8 (eight) hours as needed for nausea or vomiting. 01/17/21   Karie Soda, MD  omeprazole (PRILOSEC) 20 MG capsule Take 20 mg by mouth 2 (two) times daily.    [provider]  OVER THE COUNTER MEDICATION Take 2 tablets by mouth daily.    [provider]  Semaglutide, 1 MG/DOSE, (OZEMPIC, 1 MG/DOSE,) 2 MG/1.5ML SOPN Inject 1 mg into the skin every Saturday.    [provider]  traMADol (ULTRAM) 50 MG tablet Take 1-2 tablets (50-100 mg total) by mouth every 6 (six) hours as needed for moderate pain or severe pain. 01/11/21   Karie Soda, MD    Allergies    Nsaids  Review of Systems   Review of Systems  Musculoskeletal:  Positive for back pain.  All other systems reviewed and are negative.  Physical Exam Updated Vital Signs BP (!) 194/102   Pulse 67   Temp 97.7 F (36.5 C)   Resp 13   Ht 1.753 m (5\' 9" )   Wt 118.8 kg   SpO2 100%   BMI 38.69 kg/m   Physical Exam Vitals and nursing note  reviewed.  Constitutional:      General: He is not in acute distress.    Appearance: He is well-developed.  HENT:     Head: Normocephalic and atraumatic.     Right Ear: External ear normal.     Left Ear: External ear normal.  Eyes:     General: No scleral icterus.       Right eye: No discharge.        Left eye: No discharge.  Conjunctiva/sclera: Conjunctivae normal.  Neck:     Trachea: No tracheal deviation.  Cardiovascular:     Rate and Rhythm: Normal rate and regular rhythm.  Pulmonary:     Effort: Pulmonary effort is normal. No respiratory distress.     Breath sounds: Normal breath sounds. No stridor. No wheezing or rales.  Abdominal:     General: Bowel sounds are normal. There is no distension.     Palpations: Abdomen is soft.     Tenderness: There is no abdominal tenderness. There is no guarding or rebound.  Musculoskeletal:        General: Tenderness present. No deformity.     Right shoulder: Tenderness present. No deformity or effusion.     Cervical back: Neck supple.     Comments: Tenderness palpation left shoulder as well as left trapezius, no erythema or edema, pain with range of motion of the left shoulder that reproduces his symptoms  Skin:    General: Skin is warm and dry.     Findings: No rash.  Neurological:     General: No focal deficit present.     Mental Status: He is alert.     Cranial Nerves: No cranial nerve deficit (no facial droop, extraocular movements intact, no slurred speech).     Sensory: No sensory deficit.     Motor: No abnormal muscle tone or seizure activity.     Coordination: Coordination normal.  Psychiatric:        Mood and Affect: Mood normal.    ED Results / Procedures / Treatments   Labs (all labs ordered are listed, but only abnormal results are displayed) Labs Reviewed  BASIC METABOLIC PANEL - Abnormal; Notable for the following components:      Result Value   Sodium 132 (*)    Glucose, Bld 167 (*)    Calcium 8.8 (*)     All other components within normal limits  CBC WITH DIFFERENTIAL/PLATELET  TROPONIN I (HIGH SENSITIVITY)  TROPONIN I (HIGH SENSITIVITY)    EKG EKG Interpretation  Date/Time:  Monday June 05 2021 05:14:25 EST Ventricular Rate:  91 PR Interval:  190 QRS Duration: 98 QT Interval:  352 QTC Calculation: 433 R Axis:   49 Text Interpretation: Sinus rhythm Probable left atrial enlargement Probable anteroseptal infarct, old Confirmed by Quintella Reichert [id number]) on 06/05/2021 5:21:25 AM  Radiology DG Chest 2 View  Result Date: 06/05/2021 CLINICAL DATA:  Chest pain.  Left shoulder pain EXAM: CHEST - 2 VIEW COMPARISON:  10/14/2020 FINDINGS: The heart size and mediastinal contours are within normal limits. Both lungs are clear. The visualized skeletal structures are unremarkable. IMPRESSION: No active cardiopulmonary disease. Electronically Signed   By: Jorje Guild M.D.   On: 06/05/2021 05:13   DG Shoulder Left  Result Date: 06/05/2021 CLINICAL DATA:  Left shoulder pain EXAM: LEFT SHOULDER - 2+ VIEW COMPARISON:  None. FINDINGS: There is no evidence of fracture or dislocation. Glenohumeral osteoarthritis with joint narrowing and spurring. IMPRESSION: Glenohumeral osteoarthritis. No acute finding Electronically Signed   By: Jorje Guild M.D.   On: 06/05/2021 05:15    Procedures Procedures   Medications Ordered in ED Medications  amLODipine (NORVASC) tablet 10 mg (has no administration in time range)  oxyCODONE-acetaminophen (PERCOCET/ROXICET) 5-325 MG per tablet 1 tablet (1 tablet Oral Given 06/05/21 0504)  morphine 4 MG/ML injection 4 mg (4 mg Intravenous Given 06/05/21 0818)    ED Course  I have reviewed the triage vital signs and the nursing notes.  Pertinent  labs & imaging results that were available during my care of the patient were reviewed by me and considered in my medical decision making (see chart for details).  Clinical Course as of 06/05/21 0941  Mon Jun 05, 2021   7353 CBC and metabolic panel are normal.  First troponin is normal [JK]  0806 No acute findings noted on chest x-ray.  Shoulder x-ray shows glenohumeral arthritis [JK]  0919 Serial troponins are normal [JK]    Clinical Course User Index [JK] Dorie Rank, MD   MDM Rules/Calculators/A&P                           Patient presented to ED with complaints of chest shoulder and back pain.  Noted to be hypertensive.  Considered possibility aortic disease accounting for his chest pain.  Considered acute coronary syndrome.  Serial troponins are normal.  Doubt ACS.  Chest x-ray does not show any acute abnormalities.  No mediastinal widening.  Patient does have evidence of arthritis in his shoulder and the symptoms have been ongoing for months.  Suspect his hypertension is incidental and not related to his pain today.  Patient was given a dose of pain medications.  We will also give him a dose of his blood pressure medications and recommend repeat with his primary doctor. Final Clinical Impression(s) / ED Diagnoses Final diagnoses:  Chronic left shoulder pain  Hypertension, unspecified type    Rx / DC Orders ED Discharge Orders          Ordered    HYDROcodone-acetaminophen (NORCO/VICODIN) 5-325 MG tablet  Every 6 hours PRN        06/05/21 0941    amLODipine (NORVASC) 10 MG tablet  Daily        06/05/21 0941             Dorie Rank, MD 06/05/21 [phone]

## 2021-07-03 IMAGING — CR DG SHOULDER 2+V*L*
3 series · 3 of 3 positions shown · non-contrast
Comparison: None.

CLINICAL DATA: Left shoulder pain

EXAM:
LEFT SHOULDER - 2+ VIEW

[w shoulder external left]
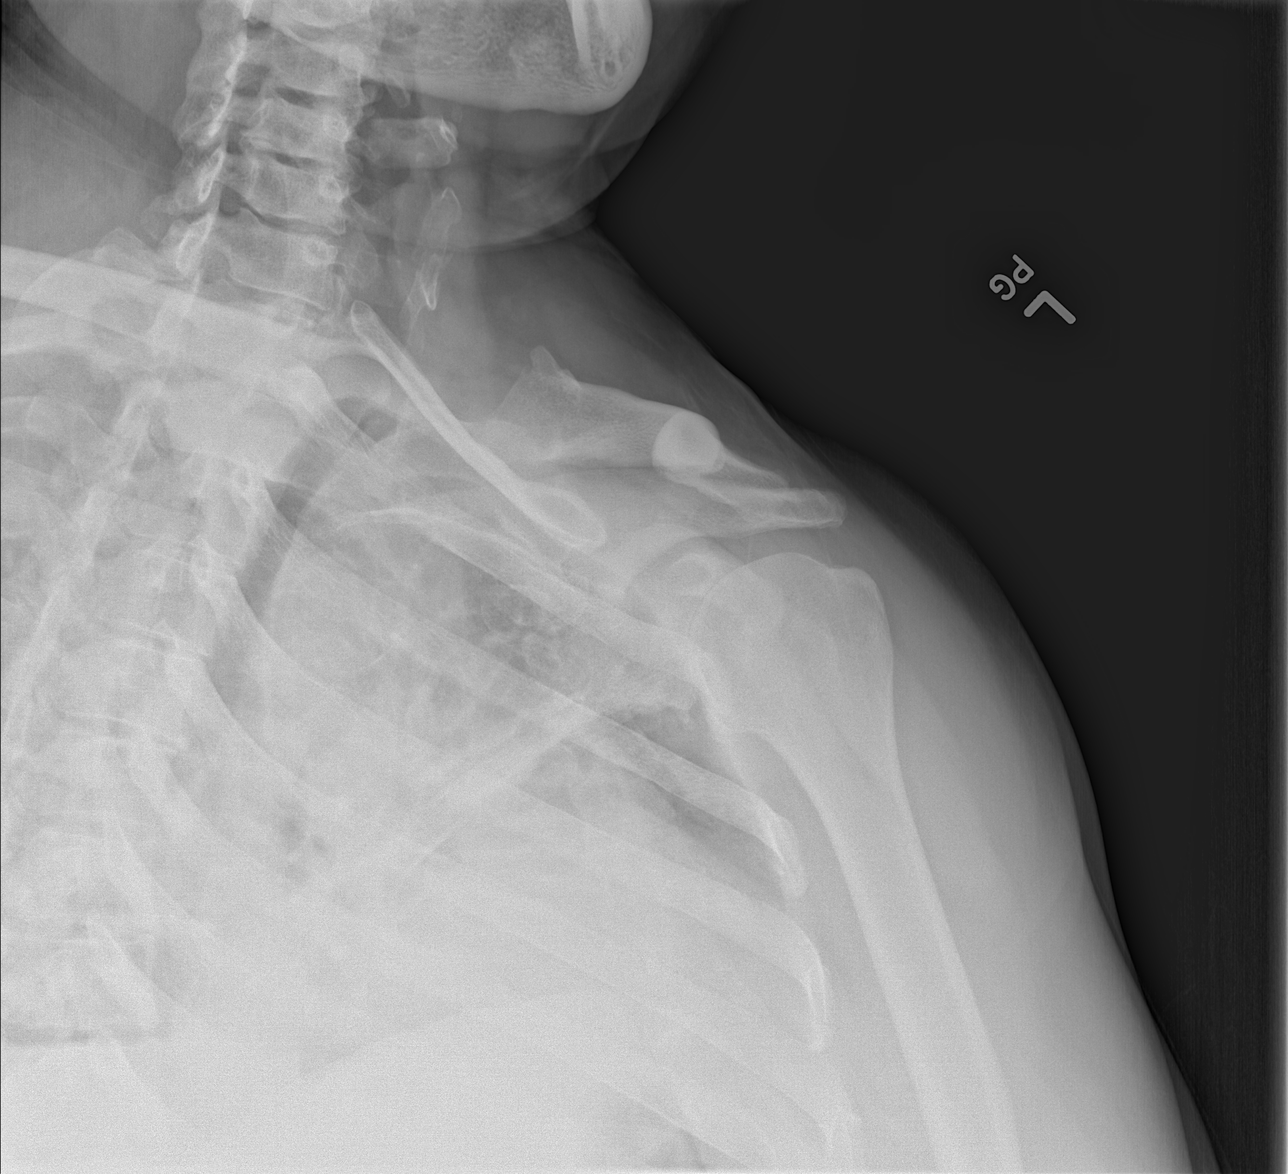

[w shoulder y-view left]
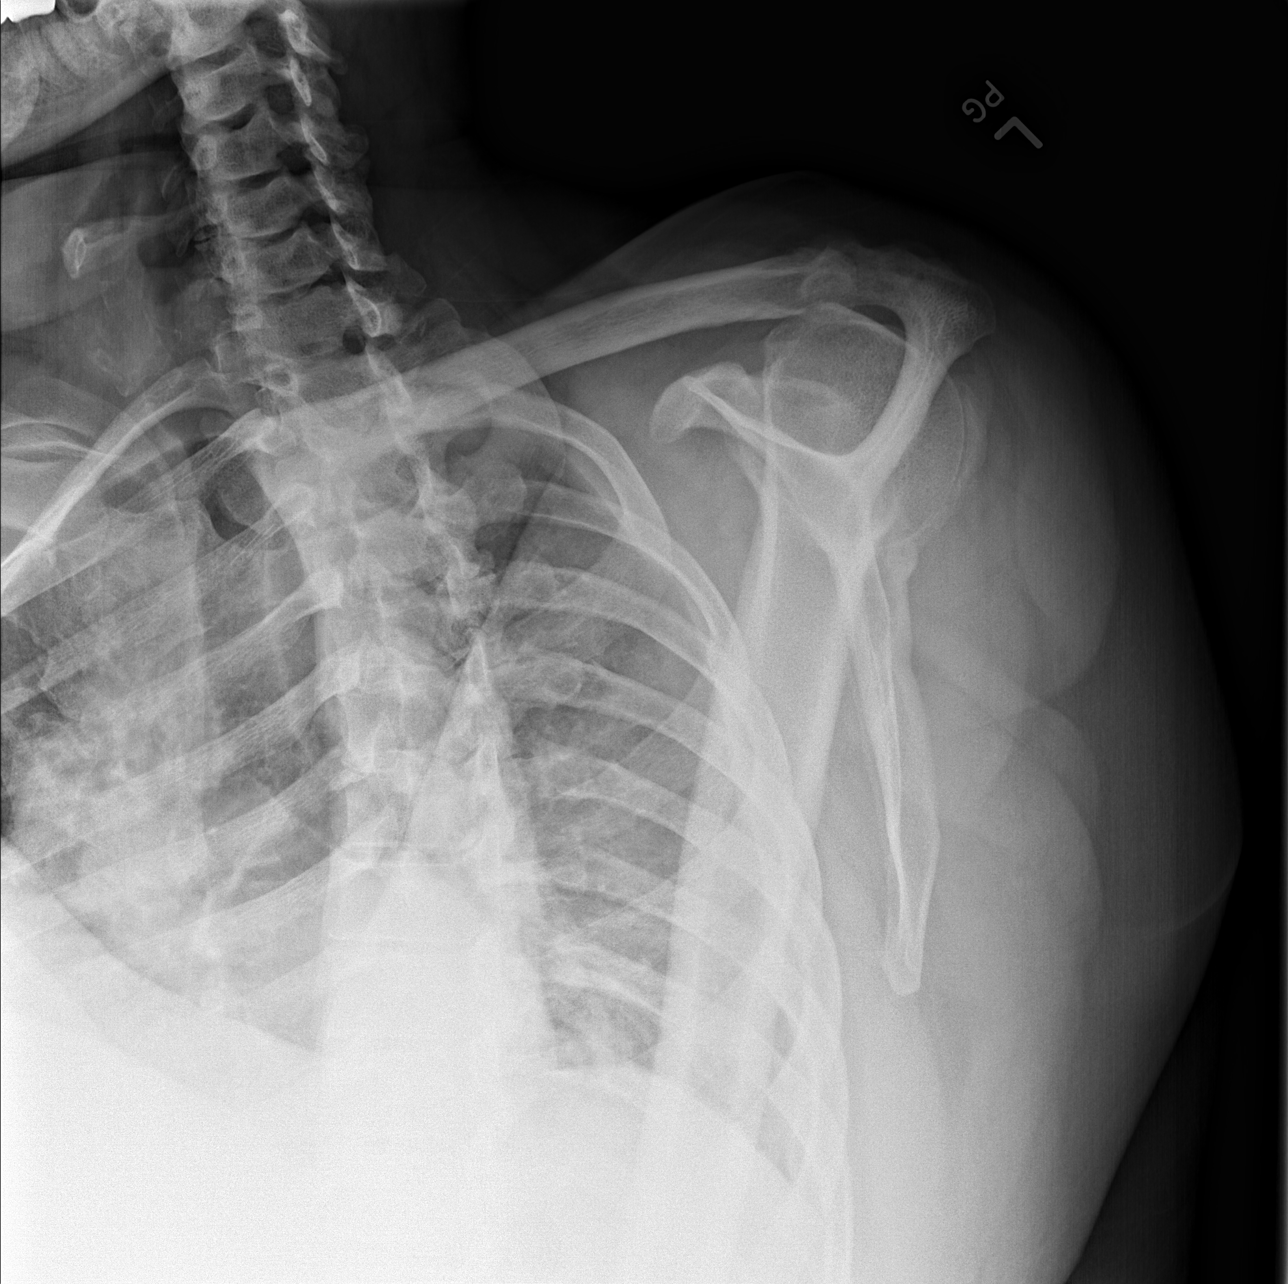

[x shoulder axillary left]
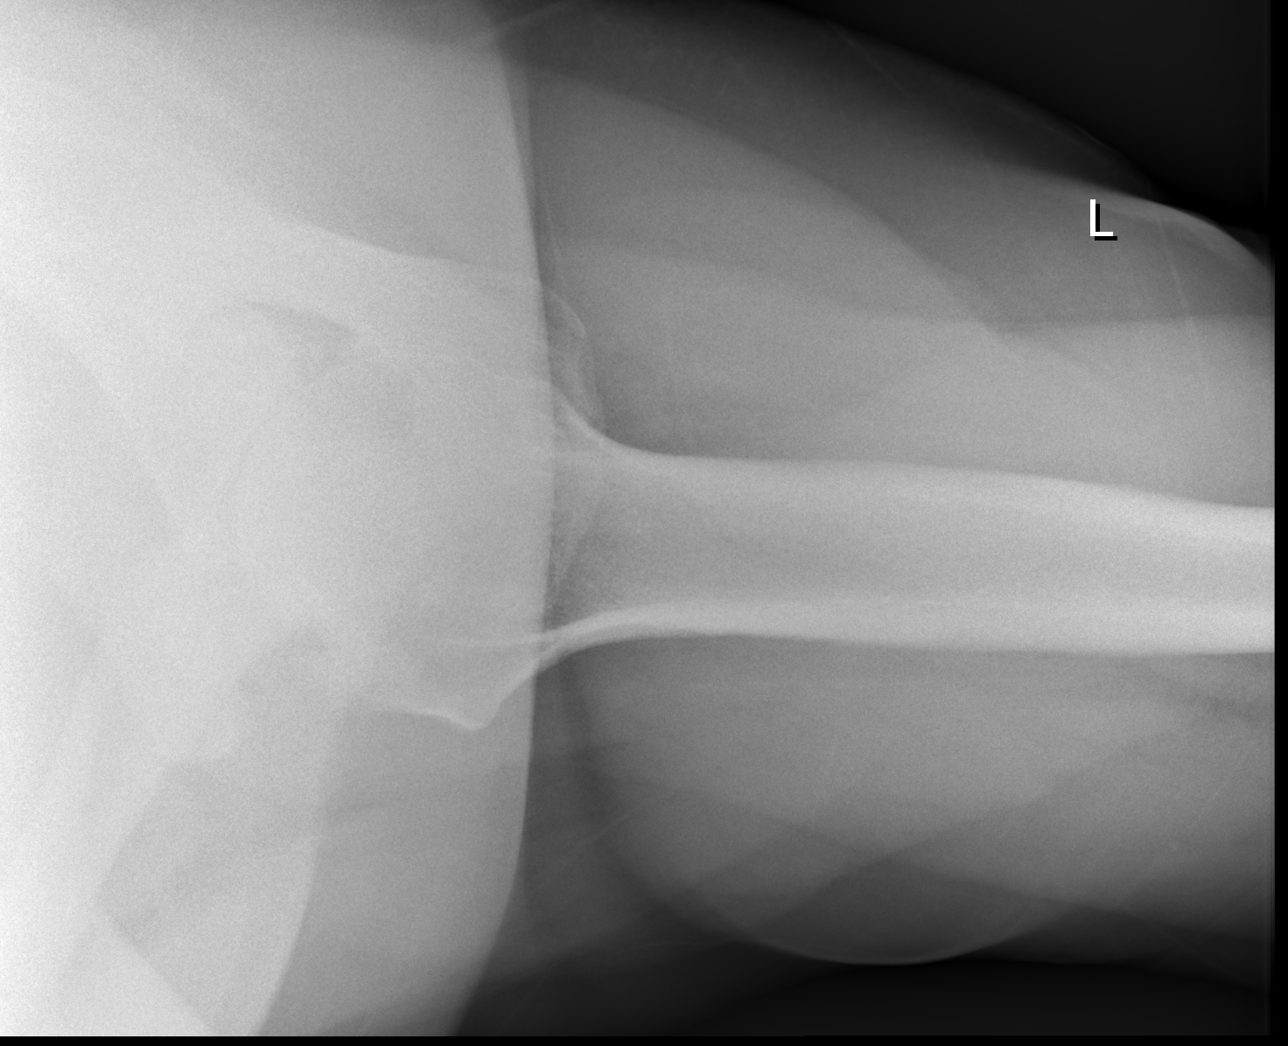

[3 of 3 positions shown; findings below may reference images not displayed]

FINDINGS: There is no evidence of fracture or dislocation. Glenohumeral
osteoarthritis with joint narrowing and spurring.
IMPRESSION: Glenohumeral osteoarthritis.

No acute finding

## 2021-07-11 ENCOUNTER — Other Ambulatory Visit: Payer: Self-pay

## 2021-07-11 ENCOUNTER — Encounter (HOSPITAL_COMMUNITY): Payer: Self-pay

## 2021-07-11 ENCOUNTER — Emergency Department (HOSPITAL_COMMUNITY): Payer: No Typology Code available for payment source

## 2021-07-11 ENCOUNTER — Emergency Department (HOSPITAL_COMMUNITY)
Admission: EM | Admit: 2021-07-11 | Discharge: 2021-07-11 | Disposition: A | Discharge status: Home / Self Care | Payer: No Typology Code available for payment source | Attending: Emergency Medicine | Admitting: Emergency Medicine

## 2021-07-11 DIAGNOSIS — I10 Essential (primary) hypertension: Secondary | ICD-10-CM | POA: Diagnosis not present

## 2021-07-11 DIAGNOSIS — R4701 Aphasia: Secondary | ICD-10-CM | POA: Diagnosis not present

## 2021-07-11 DIAGNOSIS — R471 Dysarthria and anarthria: Secondary | ICD-10-CM

## 2021-07-11 DIAGNOSIS — Z7982 Long term (current) use of aspirin: Secondary | ICD-10-CM | POA: Insufficient documentation

## 2021-07-11 DIAGNOSIS — Z79899 Other long term (current) drug therapy: Secondary | ICD-10-CM | POA: Insufficient documentation

## 2021-07-11 DIAGNOSIS — R531 Weakness: Secondary | ICD-10-CM | POA: Diagnosis present

## 2021-07-11 LAB — CBC WITH DIFFERENTIAL/PLATELET
Abs Immature Granulocytes: 0.02 10*3/uL (ref 0.00–0.07)
Basophils Absolute: 0 10*3/uL (ref 0.0–0.1)
Basophils Relative: 1 %
Eosinophils Absolute: 0.1 10*3/uL (ref 0.0–0.5)
Eosinophils Relative: 1 %
HCT: 43.5 % (ref 39.0–52.0)
Hemoglobin: 13.9 g/dL (ref 13.0–17.0)
Immature Granulocytes: 0 %
Lymphocytes Relative: 32 %
Lymphs Abs: 2.4 10*3/uL (ref 0.7–4.0)
MCH: 26.6 pg (ref 26.0–34.0)
MCHC: 32 g/dL (ref 30.0–36.0)
MCV: 83.3 fL (ref 80.0–100.0)
Monocytes Absolute: 0.4 10*3/uL (ref 0.1–1.0)
Monocytes Relative: 5 %
Neutro Abs: 4.8 10*3/uL (ref 1.7–7.7)
Neutrophils Relative %: 61 %
Platelets: 243 10*3/uL (ref 150–400)
RBC: 5.22 MIL/uL (ref 4.22–5.81)
RDW: 14.8 % (ref 11.5–15.5)
WBC: 7.7 10*3/uL (ref 4.0–10.5)
nRBC: 0 % (ref 0.0–0.2)

## 2021-07-11 LAB — COMPREHENSIVE METABOLIC PANEL
ALT: 17 U/L (ref 0–44)
AST: 16 U/L (ref 15–41)
Albumin: 3.9 g/dL (ref 3.5–5.0)
Alkaline Phosphatase: 113 U/L (ref 38–126)
Anion gap: 13 (ref 5–15)
BUN: 24 mg/dL — ABNORMAL HIGH (ref 8–23)
CO2: 22 mmol/L (ref 22–32)
Calcium: 9.1 mg/dL (ref 8.9–10.3)
Chloride: 103 mmol/L (ref 98–111)
Creatinine, Ser: 1.45 mg/dL — ABNORMAL HIGH (ref 0.61–1.24)
GFR, Estimated: 53 mL/min — ABNORMAL LOW (ref >60)
Glucose, Bld: 109 mg/dL — ABNORMAL HIGH (ref 70–99)
Potassium: 3.6 mmol/L (ref 3.5–5.1)
Sodium: 138 mmol/L (ref 135–145)
Total Bilirubin: 0.6 mg/dL (ref 0.3–1.2)
Total Protein: 8.3 g/dL — ABNORMAL HIGH (ref 6.5–8.1)

## 2021-07-11 IMAGING — MR MR HEAD W/O CM
10 series · 48 of 48 positions shown · non-contrast
Comparison: None.

CLINICAL DATA: Neuro deficit, acute, stroke suspected

EXAM:
MRI HEAD WITHOUT CONTRAST
TECHNIQUE: Multiplanar, multiecho pulse sequences of the brain and surrounding
structures were obtained without intravenous contrast.

[Series 5: DWI · axial · 3.0mm · 1.36mm/px · z∈[-36,+123]mm · 10 of 108 slices shown (1 of 2)]
[im 1/108]
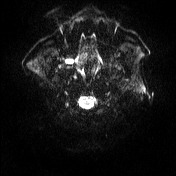
[im 12/108]
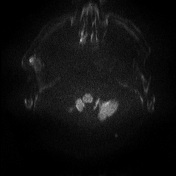
[im 24/108]
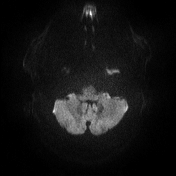
[im 36/108]
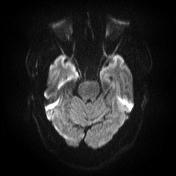
[im 48/108]
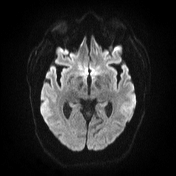
[im 60/108]
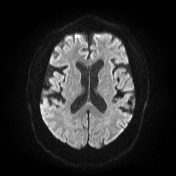
[im 72/108]
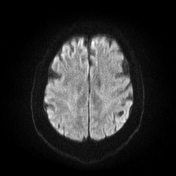
[im 84/108]
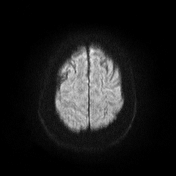
[im 96/108]
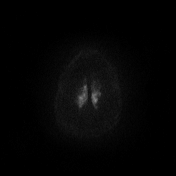
[im 108/108]
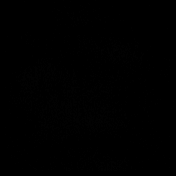

[Series 6: DWI · axial · 3.0mm · 1.36mm/px · z∈[-36,+117]mm · 4 of 52 slices shown (2 of 2)]
[im 1/52]
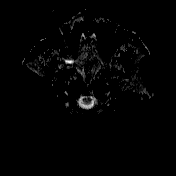
[im 18/52]
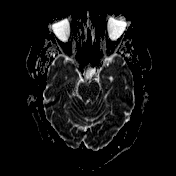
[im 35/52]
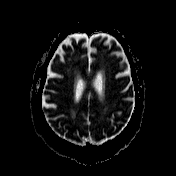
[im 52/52]
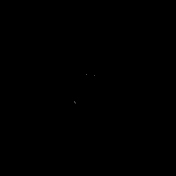

[Series 7: T1 · sagittal · 5.0mm · 0.75mm/px · 2 of 24 slices shown (1 of 2)]
[im 1/24]
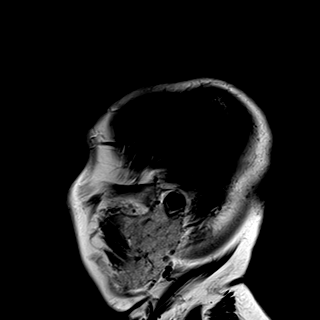
[im 24/24]
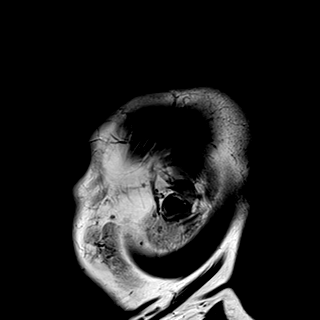

[Series 8: T2 · axial · 5.0mm · 0.62mm/px · z∈[-47,+122]mm · 2 of 27 slices shown (1 of 2)]
[im 1/27]
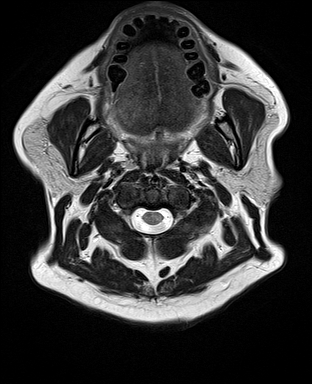
[im 27/27]
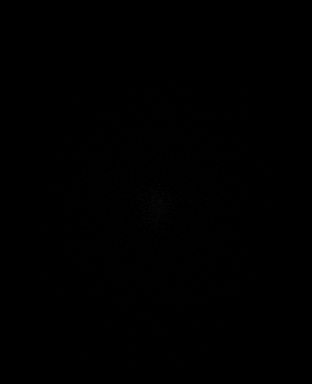

[Series 9: swi_images · axial · 3.0mm · 0.75mm/px · z∈[-45,+120]mm · 4 of 56 slices shown]
[im 1/56]
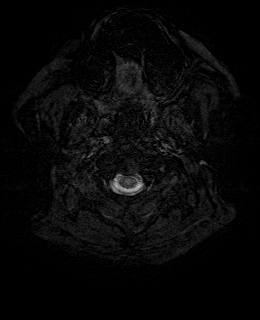
[im 19/56]
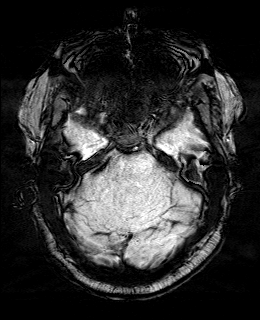
[im 37/56]
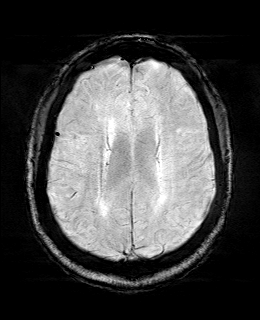
[im 56/56]
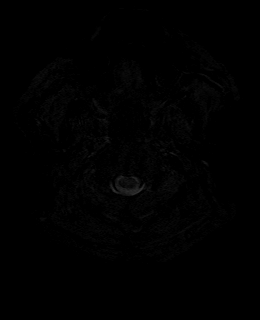

[Series 11: FLAIR · axial · 3.0mm · 0.75mm/px · z∈[-42,+117]mm · 4 of 54 slices shown]
[im 1/54]
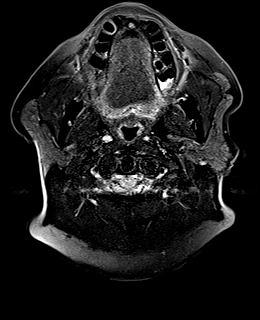
[im 18/54]
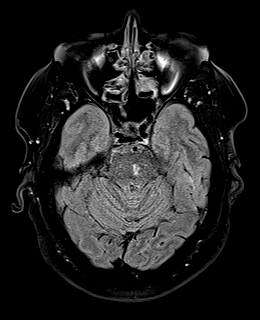
[im 36/54]
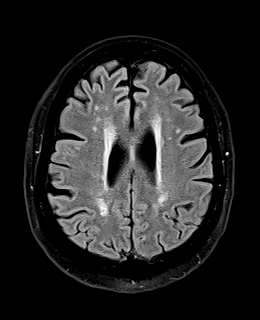
[im 54/54]
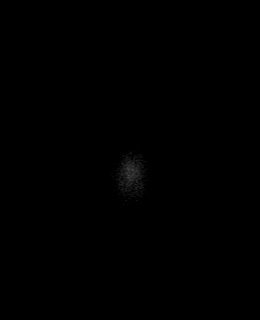

[Series 12: T1 · axial · 1.0mm · 0.94mm/px · z∈[-39,+120]mm · 13 of 160 slices shown (2 of 2)]
[im 1/160]
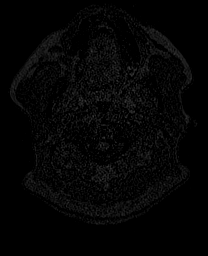
[im 14/160]
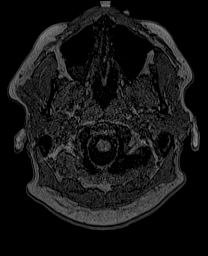
[im 27/160]
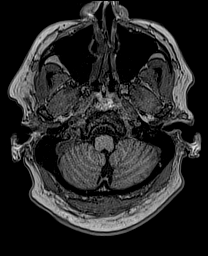
[im 40/160]
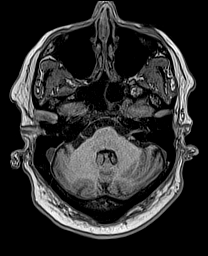
[im 54/160]
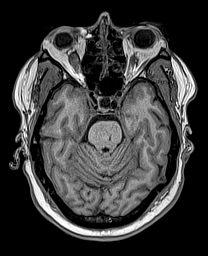
[im 67/160]
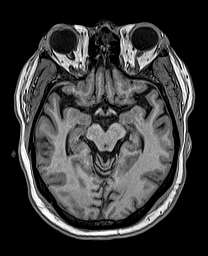
[im 80/160]
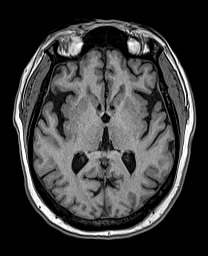
[im 93/160]
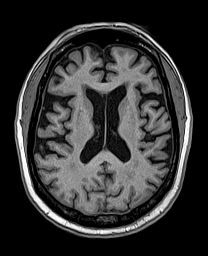
[im 107/160]
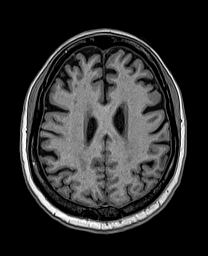
[im 120/160]
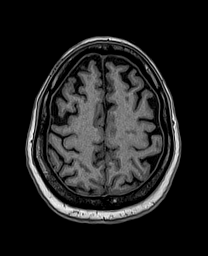
[im 133/160]
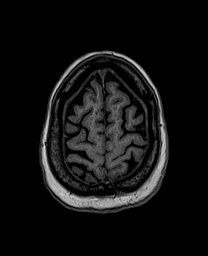
[im 146/160]
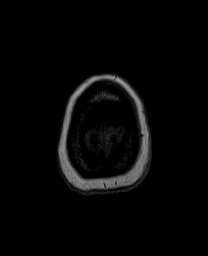
[im 160/160]
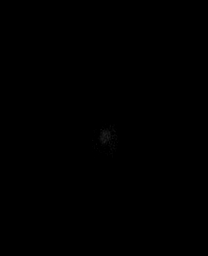

[Series 13: cor dwi_tracew · coronal · 5.0mm · 1.53mm/px · 4 of 56 slices shown]
[im 1/56]
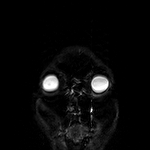
[im 19/56]
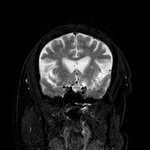
[im 37/56]
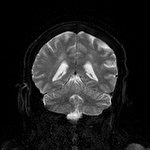
[im 56/56]
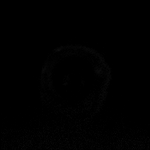

[Series 14: cor dwi_adc · coronal · 5.0mm · 1.53mm/px · 2 of 26 slices shown]
[im 1/26]
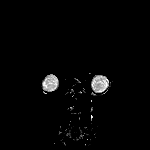
[im 26/26]
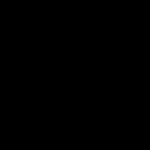

[Series 15: T2 · coronal · 5.0mm · 0.57mm/px · 3 of 35 slices shown (2 of 2)]
[im 1/35]
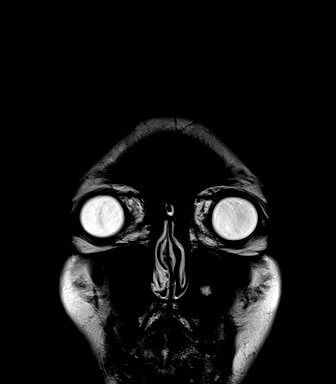
[im 18/35]
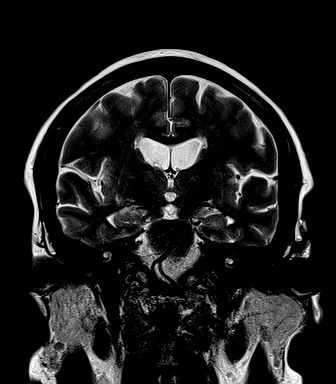
[im 35/35]
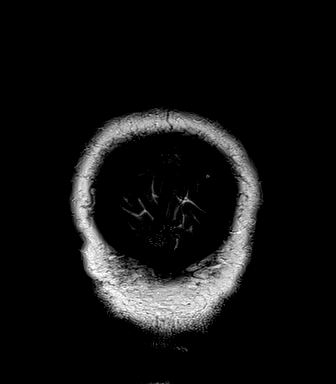

[48 of 48 positions shown; findings below may reference images not displayed]

FINDINGS: Brain: There is no acute infarction or intracranial hemorrhage.
There is no intracranial mass, mass effect, or edema. There is no
hydrocephalus or extra-axial fluid collection. Prominence of the
ventricles and sulci reflects minor parenchymal volume loss. Patchy
T2 hyperintensity in the supratentorial and pontine white matter is
nonspecific but may reflect mild chronic microvascular ischemic
changes. Small chronic infarct of the parasagittal left pons.

Vascular: Major vessel flow voids at the skull base are preserved.

Skull and upper cervical spine: Normal marrow signal is preserved.

Sinuses/Orbits: Mild mucosal thickening.  Orbits are unremarkable.

Other: Sella is partially empty.  Mastoid air cells are clear.
IMPRESSION: No acute infarction, hemorrhage, or mass.

Chronic microvascular ischemic changes. Small chronic infarct of the
left pons.

## 2021-07-11 IMAGING — CT CT HEAD W/O CM
3 series · 15 of 47 positions shown, 18 images · non-contrast
Comparison: None.

CLINICAL DATA: Neurological deficit.  Slurred speech.

EXAM:
CT HEAD WITHOUT CONTRAST
TECHNIQUE: Contiguous axial images were obtained from the base of the skull
through the vertex without intravenous contrast.

[Series 2: head wo · axial · 0.44mm/px · z∈[-172,-47]mm · 9 of 30 slices shown, 12 images]
[im 3/30  brain]
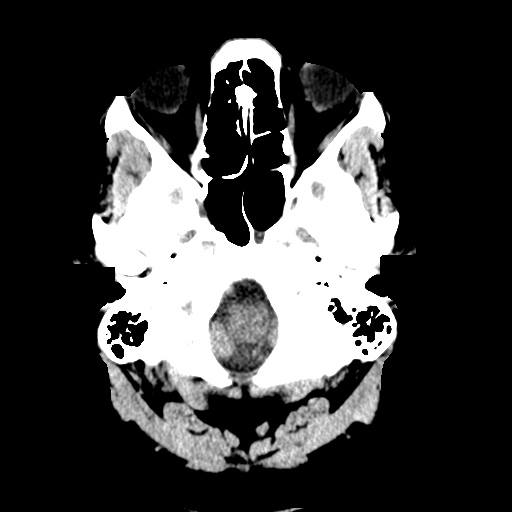
[im 3/30  bone]
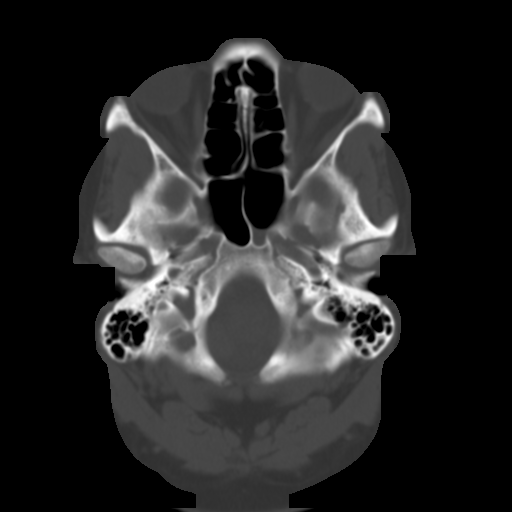
[im 6/30  brain]
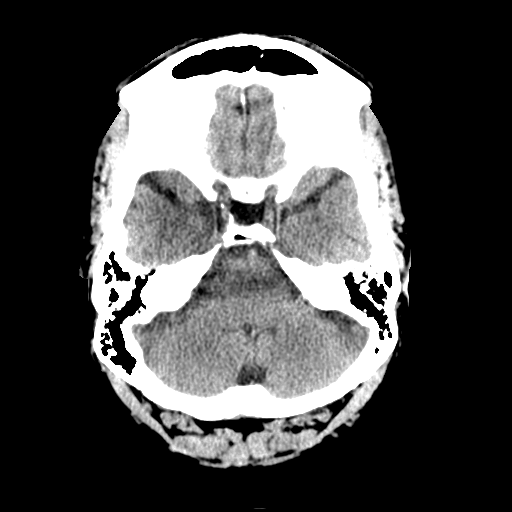
[im 9/30  brain]
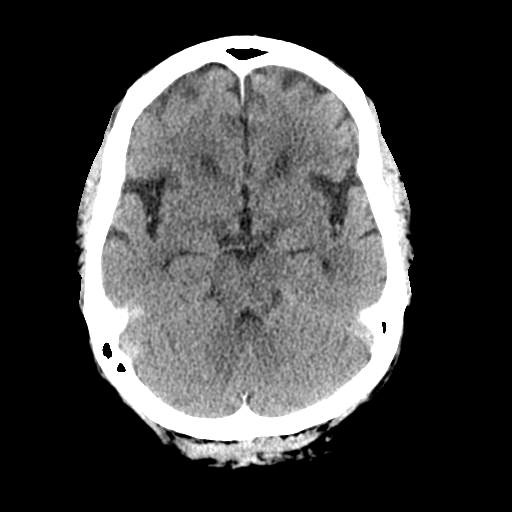
[im 12/30  brain]
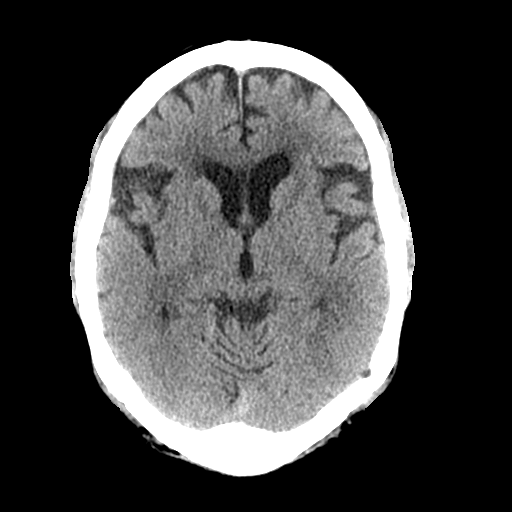
[im 16/30  brain]
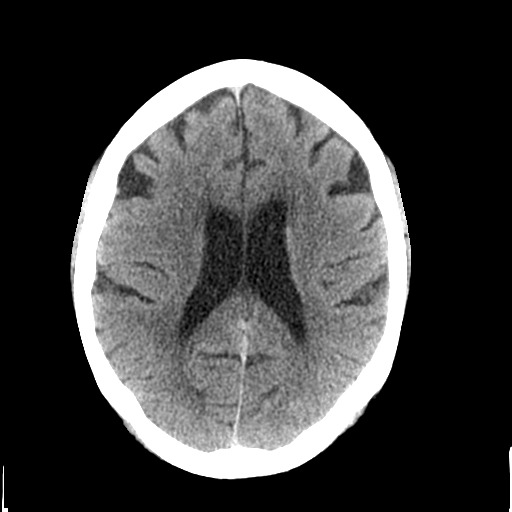
[im 16/30  bone]
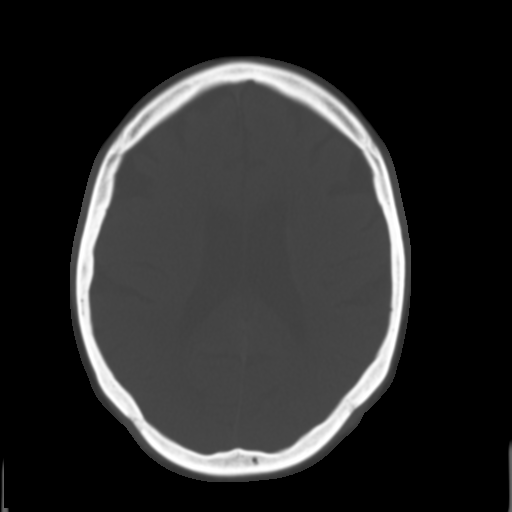
[im 19/30  brain]
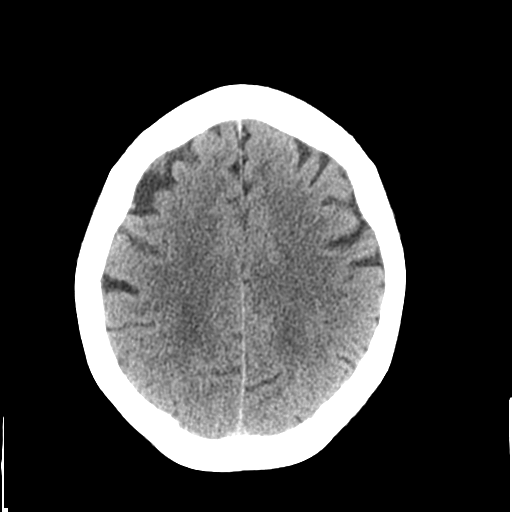
[im 22/30  brain]
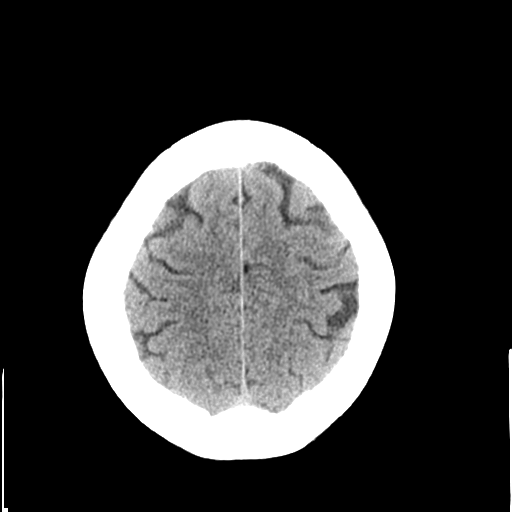
[im 25/30  brain]
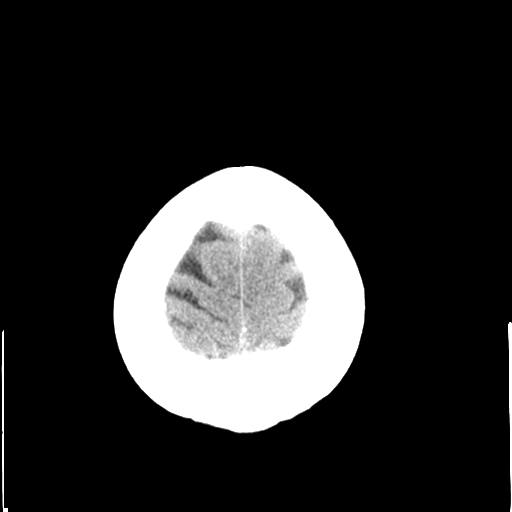
[im 28/30  brain]
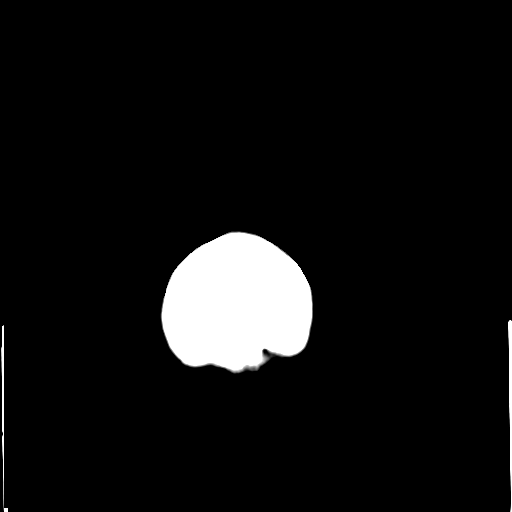
[im 28/30  bone]
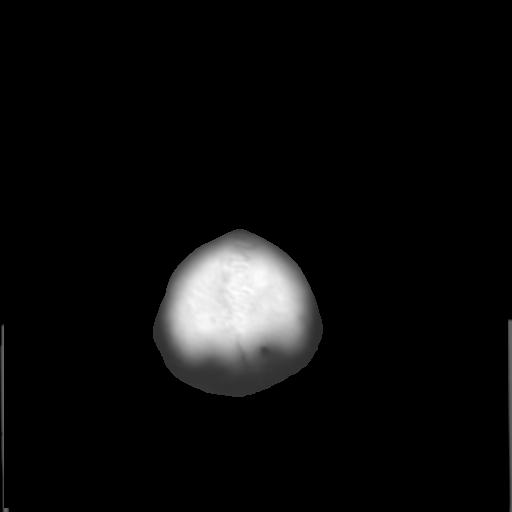

[Series 5: coronal soft tissue · coronal · 0.29mm/px · 3 of 68 slices shown]
[im 23/68  brain]
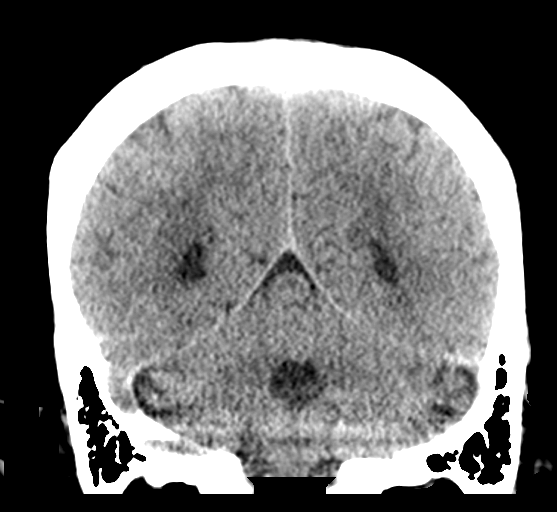
[im 30/68  brain]
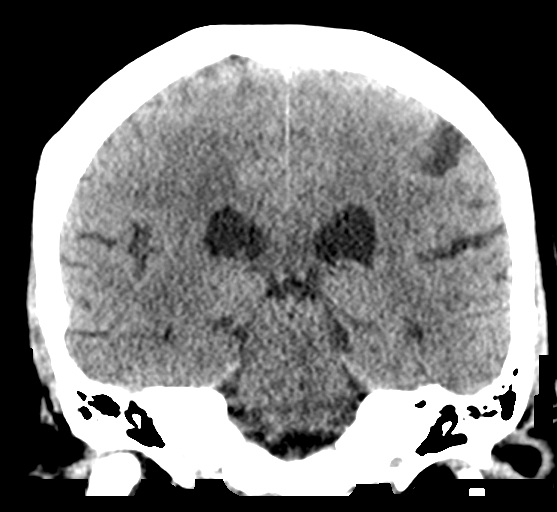
[im 38/68  brain]
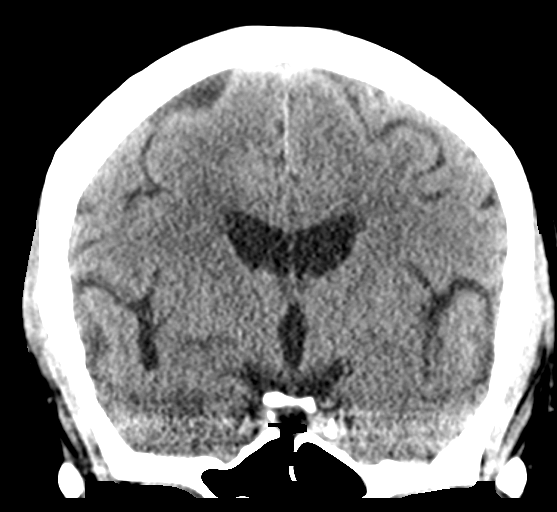

[Series 6: sagittal soft tissue · sagittal · 0.29mm/px · 3 of 55 slices shown]
[im 19/55  brain]
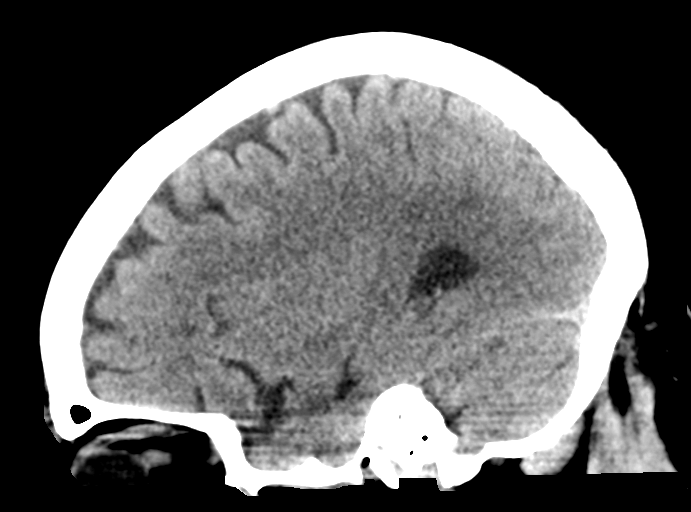
[im 28/55  brain]
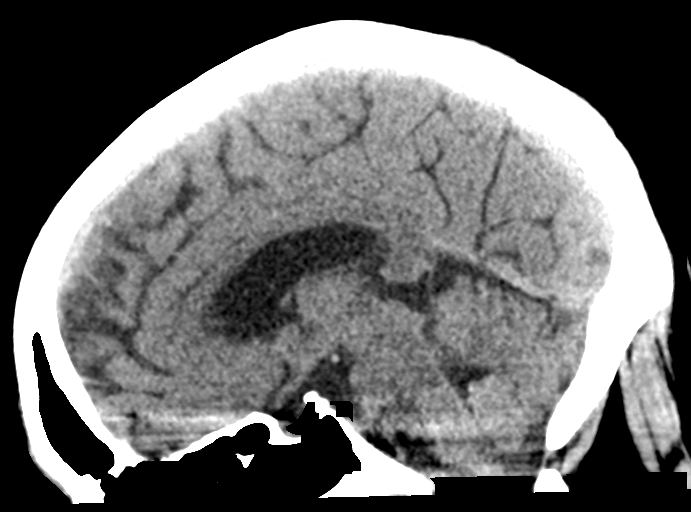
[im 37/55  brain]
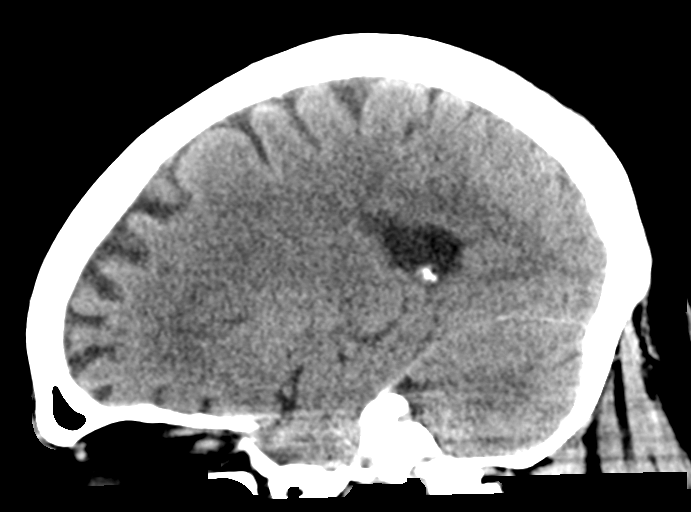

[15 of 47 positions shown; findings below may reference images not displayed]

FINDINGS: Brain: No evidence of acute infarction, hemorrhage, hydrocephalus,
extra-axial collection or mass lesion/mass effect. Periventricular
white matter low attenuation as can be seen with microvascular
disease.

Vascular: No hyperdense vessel or unexpected calcification.

Skull: No osseous abnormality.

Sinuses/Orbits: Mild left sphenoid sinus mucosal thickening.
Visualized mastoid sinuses are clear. Visualized orbits demonstrate
no focal abnormality.

Other: None
IMPRESSION: No acute intracranial pathology.

## 2021-07-11 MED ORDER — SODIUM CHLORIDE 0.9 % IV BOLUS
1000.0000 mL | Freq: Once | INTRAVENOUS | Status: AC
  Administered 2021-07-11: 1000 mL via INTRAVENOUS

## 2021-07-11 NOTE — ED Provider Notes (Signed)
[facility] DEPT Provider Note   CSN: [id number] Arrival date & time: 07/11/21  1149     History  Chief Complaint  Patient presents with   Aphasia   Weakness    Micheal Gomez is a 66 y.o. male.  Pt is a 66 yo bm with a hx of htn, high cholesterol, and htn.  The pt said he has had trouble speaking for several months.  He said he finally came today because his girlfriend convinced him to get checked.  He denies any other neurologic sx.  He is able to write and eat and find his words without any problems.      Home Medications Prior to Admission medications   Medication Sig Start Date End Date Taking? Authorizing Provider  amLODipine (NORVASC) 10 MG tablet Take 1 tablet (10 mg total) by mouth daily. 06/05/21 07/05/21  Dorie Rank, MD  aspirin EC 81 MG tablet Take 81 mg by mouth daily. Swallow whole.    [provider]  atorvastatin (LIPITOR) 20 MG tablet Take 20 mg by mouth daily.    [provider]  bismuth subsalicylate (PEPTO BISMOL) 262 MG/15ML suspension Take 30 mLs by mouth every 6 (six) hours as needed for diarrhea or loose stools or indigestion.    [provider]  blood glucose meter kit and supplies Dispense based on patient and insurance preference. Use up to four times daily as directed. (FOR ICD-9 250.00, 250.01). 08/21/15   Velvet Bathe, MD  doxepin (SINEQUAN) 50 MG capsule Take 50 mg by mouth at bedtime.    [provider]  glucose blood (FREESTYLE LITE) test strip Use as instructed 08/15/15   Dhungel, Nishant, MD  hydrochlorothiazide (HYDRODIURIL) 25 MG tablet Take 25 mg by mouth daily.    [provider]  HYDROcodone-acetaminophen (NORCO/VICODIN) 5-325 MG tablet Take 1 tablet by mouth every 6 (six) hours as needed. 06/05/21   Dorie Rank, MD  insulin aspart (NOVOLOG) 100 UNIT/ML injection Inject 30 Units into the skin 2 (two) times daily before a meal.    [provider]  insulin detemir  (LEVEMIR) 100 UNIT/ML injection Inject 0.16 mLs (16 Units total) into the skin daily. Patient taking differently: Inject 30 Units into the skin 2 (two) times daily. 08/21/15   Velvet Bathe, MD  insulin starter kit- pen needles MISC 1 kit by Other route once. 08/21/15   Velvet Bathe, MD  INSULIN SYRINGE .5CC/28G 28G X 1/2" 0.5 ML MISC 1 application by Does not apply route daily. 08/15/15   Dhungel, Flonnie Overman, MD  metFORMIN (GLUCOPHAGE-XR) 500 MG 24 hr tablet Take 1,000 mg by mouth 2 (two) times daily.    [provider]  metoCLOPramide (REGLAN) 5 MG tablet Take 1 tablet (5 mg total) by mouth every 8 (eight) hours as needed for nausea or vomiting. 01/17/21   [name], MD  omeprazole (PRILOSEC) 20 MG capsule Take 20 mg by mouth 2 (two) times daily.    [provider]  OVER THE COUNTER MEDICATION Take 2 tablets by mouth daily.    [provider]  Semaglutide, 1 MG/DOSE, (OZEMPIC, 1 MG/DOSE,) 2 MG/1.5ML SOPN Inject 1 mg into the skin every Saturday.    [provider]  traMADol (ULTRAM) 50 MG tablet Take 1-2 tablets (50-100 mg total) by mouth every 6 (six) hours as needed for moderate pain or severe pain. 01/11/21   [name], MD      Allergies    Nsaids    Review of  Systems   Review of Systems  Neurological:  Positive for speech difficulty.  All other systems reviewed and are negative.  Physical Exam Updated Vital Signs BP (!) 169/73    Pulse 96    Temp 97.8 F (36.6 C) (Oral)    Resp 14    Ht $R'5\' 9"'Vx$  (1.753 m)    Wt 126.1 kg    SpO2 99%    BMI 41.05 kg/m  Physical Exam Vitals and nursing note reviewed.  Constitutional:      Appearance: Normal appearance.  HENT:     Head: Normocephalic and atraumatic.     Right Ear: External ear normal.     Left Ear: External ear normal.     Nose: Nose normal.     Mouth/Throat:     Mouth: Mucous membranes are moist.     Pharynx: Oropharynx is clear.  Eyes:     Extraocular Movements: Extraocular movements intact.      Conjunctiva/sclera: Conjunctivae normal.     Pupils: Pupils are equal, round, and reactive to light.  Cardiovascular:     Rate and Rhythm: Normal rate and regular rhythm.     Pulses: Normal pulses.     Heart sounds: Normal heart sounds.  Pulmonary:     Effort: Pulmonary effort is normal.     Breath sounds: Normal breath sounds.  Abdominal:     General: Abdomen is flat. Bowel sounds are normal.     Palpations: Abdomen is soft.  Musculoskeletal:        General: Normal range of motion.     Cervical back: Normal range of motion and neck supple.  Skin:    General: Skin is warm.     Capillary Refill: Capillary refill takes less than 2 seconds.  Neurological:     General: No focal deficit present.     Mental Status: He is alert and oriented to person, place, and time.     Comments: dysarthria  Psychiatric:        Mood and Affect: Mood normal.        Behavior: Behavior normal.    ED Results / Procedures / Treatments   Labs (all labs ordered are listed, but only abnormal results are displayed) Labs Reviewed  COMPREHENSIVE METABOLIC PANEL - Abnormal; Notable for the following components:      Result Value   Glucose, Bld 109 (*)    BUN 24 (*)    Creatinine, Ser 1.45 (*)    Total Protein 8.3 (*)    GFR, Estimated 53 (*)    All other components within normal limits  CBC WITH DIFFERENTIAL/PLATELET    EKG None  Radiology CT HEAD WO CONTRAST (5MM)  Result Date: 07/11/2021 CLINICAL DATA:  Neurological deficit.  Slurred speech. EXAM: CT HEAD WITHOUT CONTRAST TECHNIQUE: Contiguous axial images were obtained from the base of the skull through the vertex without intravenous contrast. COMPARISON:  None. FINDINGS: Brain: No evidence of acute infarction, hemorrhage, hydrocephalus, extra-axial collection or mass lesion/mass effect. Periventricular white matter low attenuation as can be seen with microvascular disease. Vascular: No hyperdense vessel or unexpected calcification. Skull: No  osseous abnormality. Sinuses/Orbits: Mild left sphenoid sinus mucosal thickening. Visualized mastoid sinuses are clear. Visualized orbits demonstrate no focal abnormality. Other: None IMPRESSION: No acute intracranial pathology. Electronically Signed   By: Kathreen Devoid M.D.   On: 07/11/2021 12:40    Procedures Procedures    Medications Ordered in ED Medications  sodium chloride 0.9 % bolus 1,000 mL (1,000 mLs Intravenous  New Bag/Given 07/11/21 1339)    ED Course/ Medical Decision Making/ A&P                           Medical Decision Making  Labs and Xrays reviewed.  Pt has some mild AKI.  He is given IVFs and MRI ordered.  Pt signed out to Dr. Darl Householder at shift change.        Final Clinical Impression(s) / ED Diagnoses Final diagnoses:  Dysarthria    Rx / DC Orders ED Discharge Orders     None         Isla Pence, MD 07/11/21 1517

## 2021-07-11 NOTE — Discharge Instructions (Signed)
Your MRI today did not show a stroke.  Please follow-up with neurology and ENT doctor to figure out why you have trouble speaking  Return to ER if you have worse trouble speaking, weakness, numbness

## 2021-07-11 NOTE — ED Provider Notes (Signed)
°  Physical Exam  BP (!) 151/72    Pulse 93    Temp 97.8 F (36.6 C) (Oral)    Resp 16    Ht 5\' 9"  (1.753 m)    Wt 126.1 kg    SpO2 97%    BMI 41.05 kg/m   Physical Exam  Procedures  Procedures  ED Course / MDM    Medical Decision Making  Care assumed at 3 pm. Patient states that he has been having trouble speaking going on for the last several months.  Signout pending MRI brain  3:59 PM MRI brain showed no stroke.  Will refer to neurology outpatient and possibly to ENT to figure out if he has dysarthria versus expressive aphasia.       Drenda Freeze, MD 07/11/21 1600

## 2021-07-11 NOTE — ED Provider Triage Note (Signed)
Emergency Medicine Provider Triage Evaluation Note  Micheal Gomez , a 66 y.o. male  was evaluated in triage.  Pt complains of slurred speech, blurred vision, and generalized weakness.  States that symptoms have been present for 6 months or longer.  Symptoms have been unchanged over this time.  Patient states that he came to the emergency department today at the behest of his significant other.  Patient reports that he drove himself today without any issue.  Denies any recent falls or injuries.  Review of Systems  Positive: Blurred vision, slurred speech, generalized weakness Negative: Numbness, focal weakness, chest pain, shortness of breath, vision loss, headache  Physical Exam  BP (!) 147/86 (BP Location: Right Arm)    Pulse (!) 122    Temp 97.8 F (36.6 C) (Oral)    Resp 16    Ht 5\' 9"  (1.753 m)    Wt 126.1 kg    SpO2 97%    BMI 41.05 kg/m  Gen:   Awake, no distress   Resp:  Normal effort, lungs clear to auscultation bilaterally. MSK:   Moves extremities without difficulty  Other:  No facial asymmetry.  +5 strength to bilateral upper and lower extremities.  Pronator drift negative.  Dysarthria present  Medical Decision Making  Medically screening exam initiated at 12:18 PM.  Appropriate orders placed.  Micheal Gomez was informed that the remainder of the evaluation will be completed by another provider, this initial triage assessment does not replace that evaluation, and the importance of remaining in the ED until their evaluation is complete.     Loni Beckwith, [place] 07/11/21 1220

## 2021-07-11 NOTE — ED Triage Notes (Signed)
Patient reports that he has had slurred speech, blurred vision, and weakness x 2 weeks, but worse today. Patient drove himself to the hospital.

## 2021-07-13 ENCOUNTER — Ambulatory Visit (INDEPENDENT_AMBULATORY_CARE_PROVIDER_SITE_OTHER): Payer: Medicare PPO | Admitting: Neurology

## 2021-07-13 ENCOUNTER — Encounter: Payer: Self-pay | Admitting: Neurology

## 2021-07-13 VITALS — BP 138/87 | HR 113 | Ht 69.0 in | Wt 252.5 lb

## 2021-07-13 DIAGNOSIS — R4781 Slurred speech: Secondary | ICD-10-CM | POA: Diagnosis not present

## 2021-07-13 DIAGNOSIS — I639 Cerebral infarction, unspecified: Secondary | ICD-10-CM

## 2021-07-13 DIAGNOSIS — M778 Other enthesopathies, not elsewhere classified: Secondary | ICD-10-CM | POA: Insufficient documentation

## 2021-07-13 DIAGNOSIS — R471 Dysarthria and anarthria: Secondary | ICD-10-CM | POA: Diagnosis not present

## 2021-07-13 DIAGNOSIS — F4310 Post-traumatic stress disorder, unspecified: Secondary | ICD-10-CM | POA: Insufficient documentation

## 2021-07-13 DIAGNOSIS — M25519 Pain in unspecified shoulder: Secondary | ICD-10-CM | POA: Insufficient documentation

## 2021-07-13 NOTE — Patient Instructions (Addendum)
Continue current medications  Referral to speech therapy  Follow up with ENT as scheduled  Restart baby aspirin 81 mg  Follow up with your PCP and return if worse

## 2021-07-13 NOTE — Progress Notes (Signed)
[facility]  PATIENT: Micheal Gomez DOB: Sep 22, 1955  REQUESTING CLINICIAN: [facility] HISTORY FROM: Patient and girlfriend Caren Griffins over the phone  REASON FOR VISIT: Slurred speech    HISTORICAL  CHIEF COMPLAINT:  Chief Complaint  Patient presents with   New Patient (Initial Visit)    Rm 13. Alone. NP/internal ED referral for dysarthria vs expressive aphasia Pt c/o difficulty walking.    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  Patient report slurred for more than 10 years. He first noticed the slurred speech starting in 2010.  He reports he was in [place] taking pictures and felt a loud swishing sound and since then has been having trouble with his speech.  Girlfriend Caren Griffins over the phone reported speech has been slurred most of the time but 3 weeks ago he has been worse to the point that now he is mumbling and is very difficult to understand him.  He denies any trouble swallowing, no chocking spells. With the slurred speech he also reports some difficulty sleeping at night, he reported he was prescribed a medication at the [place] but currently is not taking it.  He also reported generalized weakness.  Patient reported history of syphilis which was treated.  He denies any word finding difficulty, he is not aphasic.    OTHER MEDICAL CONDITIONS: DMII, HTN, HLD, History of Syphilis    REVIEW OF SYSTEMS: Full 14 system review of systems performed and negative with exception of: as noted in the HPI   ALLERGIES: Allergies  Allergen Reactions   Nsaids Other (See Comments)    H/o ulcers     HOME MEDICATIONS: Outpatient Medications Prior to Visit  Medication Sig Dispense Refill   aspirin EC 81 MG tablet Take 81 mg by mouth  daily. Swallow whole.     atorvastatin (LIPITOR) 20 MG tablet Take 20 mg by mouth daily.     bismuth subsalicylate (PEPTO BISMOL) 262 MG/15ML suspension Take 30 mLs by mouth every 6 (six) hours as needed for diarrhea or loose stools or indigestion.     blood glucose meter kit and supplies Dispense based on patient and insurance preference. Use up to four times daily as directed. (FOR ICD-9 250.00, 250.01). 1 each 0   doxepin (SINEQUAN) 50 MG capsule Take 50 mg by mouth at bedtime.     glucose blood (FREESTYLE LITE) test strip Use as instructed 100 each 12   hydrochlorothiazide (HYDRODIURIL) 25 MG tablet Take 25 mg by mouth daily.     HYDROcodone-acetaminophen (NORCO/VICODIN) 5-325 MG tablet Take 1 tablet by mouth every 6 (six) hours as needed. 12 tablet 0   insulin aspart (NOVOLOG) 100 UNIT/ML injection Inject 30 Units into the skin 2 (two) times daily before a meal.     insulin detemir (LEVEMIR) 100 UNIT/ML injection Inject 0.16 mLs (16 Units total) into the skin daily. (Patient taking differently: Inject 30 Units into the skin 2 (two) times daily.) 10 mL 0   insulin starter kit- pen needles MISC 1 kit by Other route once. 1 each 1   INSULIN SYRINGE .5CC/28G 28G X 1/2" 0.5 ML MISC 1 application by Does not apply route daily. 100 each 0   metFORMIN (GLUCOPHAGE-XR) 500 MG 24 hr tablet Take 1,000 mg by mouth 2 (two) times daily.  metoCLOPramide (REGLAN) 5 MG tablet Take 1 tablet (5 mg total) by mouth every 8 (eight) hours as needed for nausea or vomiting. 20 tablet 1   omeprazole (PRILOSEC) 20 MG capsule Take 20 mg by mouth 2 (two) times daily.     OVER THE COUNTER MEDICATION Take 2 tablets by mouth daily.     Semaglutide, 1 MG/DOSE, (OZEMPIC, 1 MG/DOSE,) 2 MG/1.5ML SOPN Inject 1 mg into the skin every Saturday.     traMADol (ULTRAM) 50 MG tablet Take 1-2 tablets (50-100 mg total) by mouth every 6 (six) hours as needed for moderate pain or severe pain. 20 tablet 0   amLODipine (NORVASC) 10 MG  tablet Take 1 tablet (10 mg total) by mouth daily. 30 tablet 0   No facility-administered medications prior to visit.    PAST MEDICAL HISTORY: Past Medical History:  Diagnosis Date   Acute encephalopathy    Acute renal failure ([place])    COVID-19    11-29-20   Diabetes mellitus without complication (HCC)    Diabetic ketoacidosis with coma associated with type 2 diabetes mellitus ([place])    DKA (diabetic ketoacidoses) 08/05/2015   High cholesterol    History of stomach ulcers    Hypertension     PAST SURGICAL HISTORY: Past Surgical History:  Procedure Laterality Date   APPENDECTOMY     polyp removal     TONSILLECTOMY      FAMILY HISTORY: Family History  Problem Relation Age of Onset   Diabetes Father    Hypertension Father    Colon cancer Neg Hx    Stomach cancer Neg Hx    Rectal cancer Neg Hx    Esophageal cancer Neg Hx     SOCIAL HISTORY: Social History   Socioeconomic History   Marital status: Single    Spouse name: Not on file   Number of children: Not on file   Years of education: Not on file   Highest education level: Not on file  Occupational History   Not on file  Tobacco Use   Smoking status: Former    Types: Cigarettes   Smokeless tobacco: Never  Vaping Use   Vaping Use: Never used  Substance and Sexual Activity   Alcohol use: No   Drug use: No   Sexual activity: Not on file  Other Topics Concern   Not on file  Social History Narrative   [facility]   Retired from [employer]   Social Determinants of Radio broadcast assistant Strain: Not on file  Food Insecurity: Not on file  Transportation Needs: Not on file  Physical Activity: Not on file  Stress: Not on file  Social Connections: Not on file  Intimate Partner Violence: Not on file    PHYSICAL EXAM  GENERAL EXAM/CONSTITUTIONAL: Vitals:  Vitals:   07/13/21 0825  BP: 138/87  Pulse: (!) 113  Weight: 252 lb 8 oz (114.5 kg)  Height: $Remove'5\' 9"'[id number]$  (1.753 m)   Body mass index is 37.29  kg/m. Wt Readings from Last 3 Encounters:  07/13/21 252 lb 8 oz (114.5 kg)  07/11/21 278 lb (126.1 kg)  06/05/21 262 lb (118.8 kg)   Patient is in no distress; well developed, nourished and groomed; neck is supple  CARDIOVASCULAR: Examination of carotid arteries is normal; no carotid bruits Regular rate and rhythm, no murmurs Examination of peripheral vascular system by observation and palpation is normal  EYES: Pupils round and reactive to light, Visual fields full to confrontation, Extraocular movements  intacts,   MUSCULOSKELETAL: Gait, strength, tone, movements noted in Neurologic exam below  NEUROLOGIC: MENTAL STATUS:  No flowsheet data found. awake, alert, oriented to person, place and time recent and remote memory intact normal attention and concentration language fluent, comprehension intact, naming intact, he has moderate dysarthria. He is able to name, repeat and comprehend, he is not aphasic.  fund of knowledge appropriate  CRANIAL NERVE:  2nd, 3rd, 4th, 6th - pupils equal and reactive to light, visual fields full to confrontation, extraocular muscles intact, no nystagmus 5th - facial sensation symmetric 7th - facial strength symmetric 8th - hearing intact 9th - palate elevates symmetrically, uvula midline 11th - shoulder shrug symmetric 12th - tongue protrusion midline  MOTOR:  normal bulk and tone, full strength in the BUE, BLE  SENSORY:  normal and symmetric to light touch, pinprick, temperature, vibration  COORDINATION:  finger-nose-finger, fine finger movements normal  REFLEXES:  deep tendon reflexes present and symmetric  GAIT/STATION:  normal     DIAGNOSTIC DATA (LABS, IMAGING, TESTING) - I reviewed patient records, labs, notes, testing and imaging myself where available.  Lab Results  Component Value Date   WBC 7.7 07/11/2021   HGB 13.9 07/11/2021   HCT 43.5 07/11/2021   MCV 83.3 07/11/2021   PLT 243 07/11/2021      Component Value  Date/Time   NA 138 07/11/2021 1235   K 3.6 07/11/2021 1235   CL 103 07/11/2021 1235   CO2 22 07/11/2021 1235   GLUCOSE 109 (H) 07/11/2021 1235   BUN 24 (H) 07/11/2021 1235   CREATININE 1.45 (H) 07/11/2021 1235   CALCIUM 9.1 07/11/2021 1235   PROT 8.3 (H) 07/11/2021 1235   ALBUMIN 3.9 07/11/2021 1235   AST 16 07/11/2021 1235   ALT 17 07/11/2021 1235   ALKPHOS 113 07/11/2021 1235   BILITOT 0.6 07/11/2021 1235   GFRNONAA 53 (L) 07/11/2021 1235   GFRAA >60 05/04/2017 1928   No results found for: CHOL, HDL, LDLCALC, LDLDIRECT, TRIG, CHOLHDL Lab Results  Component Value Date   HGBA1C 7.8 (H) 11/21/2020   Lab Results  Component Value Date   HWTUUEKC00 349 08/09/2015   Lab Results  Component Value Date   TSH 2.20 07/05/2020    MRI Brain without contrast  No acute infarction, hemorrhage, or mass. Chronic microvascular ischemic changes. 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    ASSESSMENT AND PLAN  66 y.o. year old male with vascular risk factor including hypertension, hyperlipidemia, insulin-dependent diabetes mellitus who is presenting with more than 10-year history of slurred speech which is worse in the past 3 weeks.  Patient has moderate dysarthria, he is able to name, repeat and comprehend, he is not aphasic.  He denies any trouble swallowing and no choking episode.  During his recent ED visit he had a brain MRI that showed a old left pontine stroke and chronic microvascular ischemic changes.  I have explained to the patient that pontine stroke can affect your speech, can make you slurred and dysarthric but I am not sure when he had a stroke. It is likely that his slurred speech is related to his previous pontine stroke but difficult to say 100%.  I will refer him to speech therapy.  Due to his chronic microvascular ischemic changes I also recommended patient to start with baby aspirin daily.  Follow-up with your doctors at the [place] and return to clinic if worse.   1. Slurred  speech   2. Left pontine stroke ([place])  3. Dysarthria     Patient Instructions  Continue current medications  Referral to speech therapy  Follow up with ENT as scheduled  Restart baby aspirin 81 mg  Follow up with your PCP and return if worse   Orders Placed This Encounter  Procedures   Ambulatory referral to Speech Therapy    No orders of the defined types were placed in this encounter.   Return if symptoms worsen or fail to improve.    Alric Ran, MD 07/13/2021, 9:39 AM  [facility] [address] [place], [place] [address] [phone]

## 2021-07-19 ENCOUNTER — Emergency Department (HOSPITAL_COMMUNITY): Payer: No Typology Code available for payment source

## 2021-07-19 ENCOUNTER — Inpatient Hospital Stay (HOSPITAL_COMMUNITY)
Admission: EM | Admit: 2021-07-19 | Discharge: 2021-07-21 | DRG: 683 | Disposition: A | Discharge status: Home Health | Payer: No Typology Code available for payment source | Attending: Internal Medicine | Admitting: Internal Medicine

## 2021-07-19 ENCOUNTER — Encounter (HOSPITAL_COMMUNITY): Payer: Self-pay | Admitting: Emergency Medicine

## 2021-07-19 ENCOUNTER — Observation Stay (HOSPITAL_COMMUNITY): Payer: No Typology Code available for payment source

## 2021-07-19 ENCOUNTER — Other Ambulatory Visit: Payer: Self-pay

## 2021-07-19 DIAGNOSIS — I6930 Unspecified sequelae of cerebral infarction: Secondary | ICD-10-CM

## 2021-07-19 DIAGNOSIS — N179 Acute kidney failure, unspecified: Secondary | ICD-10-CM | POA: Diagnosis not present

## 2021-07-19 DIAGNOSIS — Z20822 Contact with and (suspected) exposure to covid-19: Secondary | ICD-10-CM | POA: Diagnosis present

## 2021-07-19 DIAGNOSIS — Z28310 Unvaccinated for covid-19: Secondary | ICD-10-CM

## 2021-07-19 DIAGNOSIS — E662 Morbid (severe) obesity with alveolar hypoventilation: Secondary | ICD-10-CM | POA: Diagnosis present

## 2021-07-19 DIAGNOSIS — G4733 Obstructive sleep apnea (adult) (pediatric): Secondary | ICD-10-CM

## 2021-07-19 DIAGNOSIS — Z7982 Long term (current) use of aspirin: Secondary | ICD-10-CM

## 2021-07-19 DIAGNOSIS — Z8249 Family history of ischemic heart disease and other diseases of the circulatory system: Secondary | ICD-10-CM

## 2021-07-19 DIAGNOSIS — Z833 Family history of diabetes mellitus: Secondary | ICD-10-CM

## 2021-07-19 DIAGNOSIS — R42 Dizziness and giddiness: Secondary | ICD-10-CM | POA: Diagnosis not present

## 2021-07-19 DIAGNOSIS — Z79899 Other long term (current) drug therapy: Secondary | ICD-10-CM

## 2021-07-19 DIAGNOSIS — E1143 Type 2 diabetes mellitus with diabetic autonomic (poly)neuropathy: Secondary | ICD-10-CM | POA: Diagnosis not present

## 2021-07-19 DIAGNOSIS — I69322 Dysarthria following cerebral infarction: Secondary | ICD-10-CM

## 2021-07-19 DIAGNOSIS — E860 Dehydration: Secondary | ICD-10-CM | POA: Diagnosis not present

## 2021-07-19 DIAGNOSIS — K3184 Gastroparesis: Secondary | ICD-10-CM | POA: Diagnosis present

## 2021-07-19 DIAGNOSIS — K5900 Constipation, unspecified: Secondary | ICD-10-CM | POA: Diagnosis present

## 2021-07-19 DIAGNOSIS — Z9989 Dependence on other enabling machines and devices: Secondary | ICD-10-CM

## 2021-07-19 DIAGNOSIS — R269 Unspecified abnormalities of gait and mobility: Secondary | ICD-10-CM

## 2021-07-19 DIAGNOSIS — I10 Essential (primary) hypertension: Secondary | ICD-10-CM | POA: Diagnosis present

## 2021-07-19 DIAGNOSIS — E7800 Pure hypercholesterolemia, unspecified: Secondary | ICD-10-CM | POA: Diagnosis present

## 2021-07-19 DIAGNOSIS — E119 Type 2 diabetes mellitus without complications: Secondary | ICD-10-CM

## 2021-07-19 DIAGNOSIS — R471 Dysarthria and anarthria: Secondary | ICD-10-CM

## 2021-07-19 DIAGNOSIS — E236 Other disorders of pituitary gland: Secondary | ICD-10-CM | POA: Diagnosis present

## 2021-07-19 DIAGNOSIS — Z794 Long term (current) use of insulin: Secondary | ICD-10-CM

## 2021-07-19 DIAGNOSIS — I639 Cerebral infarction, unspecified: Secondary | ICD-10-CM

## 2021-07-19 DIAGNOSIS — Z7984 Long term (current) use of oral hypoglycemic drugs: Secondary | ICD-10-CM

## 2021-07-19 DIAGNOSIS — Z6837 Body mass index (BMI) 37.0-37.9, adult: Secondary | ICD-10-CM

## 2021-07-19 DIAGNOSIS — Z87891 Personal history of nicotine dependence: Secondary | ICD-10-CM

## 2021-07-19 LAB — CBC
HCT: 43.8 % (ref 39.0–52.0)
Hemoglobin: 14.1 g/dL (ref 13.0–17.0)
MCH: 27 pg (ref 26.0–34.0)
MCHC: 32.2 g/dL (ref 30.0–36.0)
MCV: 83.9 fL (ref 80.0–100.0)
Platelets: 240 10*3/uL (ref 150–400)
RBC: 5.22 MIL/uL (ref 4.22–5.81)
RDW: 15 % (ref 11.5–15.5)
WBC: 6.8 10*3/uL (ref 4.0–10.5)
nRBC: 0 % (ref 0.0–0.2)

## 2021-07-19 LAB — TROPONIN I (HIGH SENSITIVITY): Troponin I (High Sensitivity): 7 ng/L (ref <18)

## 2021-07-19 LAB — MAGNESIUM: Magnesium: 2.4 mg/dL (ref 1.7–2.4)

## 2021-07-19 LAB — CBG MONITORING, ED: Glucose-Capillary: 88 mg/dL (ref 70–99)

## 2021-07-19 LAB — RESP PANEL BY RT-PCR (FLU A&B, COVID) ARPGX2
Influenza A by PCR: NEGATIVE
Influenza B by PCR: NEGATIVE
SARS Coronavirus 2 by RT PCR: NEGATIVE

## 2021-07-19 LAB — COMPREHENSIVE METABOLIC PANEL
ALT: 15 U/L (ref 0–44)
AST: 17 U/L (ref 15–41)
Albumin: 4.1 g/dL (ref 3.5–5.0)
Alkaline Phosphatase: 113 U/L (ref 38–126)
Anion gap: 9 (ref 5–15)
BUN: 57 mg/dL — ABNORMAL HIGH (ref 8–23)
CO2: 22 mmol/L (ref 22–32)
Calcium: 9.1 mg/dL (ref 8.9–10.3)
Chloride: 105 mmol/L (ref 98–111)
Creatinine, Ser: 3.19 mg/dL — ABNORMAL HIGH (ref 0.61–1.24)
GFR, Estimated: 21 mL/min — ABNORMAL LOW (ref >60)
Glucose, Bld: 101 mg/dL — ABNORMAL HIGH (ref 70–99)
Potassium: 3.4 mmol/L — ABNORMAL LOW (ref 3.5–5.1)
Sodium: 136 mmol/L (ref 135–145)
Total Bilirubin: 0.7 mg/dL (ref 0.3–1.2)
Total Protein: 8.2 g/dL — ABNORMAL HIGH (ref 6.5–8.1)

## 2021-07-19 LAB — HEPATIC FUNCTION PANEL
ALT: 14 U/L (ref 0–44)
AST: 16 U/L (ref 15–41)
Albumin: 3.4 g/dL — ABNORMAL LOW (ref 3.5–5.0)
Alkaline Phosphatase: 98 U/L (ref 38–126)
Bilirubin, Direct: 0.2 mg/dL (ref 0.0–0.2)
Indirect Bilirubin: 0.4 mg/dL (ref 0.3–0.9)
Total Bilirubin: 0.6 mg/dL (ref 0.3–1.2)
Total Protein: 6.8 g/dL (ref 6.5–8.1)

## 2021-07-19 LAB — DIFFERENTIAL
Abs Immature Granulocytes: 0.02 10*3/uL (ref 0.00–0.07)
Basophils Absolute: 0 10*3/uL (ref 0.0–0.1)
Basophils Relative: 1 %
Eosinophils Absolute: 0.2 10*3/uL (ref 0.0–0.5)
Eosinophils Relative: 2 %
Immature Granulocytes: 0 %
Lymphocytes Relative: 30 %
Lymphs Abs: 2.1 10*3/uL (ref 0.7–4.0)
Monocytes Absolute: 0.4 10*3/uL (ref 0.1–1.0)
Monocytes Relative: 6 %
Neutro Abs: 4.2 10*3/uL (ref 1.7–7.7)
Neutrophils Relative %: 61 %

## 2021-07-19 LAB — TSH: TSH: 2.607 u[IU]/mL (ref 0.350–4.500)

## 2021-07-19 LAB — PHOSPHORUS: Phosphorus: 4.2 mg/dL (ref 2.5–4.6)

## 2021-07-19 LAB — GLUCOSE, CAPILLARY: Glucose-Capillary: 95 mg/dL (ref 70–99)

## 2021-07-19 IMAGING — CT CT HEAD W/O CM
3 series · 14 of 47 positions shown, 16 images · non-contrast
Comparison: CT head [DATE].  MRI [DATE]

CLINICAL DATA: Acute neuro deficit.



[Series 2: head wo · axial · 0.47mm/px · z∈[+1530,+1656]mm · 8 of 30 slices shown, 10 images]
[im 3/30  brain]
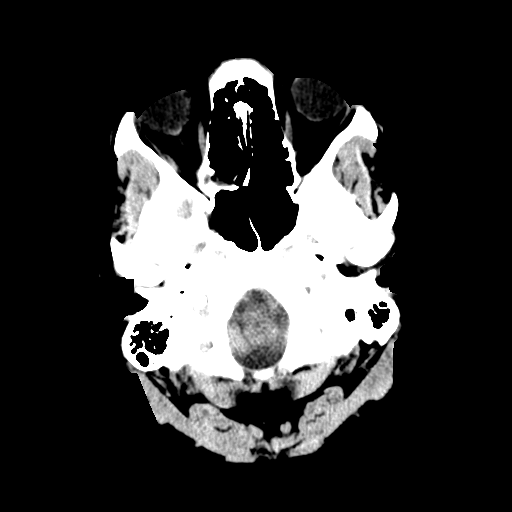
[im 3/30  bone]
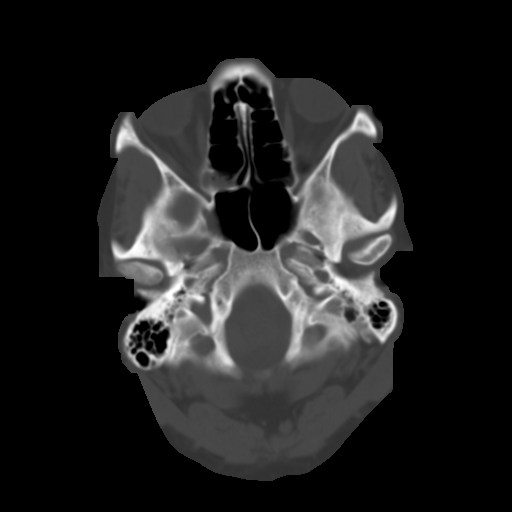
[im 7/30  brain]
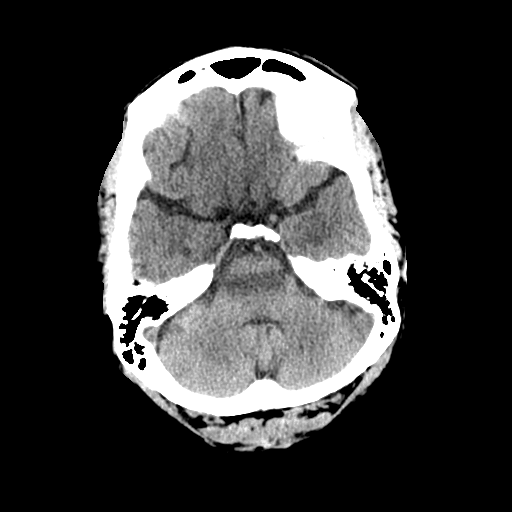
[im 10/30  brain]
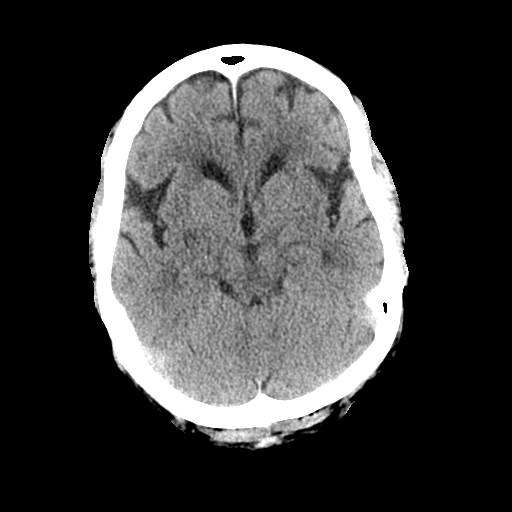
[im 14/30  brain]
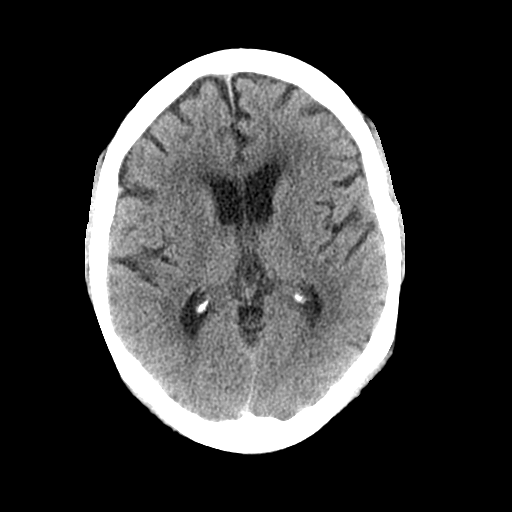
[im 17/30  brain]
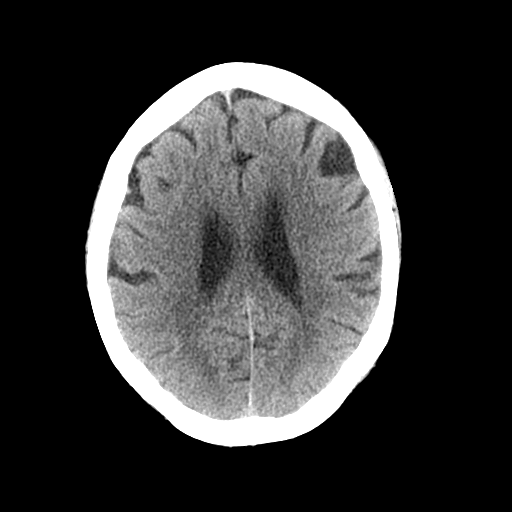
[im 17/30  bone]
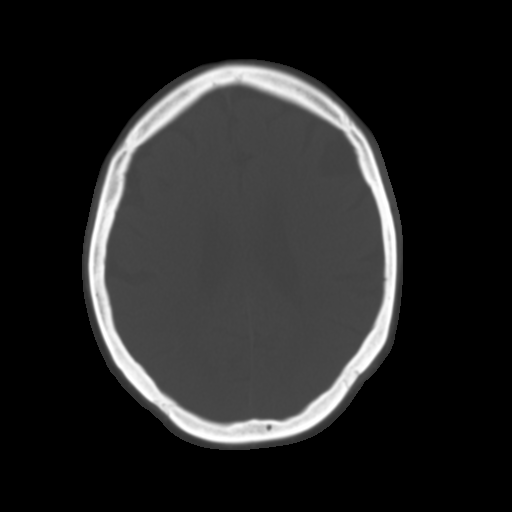
[im 21/30  brain]
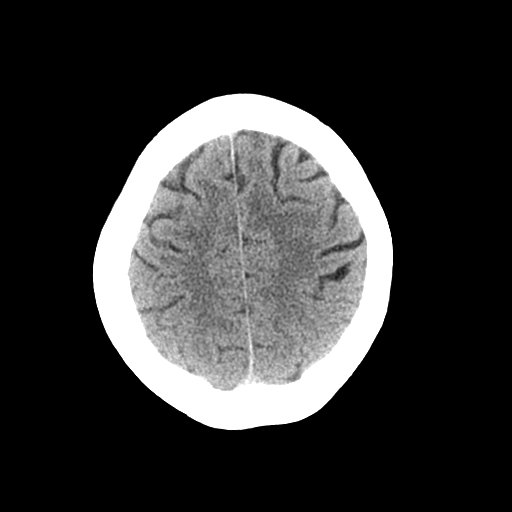
[im 24/30  brain]
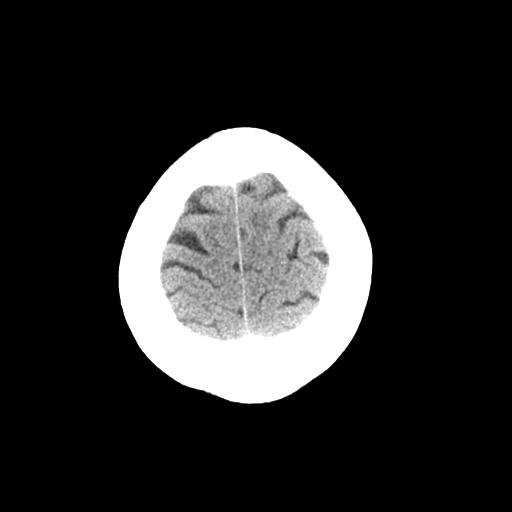
[im 28/30  brain]
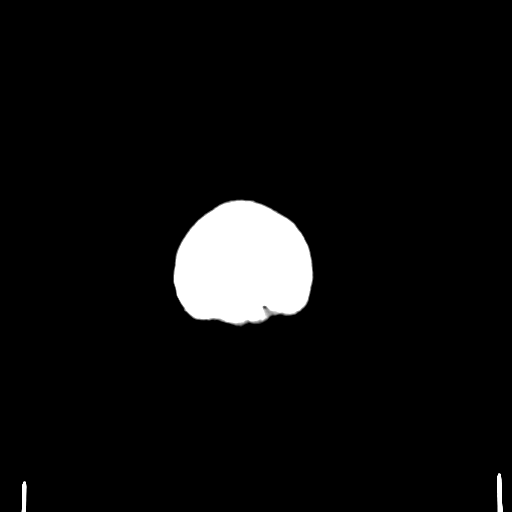

[Series 5: coronal soft tissue · coronal · 0.30mm/px · 3 of 75 slices shown]
[im 25/75  brain]
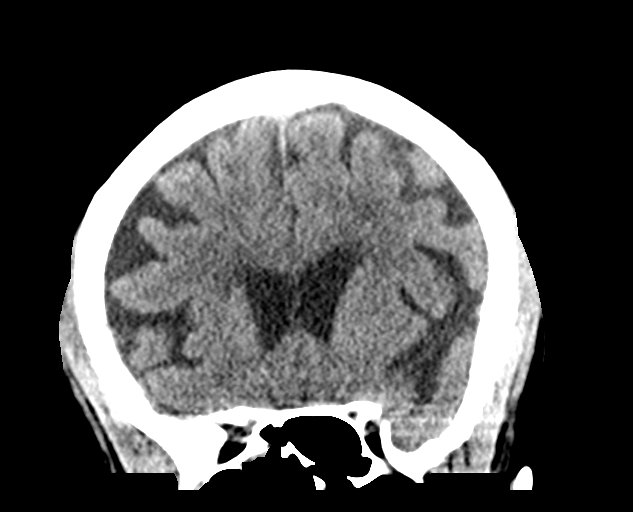
[im 33/75  brain]
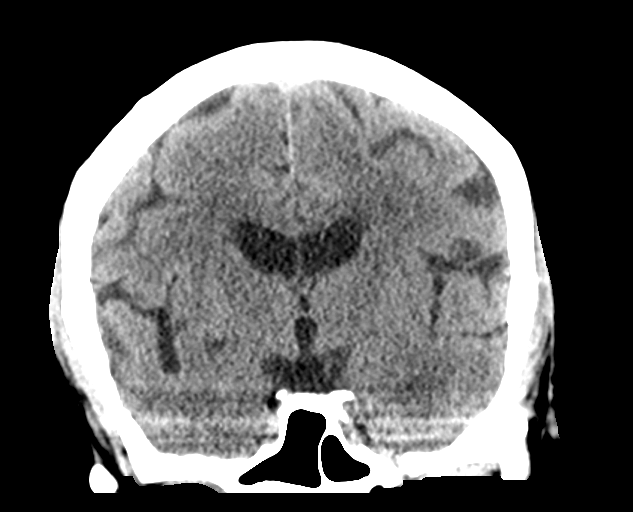
[im 42/75  brain]
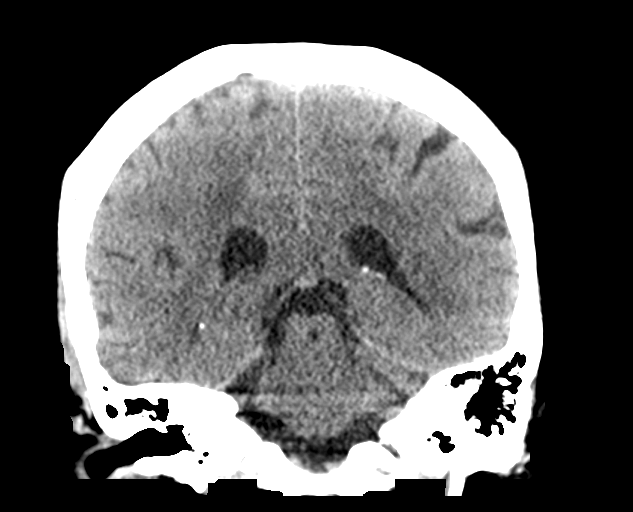

[Series 6: sagittal soft tissue · sagittal · 0.29mm/px · 3 of 67 slices shown]
[im 23/67  brain]
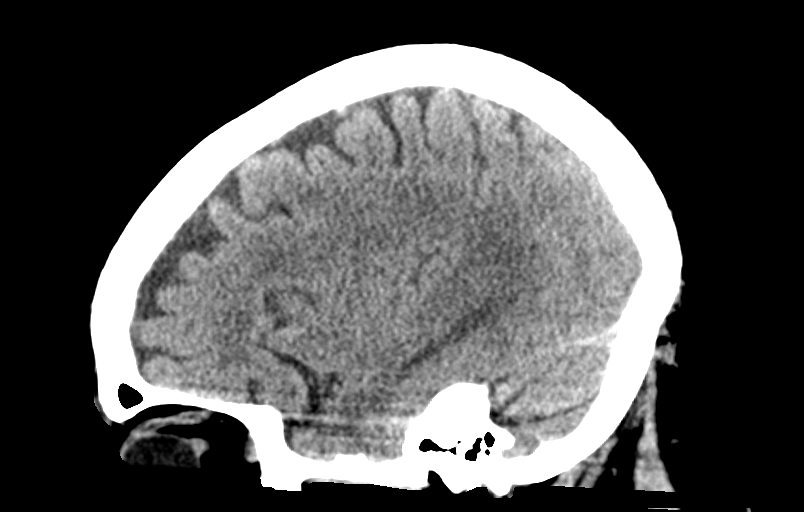
[im 34/67  brain]
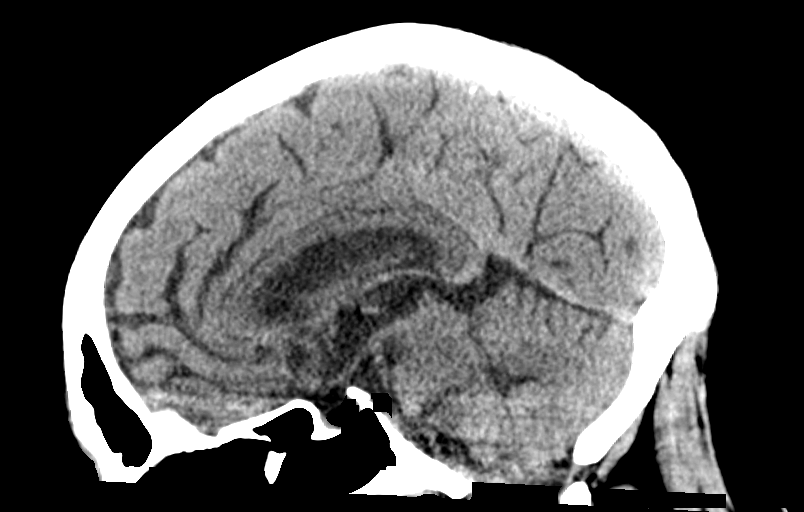
[im 45/67  brain]
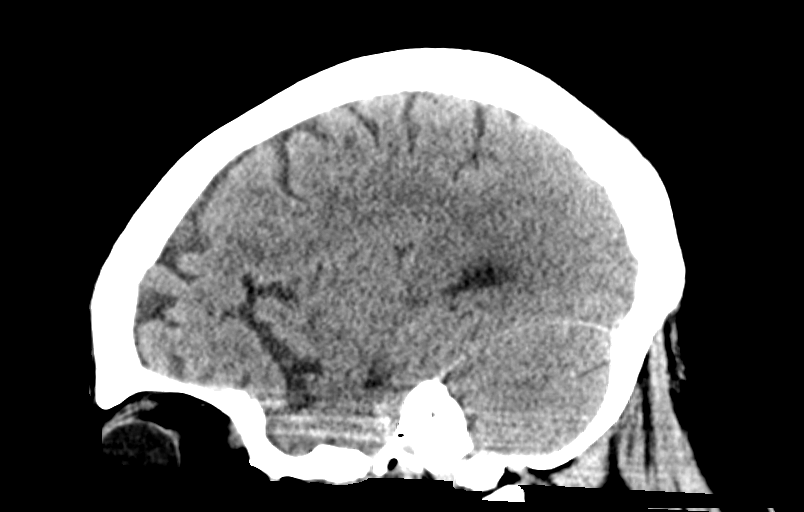

[14 of 47 positions shown; findings below may reference images not displayed]

FINDINGS: Brain: No evidence of acute infarction, hemorrhage, hydrocephalus,
extra-axial collection or mass lesion/mass effect. Mild white matter
hypodensity bilaterally similar to prior studies.

Vascular: Negative for hyperdense vessel

Skull: Negative

Sinuses/Orbits: Negative

Other: None
IMPRESSION: No acute abnormality. Chronic white matter changes consistent with
microvascular ischemia.

## 2021-07-19 IMAGING — MR MR MRA NECK WO/W CM
4 of 5 series · 26 of 48 positions shown · IV contrast (GADAVIST)
Comparison: Prior CT from earlier the same day as well as recent
MRI from [DATE].

CLINICAL DATA: Initial evaluation for neuro deficit, stroke
suspected.

EXAM:
MRI HEAD WITHOUT CONTRAST
MRA HEAD WITHOUT CONTRAST
MRA NECK WITHOUT AND WITH CONTRAST
TECHNIQUE: Multiplanar, multi-echo pulse sequences of the brain and surrounding
structures were acquired without intravenous contrast. Angiographic
images of the Circle of Willis were acquired using MRA technique
without intravenous contrast. Angiographic images of the neck were
acquired using MRA technique without and with intravenous contrast.
Carotid stenosis measurements (when applicable) are obtained
utilizing NASCET criteria, using the distal internal carotid
diameter as the denominator.
CONTRAST:  See injection documentation.

[Series 3: tof_2d_tra · axial · 3.5mm · 0.43mm/px · z∈[-81,+64]mm · 8 of 60 slices shown]
[im 1/60]
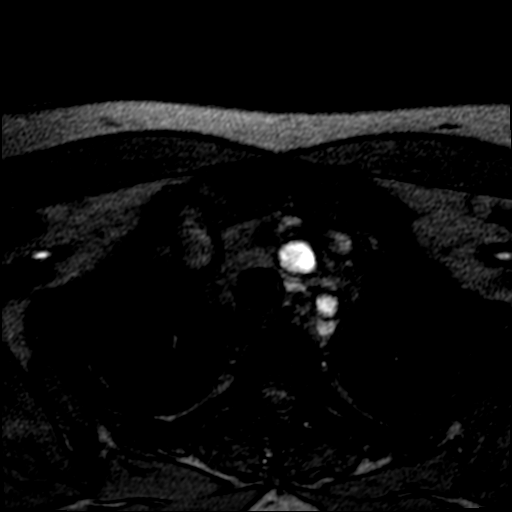
[im 9/60]
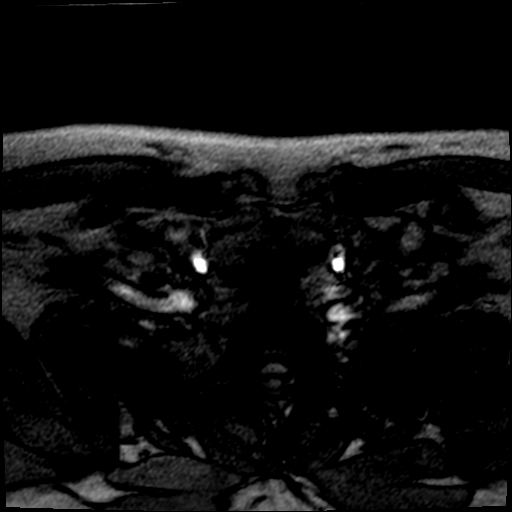
[im 17/60]
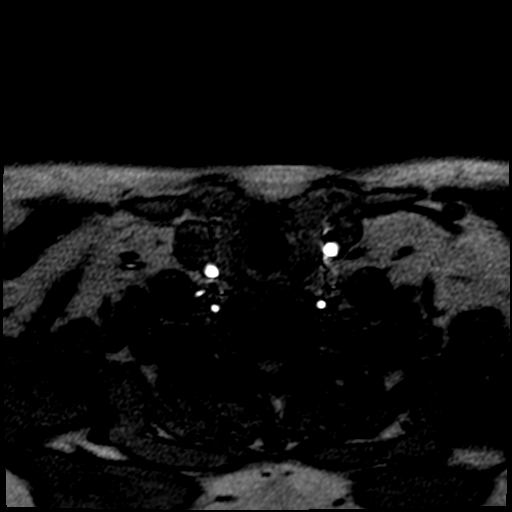
[im 26/60]
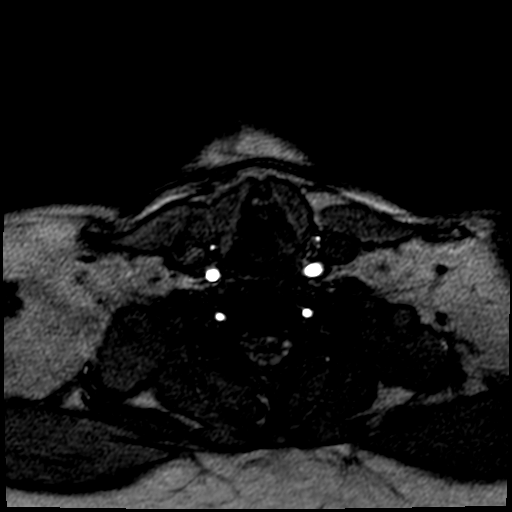
[im 34/60]
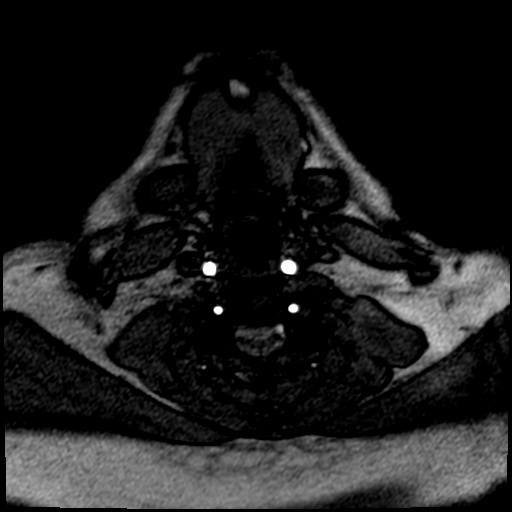
[im 43/60]
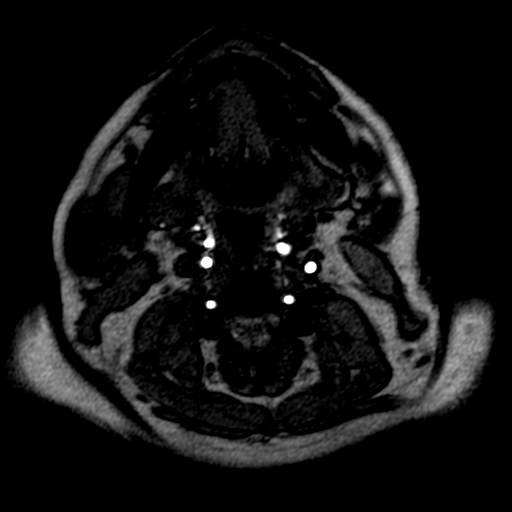
[im 51/60]
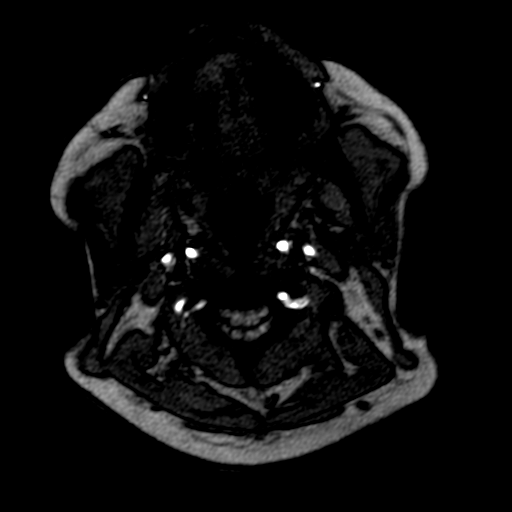
[im 60/60]
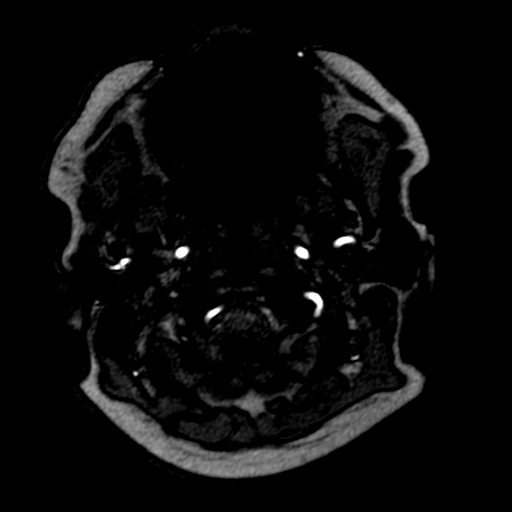

[Series 7: (id)_cor_pre · coronal · 0.8mm · 0.78mm/px · 9 of 96 slices shown]
[im 1/96]
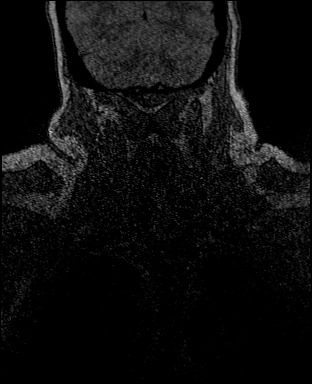
[im 16/96]
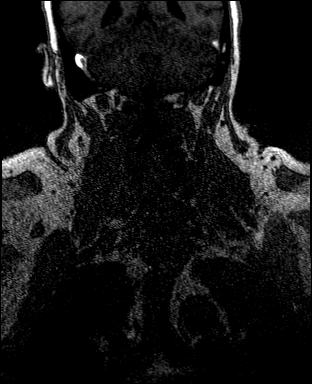
[im 32/96]
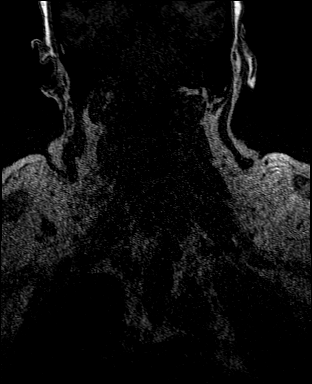
[im 40/96]
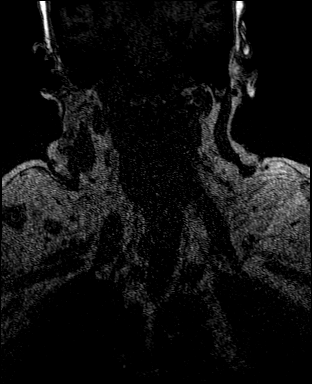
[im 48/96]
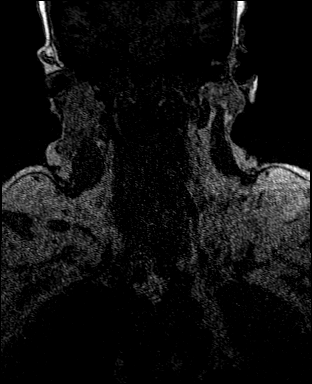
[im 56/96]
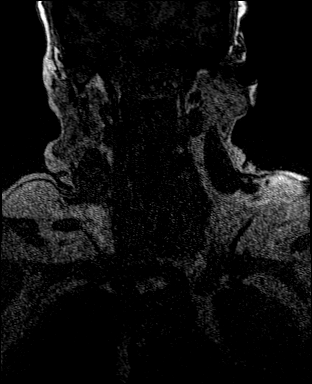
[im 64/96]
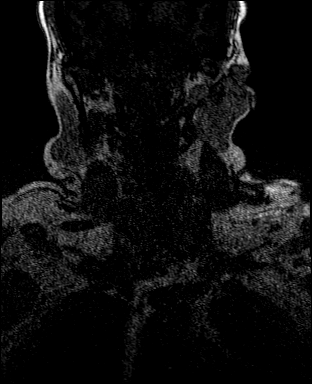
[im 80/96]
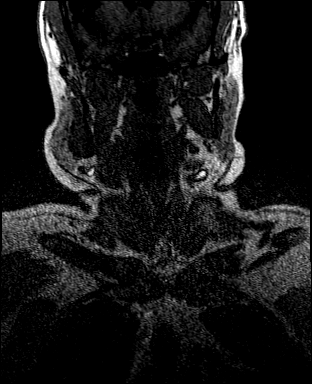
[im 96/96]
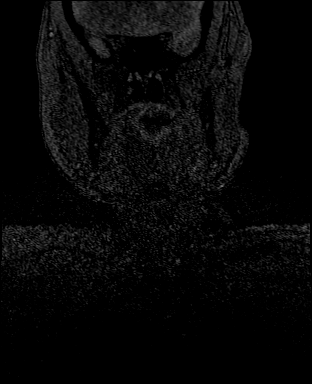

[Series 9: (id)_cor_post · coronal · 0.8mm · 0.78mm/px · 6 of 95 slices shown]
[im 1/95]
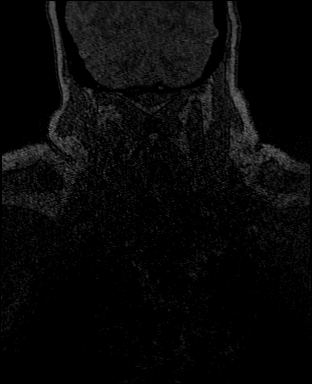
[im 16/95]
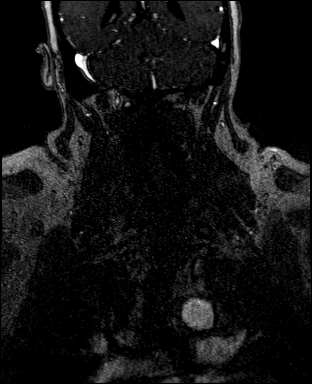
[im 32/95]
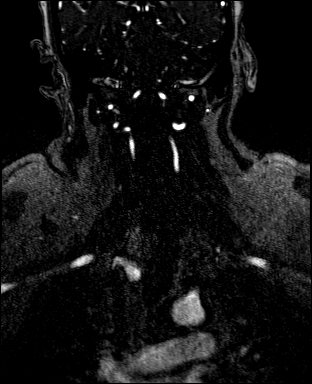
[im 40/95]
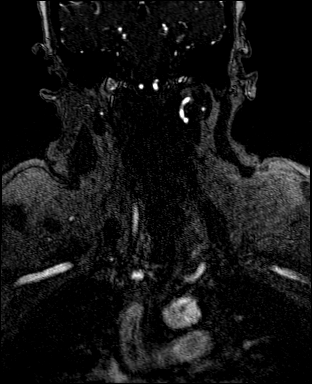
[im 48/95]
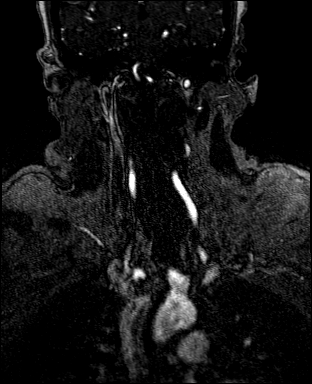
[im 79/95]
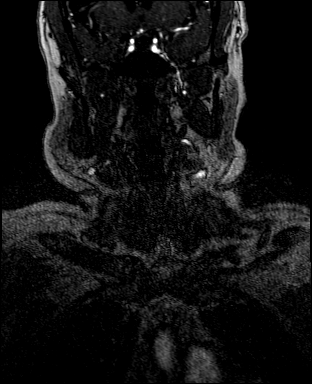

[Series 10: (id)_cor_post_sub · coronal · 0.8mm · 0.78mm/px · 3 of 96 slices shown]
[im 16/96]
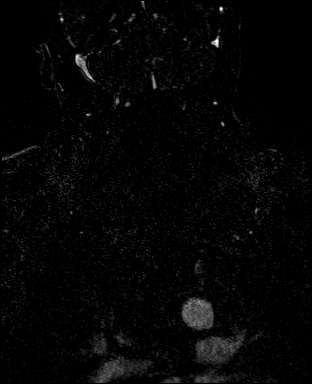
[im 48/96]
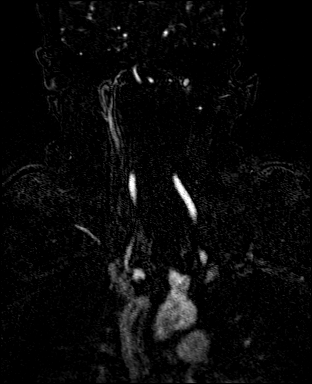
[im 80/96]
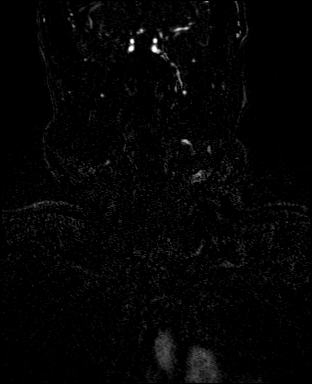

[26 of 48 positions shown; findings below may reference images not displayed]

FINDINGS: MRI HEAD FINDINGS

Brain: Cerebral volume within normal limits. Patchy T2/FLAIR
hyperintensity involving the periventricular deep white matter both
cerebral hemispheres most consistent with chronic small vessel
ischemic disease, moderate in nature. Small remote left pontine
lacunar infarct again noted.

No abnormal foci of restricted diffusion to suggest acute or
subacute ischemia. Gray-white matter differentiation maintained. No
areas of chronic cortical infarction. No acute or chronic
intracranial blood products.

No mass lesion or midline shift. No hydrocephalus or extra-axial
fluid collection. Partially empty sella noted. Midline structures
intact.

Vascular: Major intracranial vascular flow voids are maintained.

Skull and upper cervical spine: Craniocervical junction within
normal limits. Bone marrow signal intensity diffusely decreased on
T1 weighted sequence, nonspecific, but most commonly related to
anemia, smoking or obesity. No focal marrow replacing lesion. No
scalp soft tissue abnormality.

Sinuses/Orbits: Bilateral axial myopia noted. Globes and orbital
soft tissues demonstrate no acute finding. Scattered mucosal
thickening noted about the ethmoidal air cells. Few small maxillary
sinus retention cyst noted. No mastoid effusion.

Other: None.

MRA HEAD FINDINGS

Anterior circulation: Visualized distal cervical segments of the
internal carotid arteries widely patent with antegrade flow.
Petrous, cavernous, and supraclinoid segments patent without
stenosis or other abnormality. A1 segments patent. Normal anterior
communicating complex. Anterior cerebral arteries patent without
stenosis. No M1 stenosis or occlusion. Normal MCA bifurcations.
Distal MCA branches well perfused and symmetric.

Posterior circulation: Vertebral arteries largely code dominant and
patent to the vertebrobasilar junction without stenosis. Both PICA
origins patent and normal. Basilar patent to its distal aspect
without stenosis. Superior cerebellar arteries patent bilaterally.
Both PCAs primarily supplied via the basilar. Prominent left
posterior communicating artery noted. Both PCAs well perfused to
their distal aspects without stenosis.

Anatomic variants: None significant. No aneurysm.

MRA NECK FINDINGS

Aortic arch: Visualized aortic arch normal in caliber. Bovine
branching pattern noted. No stenosis about the origin of the great
vessels.

Right carotid system: Right common and internal carotid arteries
patent without stenosis or evidence for dissection. No significant
atheromatous narrowing about the right carotid bulb.

Left carotid system: Left common and internal carotid arteries
patent without stenosis or evidence for dissection. No significant
atheromatous narrowing about the left carotid bulb.

Vertebral arteries: Both vertebral arteries arise from subclavian
arteries. No significant proximal subclavian artery stenosis.
Vertebral arteries patent without stenosis or evidence for
dissection.

Other: None
IMPRESSION: MRI HEAD:

1. No acute intracranial abnormality.
2. Moderate chronic microvascular ischemic disease, with small
remote left pontine lacunar infarct.
3. Partially empty sella.

MRA HEAD:

Normal intracranial MRA. No large vessel occlusion, hemodynamically
significant stenosis, or other acute vascular abnormality.

MRA NECK:

Normal MRA of the neck.

## 2021-07-19 IMAGING — MR MR HEAD W/O CM
10 series · 48 of 48 positions shown · IV contrast (agent unspecified)
Comparison: Prior CT from earlier the same day as well as recent
MRI from [DATE].

CLINICAL DATA: Initial evaluation for neuro deficit, stroke
suspected.

EXAM:
MRI HEAD WITHOUT CONTRAST
MRA HEAD WITHOUT CONTRAST
MRA NECK WITHOUT AND WITH CONTRAST
TECHNIQUE: Multiplanar, multi-echo pulse sequences of the brain and surrounding
structures were acquired without intravenous contrast. Angiographic
images of the Circle of Willis were acquired using MRA technique
without intravenous contrast. Angiographic images of the neck were
acquired using MRA technique without and with intravenous contrast.
Carotid stenosis measurements (when applicable) are obtained
utilizing NASCET criteria, using the distal internal carotid
diameter as the denominator.
CONTRAST:  See injection documentation.

[Series 5: DWI · axial · 3.0mm · 1.36mm/px · z∈[-57,+83]mm · 9 of 96 slices shown (1 of 2)]
[im 1/96]
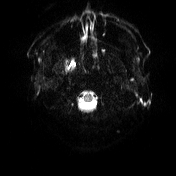
[im 12/96]
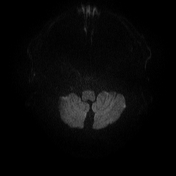
[im 24/96]
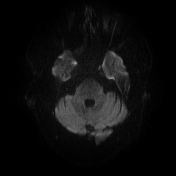
[im 36/96]
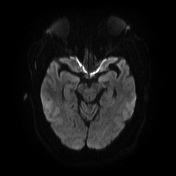
[im 48/96]
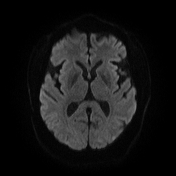
[im 60/96]
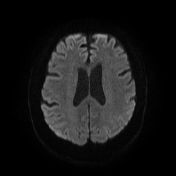
[im 72/96]
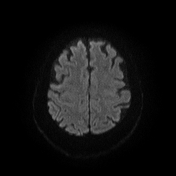
[im 84/96]
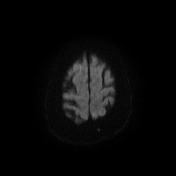
[im 96/96]
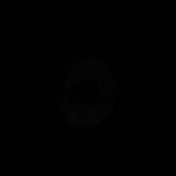

[Series 6: DWI · axial · 3.0mm · 1.36mm/px · z∈[-57,+83]mm · 4 of 48 slices shown (2 of 2)]
[im 1/48]
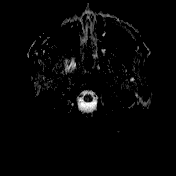
[im 16/48]
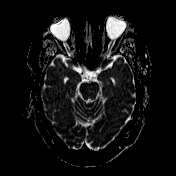
[im 32/48]
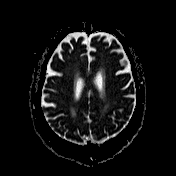
[im 48/48]
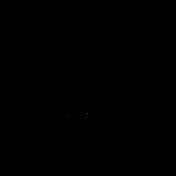

[Series 7: T1 · sagittal · 5.0mm · 0.75mm/px · 2 of 24 slices shown (1 of 2)]
[im 1/24]
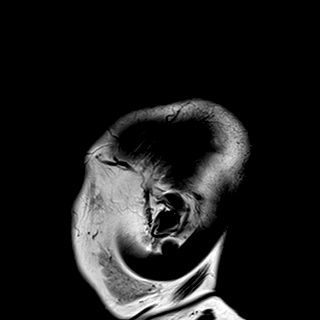
[im 24/24]
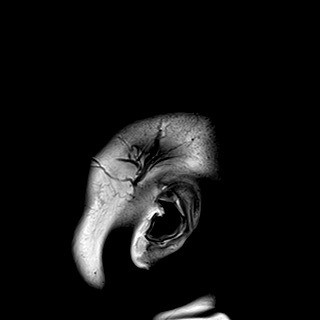

[Series 8: T2 · axial · 5.0mm · 0.62mm/px · z∈[-66,+96]mm · 2 of 26 slices shown (1 of 2)]
[im 1/26]
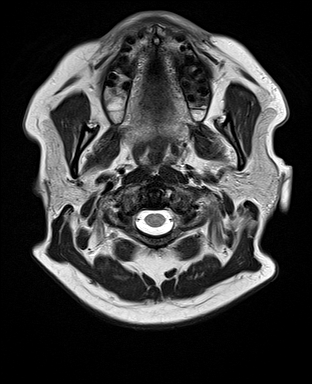
[im 26/26]
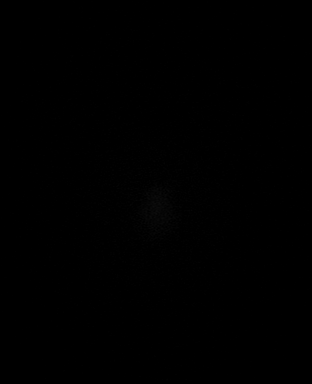

[Series 9: swi_images · axial · 3.0mm · 0.75mm/px · z∈[-67,+97]mm · 5 of 56 slices shown]
[im 1/56]
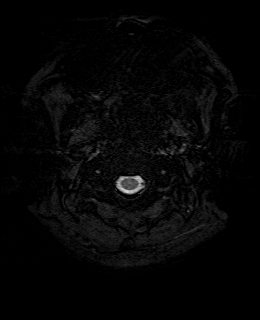
[im 14/56]
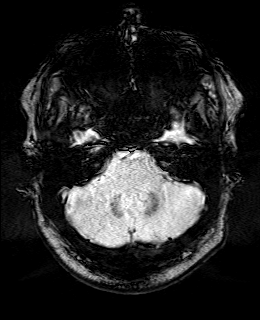
[im 28/56]
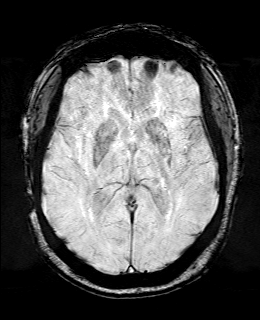
[im 42/56]
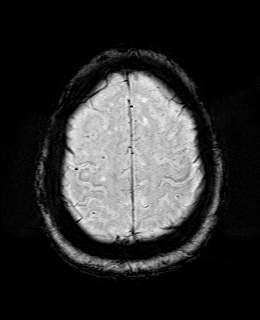
[im 56/56]
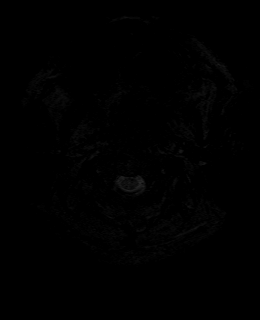

[Series 11: FLAIR · axial · 3.0mm · 0.75mm/px · z∈[-61,+91]mm · 4 of 52 slices shown]
[im 1/52]
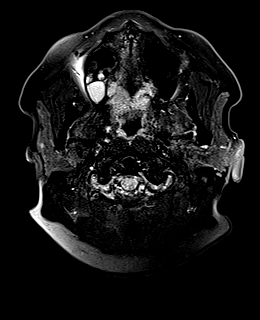
[im 18/52]
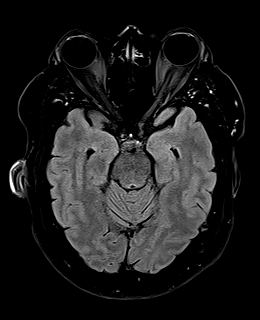
[im 35/52]
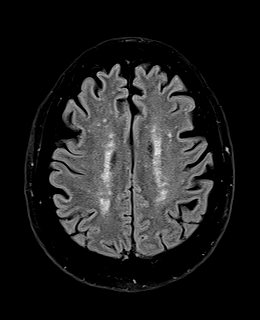
[im 52/52]
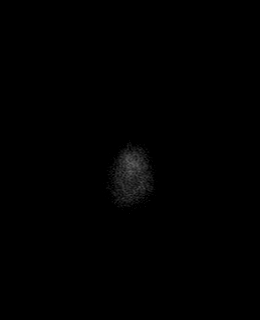

[Series 12: T1 · axial · 1.0mm · 0.94mm/px · z∈[-64,+94]mm · 13 of 160 slices shown (2 of 2)]
[im 1/160]
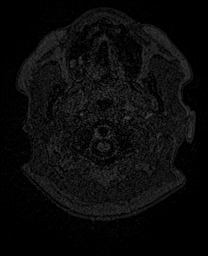
[im 14/160]
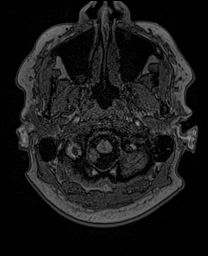
[im 27/160]
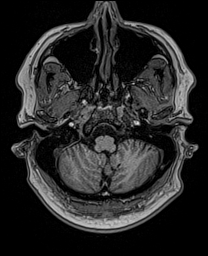
[im 40/160]
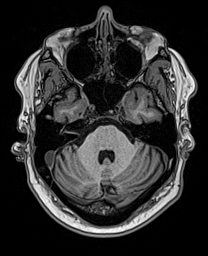
[im 54/160]
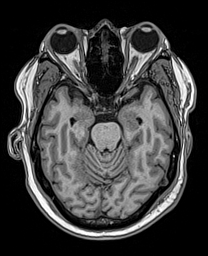
[im 67/160]
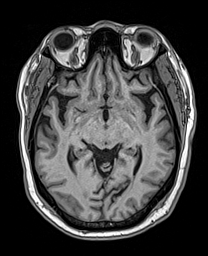
[im 80/160]
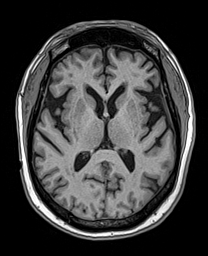
[im 93/160]
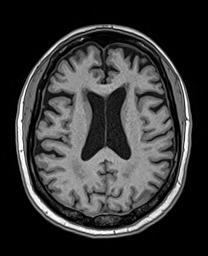
[im 107/160]
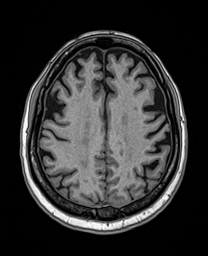
[im 120/160]
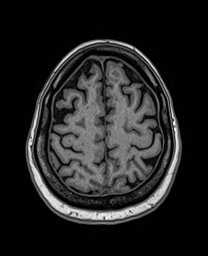
[im 133/160]
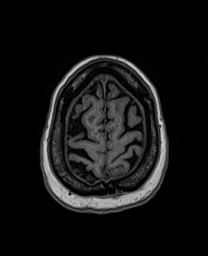
[im 146/160]
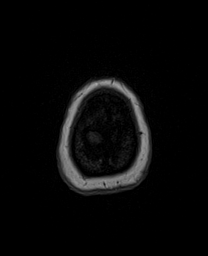
[im 160/160]
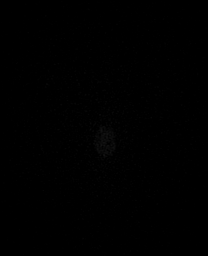

[Series 13: cor dwi_tracew · coronal · 5.0mm · 1.53mm/px · 5 of 56 slices shown]
[im 1/56]
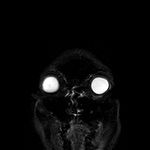
[im 14/56]
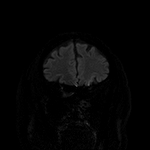
[im 28/56]
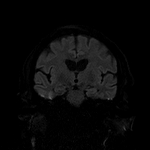
[im 42/56]
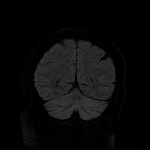
[im 56/56]
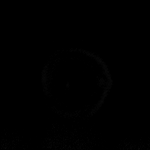

[Series 14: cor dwi_adc · coronal · 5.0mm · 1.53mm/px · 2 of 28 slices shown]
[im 1/28]
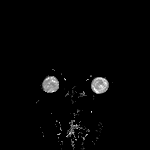
[im 28/28]
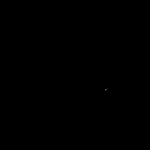

[Series 15: T2 · coronal · 5.0mm · 0.57mm/px · 2 of 28 slices shown (2 of 2)]
[im 1/28]
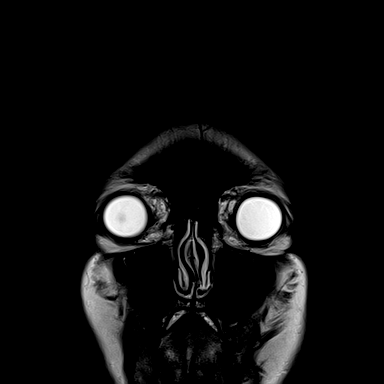
[im 28/28]
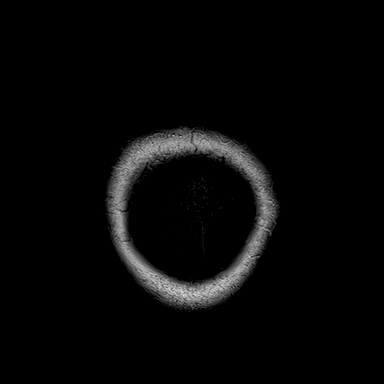

[48 of 48 positions shown; findings below may reference images not displayed]

FINDINGS: MRI HEAD FINDINGS

Brain: Cerebral volume within normal limits. Patchy T2/FLAIR
hyperintensity involving the periventricular deep white matter both
cerebral hemispheres most consistent with chronic small vessel
ischemic disease, moderate in nature. Small remote left pontine
lacunar infarct again noted.

No abnormal foci of restricted diffusion to suggest acute or
subacute ischemia. Gray-white matter differentiation maintained. No
areas of chronic cortical infarction. No acute or chronic
intracranial blood products.

No mass lesion or midline shift. No hydrocephalus or extra-axial
fluid collection. Partially empty sella noted. Midline structures
intact.

Vascular: Major intracranial vascular flow voids are maintained.

Skull and upper cervical spine: Craniocervical junction within
normal limits. Bone marrow signal intensity diffusely decreased on
T1 weighted sequence, nonspecific, but most commonly related to
anemia, smoking or obesity. No focal marrow replacing lesion. No
scalp soft tissue abnormality.

Sinuses/Orbits: Bilateral axial myopia noted. Globes and orbital
soft tissues demonstrate no acute finding. Scattered mucosal
thickening noted about the ethmoidal air cells. Few small maxillary
sinus retention cyst noted. No mastoid effusion.

Other: None.

MRA HEAD FINDINGS

Anterior circulation: Visualized distal cervical segments of the
internal carotid arteries widely patent with antegrade flow.
Petrous, cavernous, and supraclinoid segments patent without
stenosis or other abnormality. A1 segments patent. Normal anterior
communicating complex. Anterior cerebral arteries patent without
stenosis. No M1 stenosis or occlusion. Normal MCA bifurcations.
Distal MCA branches well perfused and symmetric.

Posterior circulation: Vertebral arteries largely code dominant and
patent to the vertebrobasilar junction without stenosis. Both PICA
origins patent and normal. Basilar patent to its distal aspect
without stenosis. Superior cerebellar arteries patent bilaterally.
Both PCAs primarily supplied via the basilar. Prominent left
posterior communicating artery noted. Both PCAs well perfused to
their distal aspects without stenosis.

Anatomic variants: None significant. No aneurysm.

MRA NECK FINDINGS

Aortic arch: Visualized aortic arch normal in caliber. Bovine
branching pattern noted. No stenosis about the origin of the great
vessels.

Right carotid system: Right common and internal carotid arteries
patent without stenosis or evidence for dissection. No significant
atheromatous narrowing about the right carotid bulb.

Left carotid system: Left common and internal carotid arteries
patent without stenosis or evidence for dissection. No significant
atheromatous narrowing about the left carotid bulb.

Vertebral arteries: Both vertebral arteries arise from subclavian
arteries. No significant proximal subclavian artery stenosis.
Vertebral arteries patent without stenosis or evidence for
dissection.

Other: None
IMPRESSION: MRI HEAD:

1. No acute intracranial abnormality.
2. Moderate chronic microvascular ischemic disease, with small
remote left pontine lacunar infarct.
3. Partially empty sella.

MRA HEAD:

Normal intracranial MRA. No large vessel occlusion, hemodynamically
significant stenosis, or other acute vascular abnormality.

MRA NECK:

Normal MRA of the neck.

## 2021-07-19 IMAGING — US US RENAL
1 series · 15 of 25 positions shown · non-contrast
Comparison: [DATE]

CLINICAL DATA: Acute kidney injury.

EXAM:
RENAL / URINARY TRACT ULTRASOUND COMPLETE

[Series 1: us renal mc & wl · 15 of 39 slices shown]
[im 1/39]
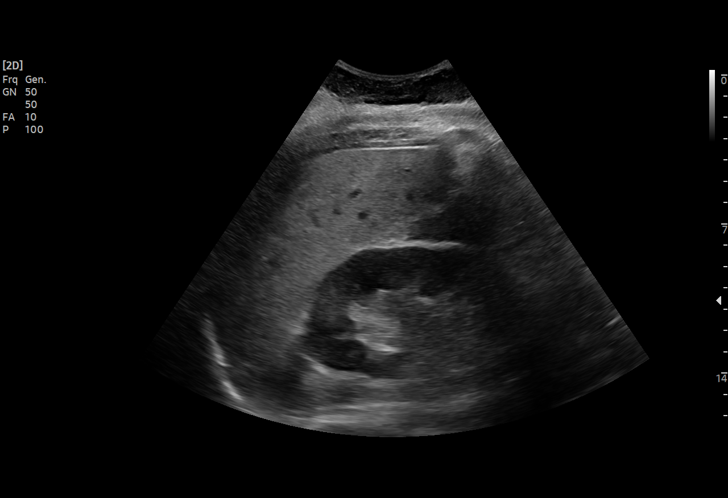
[im 4/39]
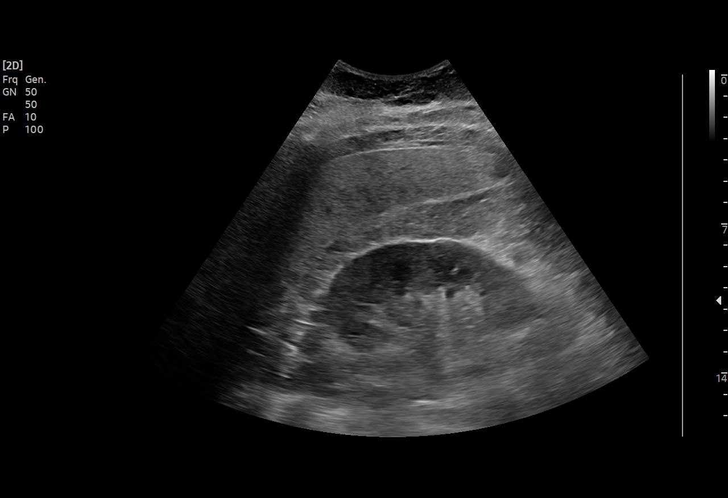
[im 7/39]
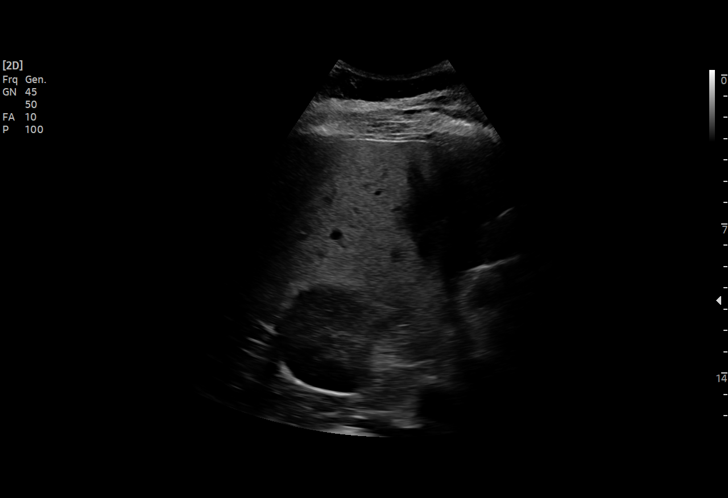
[im 8/39]
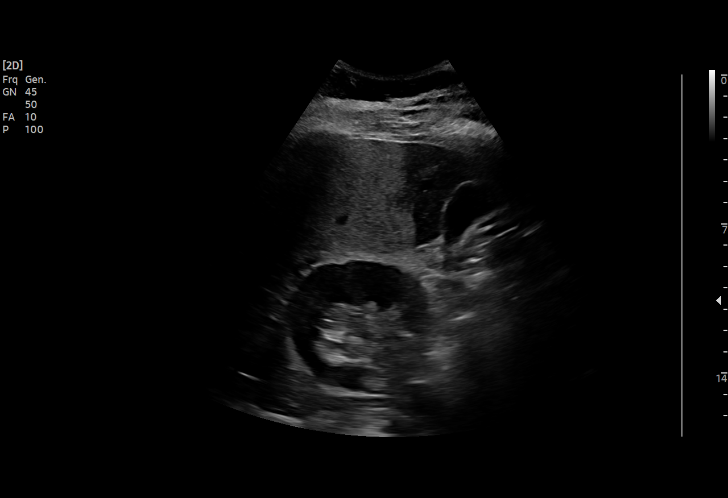
[im 12/39]
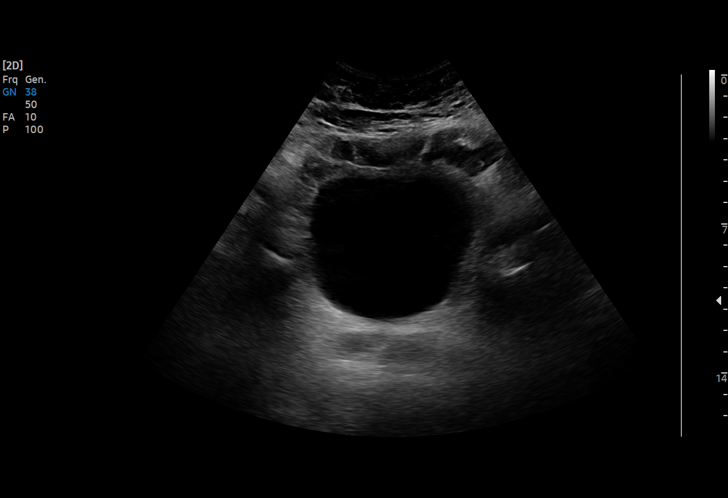
[im 15/39]
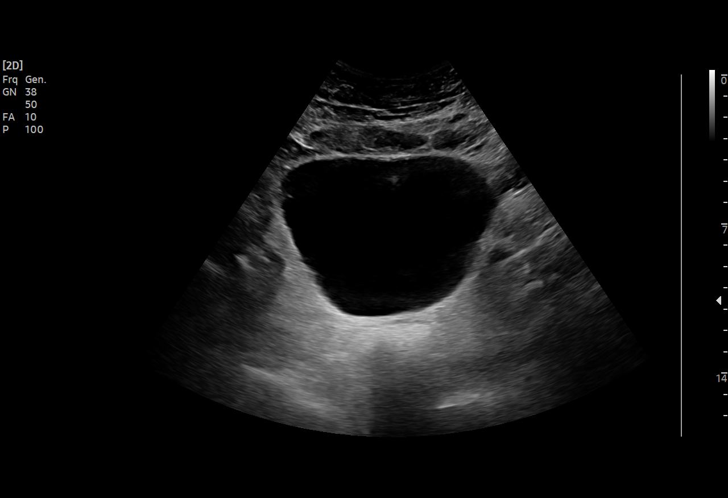
[im 16/39]
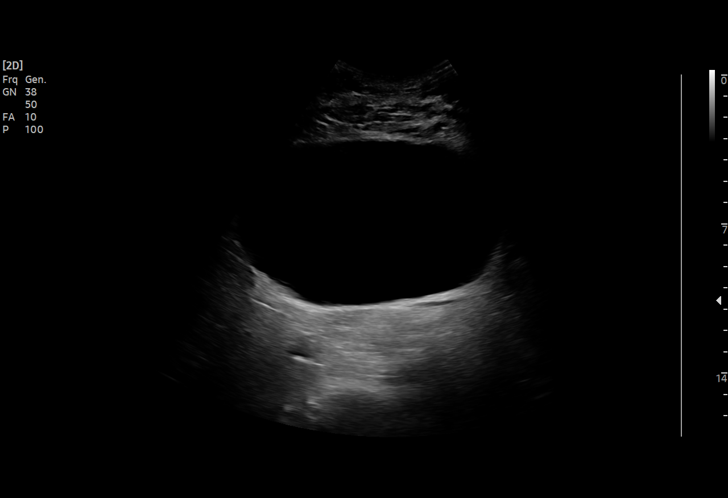
[im 20/39]
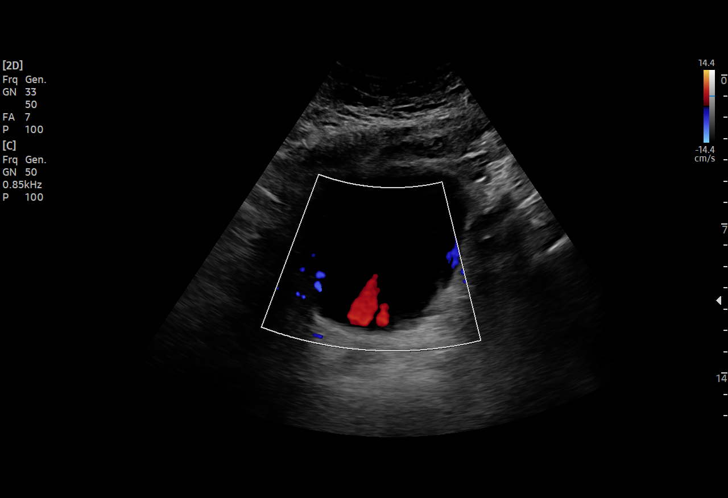
[im 23/39]
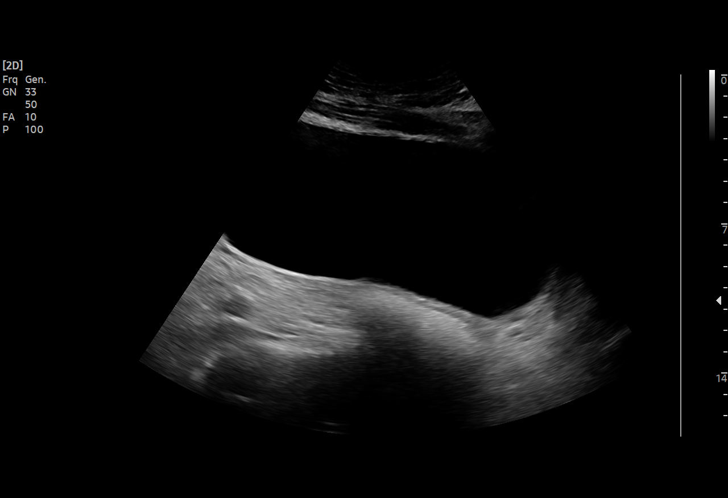
[im 24/39]
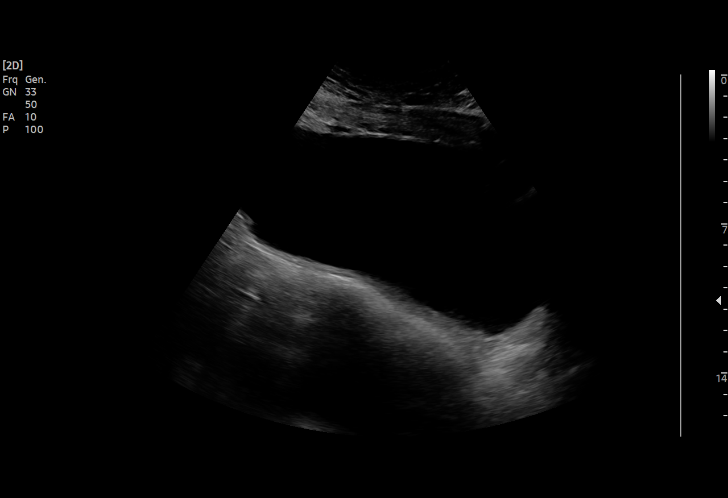
[im 27/39]
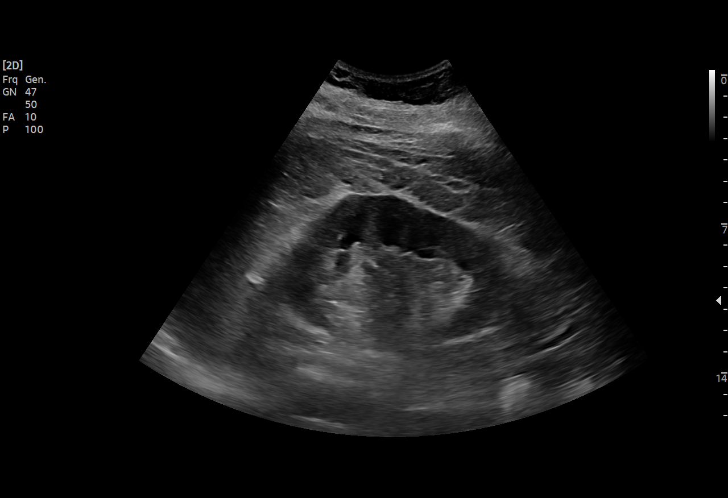
[im 31/39]
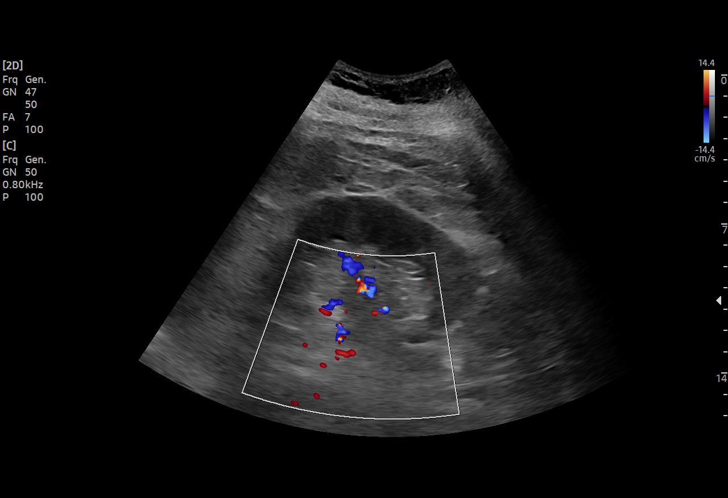
[im 32/39]
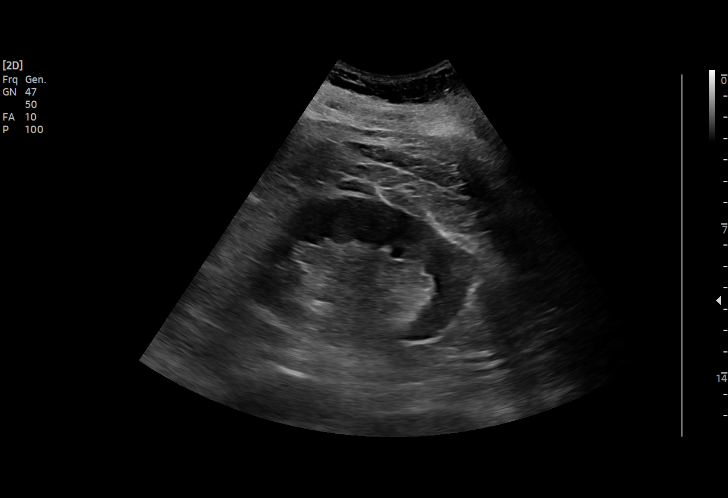
[im 35/39]
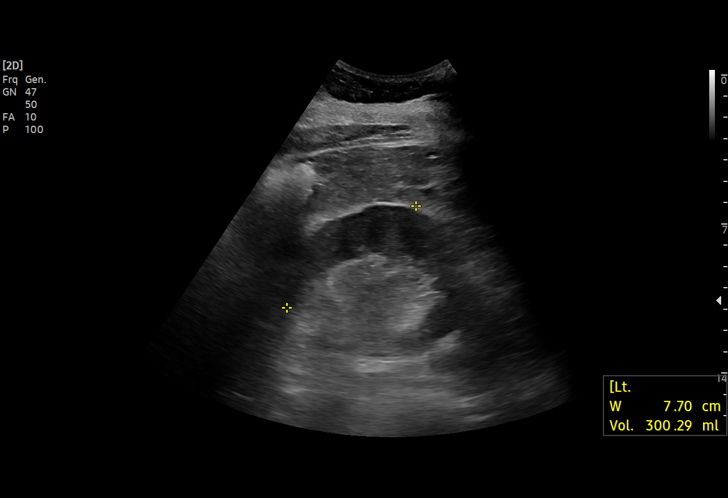
[im 39/39]
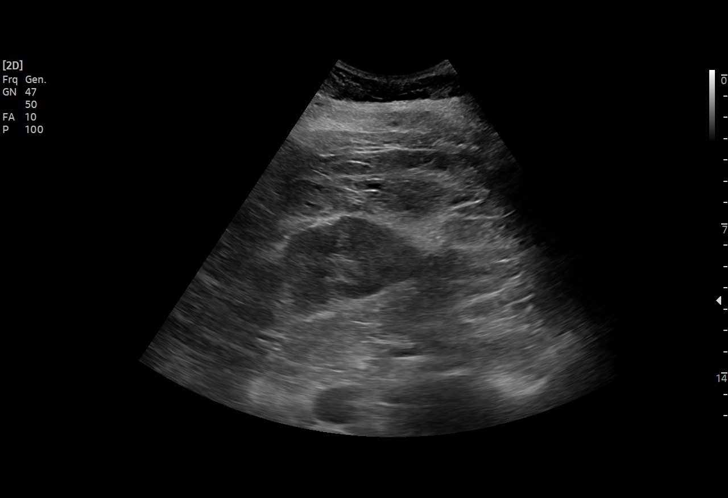

[15 of 25 positions shown; findings below may reference images not displayed]

FINDINGS: Right Kidney:

Renal measurements: 10.4 cm x 6.2 cm x 5.8 cm = volume: 194.5 mL.
Echogenicity within normal limits. No mass or hydronephrosis
visualized.

Left Kidney:

Renal measurements: 10.8 cm x 6.9 cm x 7.7 cm = volume: 300.3 mL.
Echogenicity within normal limits. No mass or hydronephrosis
visualized.

Bladder:

Appears normal for degree of bladder distention.

Other:

None.
IMPRESSION: Normal renal ultrasound.

## 2021-07-19 IMAGING — DX DG ABDOMEN 1V
2 series · 2 of 2 positions shown · non-contrast
Comparison: [DATE], [DATE].

CLINICAL DATA: Decreased appetite.

EXAM:
ABDOMEN - 1 VIEW; PORTABLE CHEST - 1 VIEW

[abdomen kub (1 of 2)]
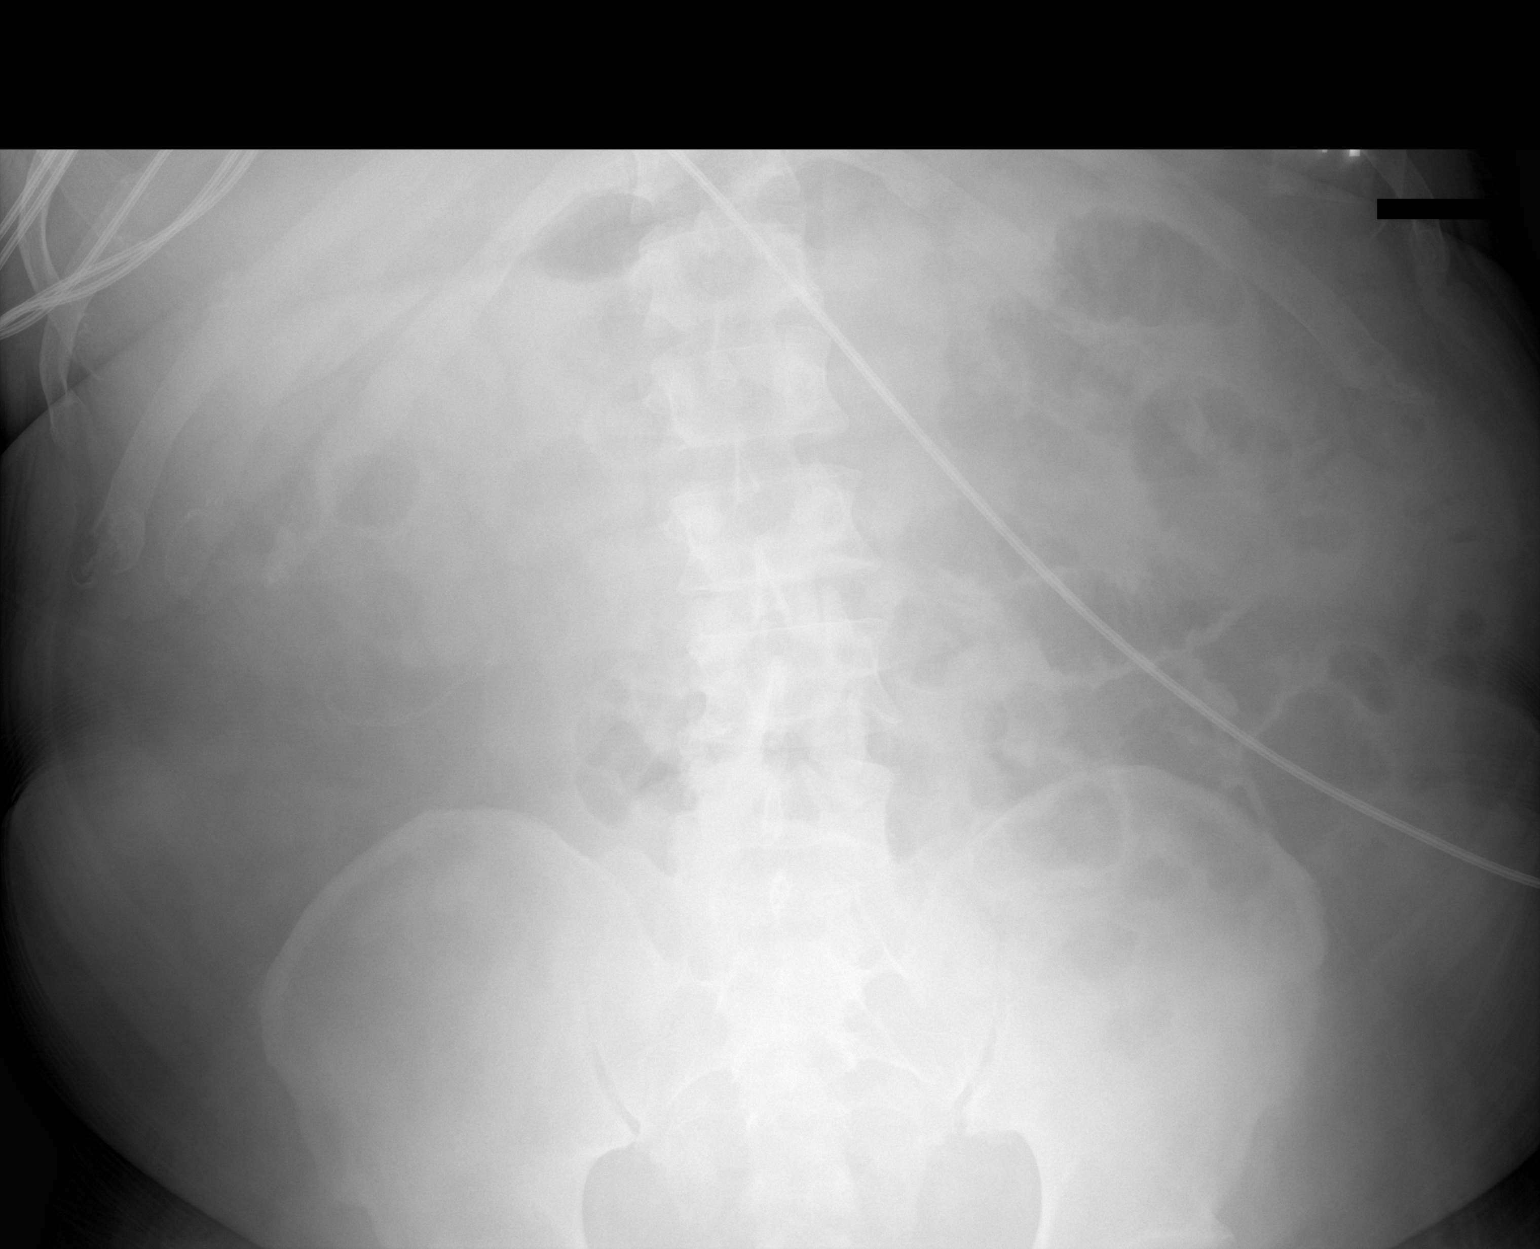

[abdomen kub (2 of 2)]
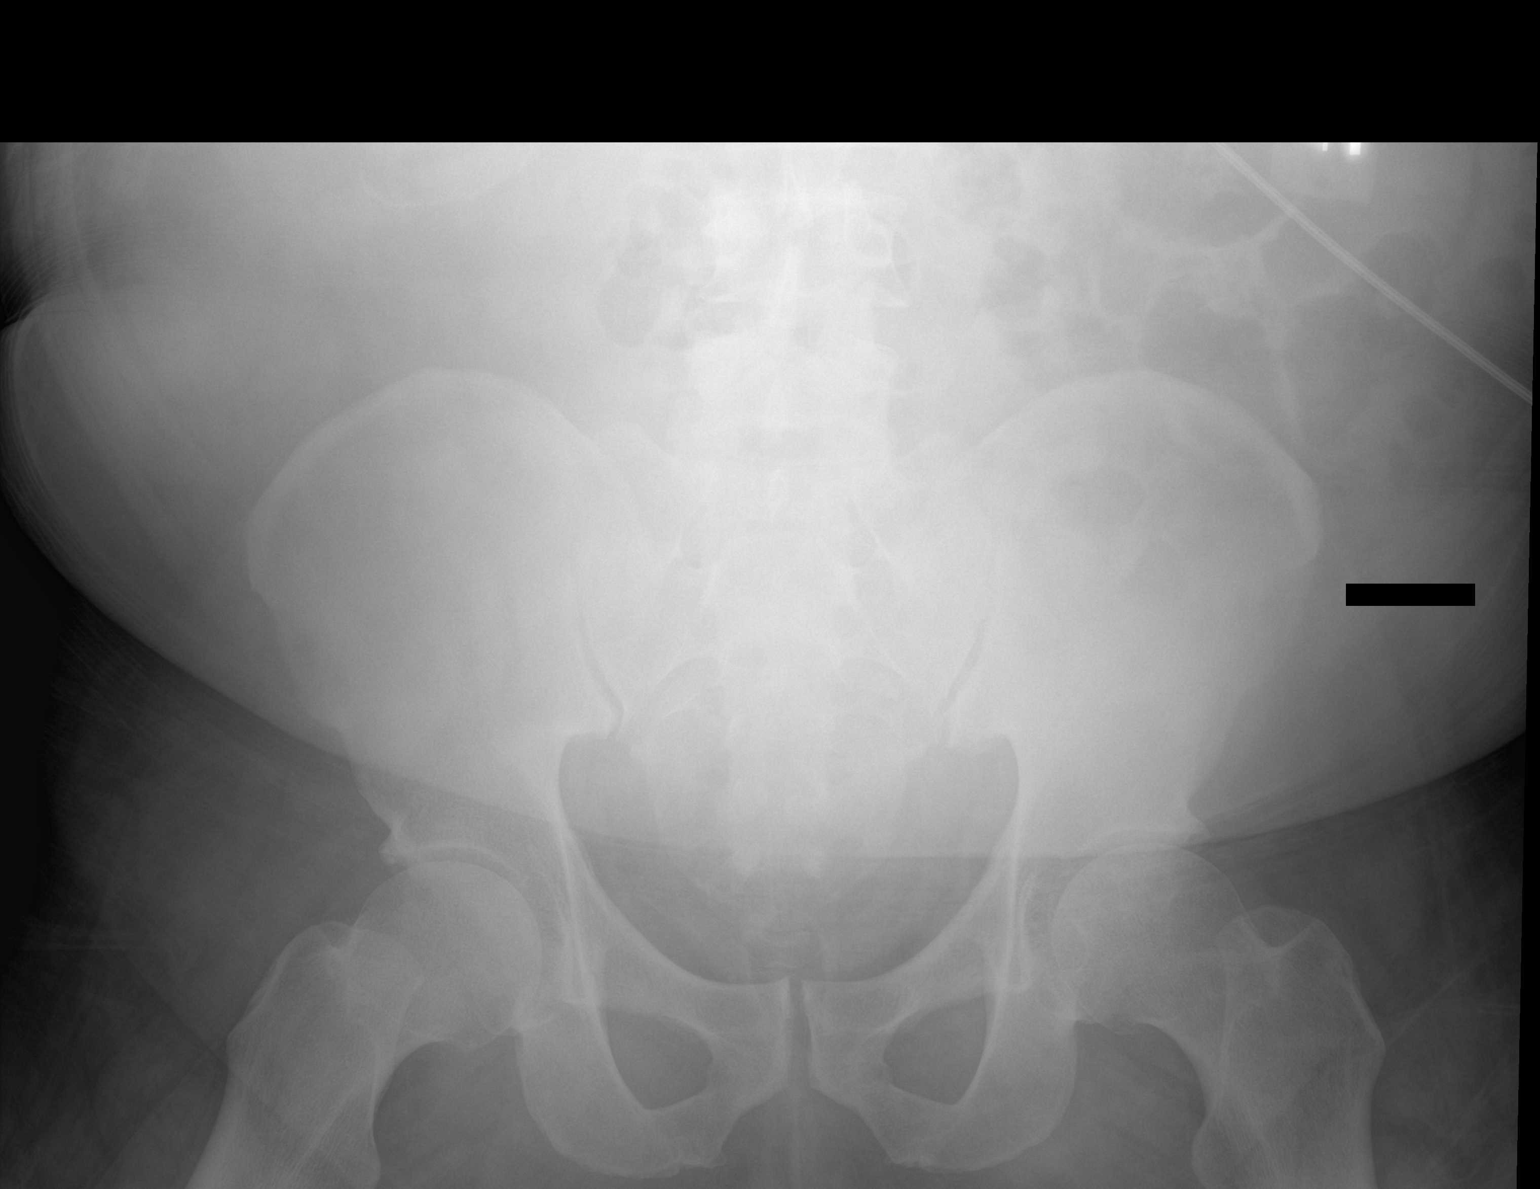

[2 of 2 positions shown; findings below may reference images not displayed]

FINDINGS: The heart and mediastinal contour are within normal limits. Lung
volumes are low. No consolidation, effusion, or pneumothorax. No
acute osseous abnormality.

The bowel gas pattern is normal. Suture material is noted in the
right lower quadrant. No radio-opaque calculi or other significant
radiographic abnormality are seen.
IMPRESSION: Negative.

## 2021-07-19 IMAGING — DX DG CHEST 1V PORT
1 series · 1 of 1 positions shown · non-contrast
Comparison: [DATE], [DATE].

CLINICAL DATA: Decreased appetite.

EXAM:
ABDOMEN - 1 VIEW; PORTABLE CHEST - 1 VIEW

[chest ap]
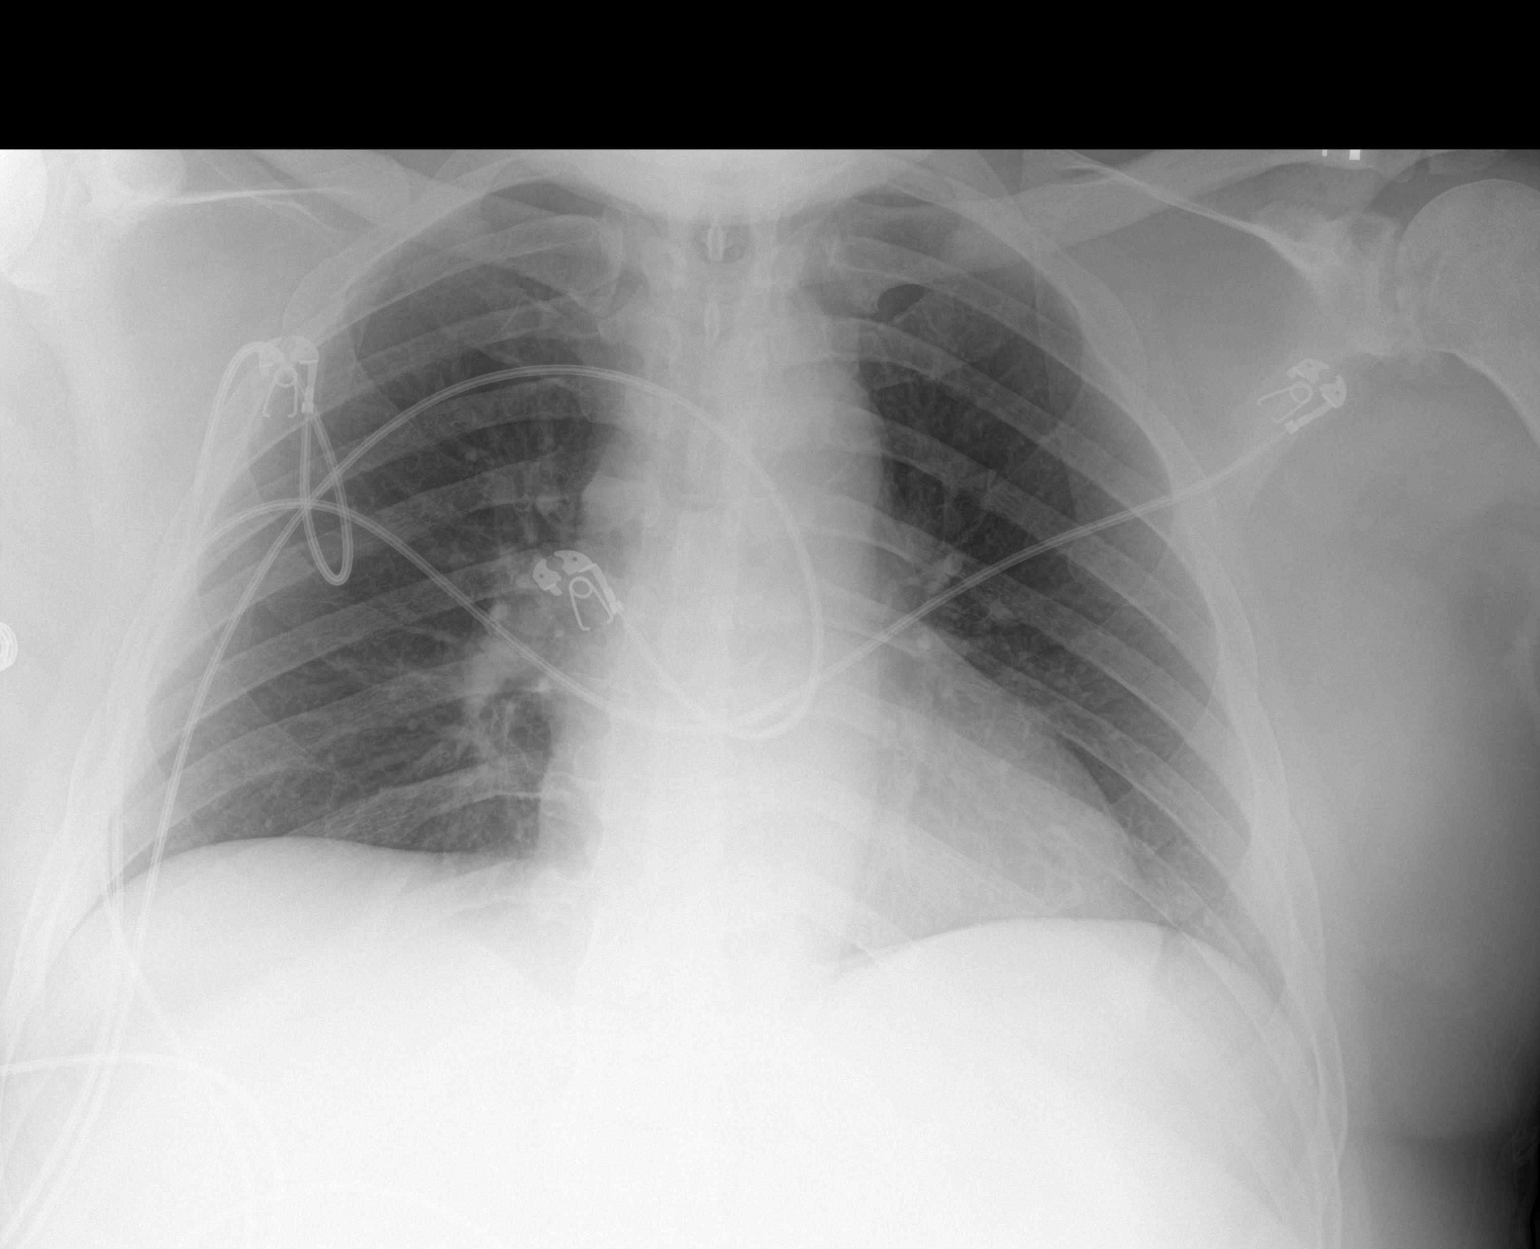

[1 of 1 positions shown; findings below may reference images not displayed]

FINDINGS: The heart and mediastinal contour are within normal limits. Lung
volumes are low. No consolidation, effusion, or pneumothorax. No
acute osseous abnormality.

The bowel gas pattern is normal. Suture material is noted in the
right lower quadrant. No radio-opaque calculi or other significant
radiographic abnormality are seen.
IMPRESSION: Negative.

## 2021-07-19 IMAGING — MR MR MRA HEAD W/O CM
1 series · 17 of 48 positions shown · IV contrast (agent unspecified)
Comparison: Prior CT from earlier the same day as well as recent
MRI from [DATE].

CLINICAL DATA: Initial evaluation for neuro deficit, stroke
suspected.

EXAM:
MRI HEAD WITHOUT CONTRAST
MRA HEAD WITHOUT CONTRAST
MRA NECK WITHOUT AND WITH CONTRAST
TECHNIQUE: Multiplanar, multi-echo pulse sequences of the brain and surrounding
structures were acquired without intravenous contrast. Angiographic
images of the Circle of Willis were acquired using MRA technique
without intravenous contrast. Angiographic images of the neck were
acquired using MRA technique without and with intravenous contrast.
Carotid stenosis measurements (when applicable) are obtained
utilizing NASCET criteria, using the distal internal carotid
diameter as the denominator.
CONTRAST:  See injection documentation.

[Series 8: TOF · axial · 0.6mm · 0.35mm/px · z∈[-43,+55]mm · 17 of 172 slices shown]
[im 1/172]
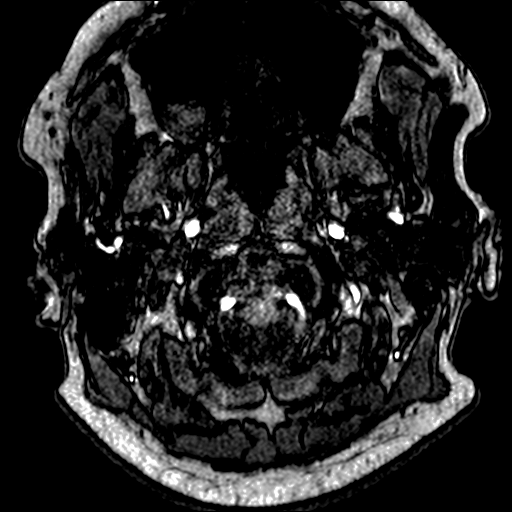
[im 4/172]
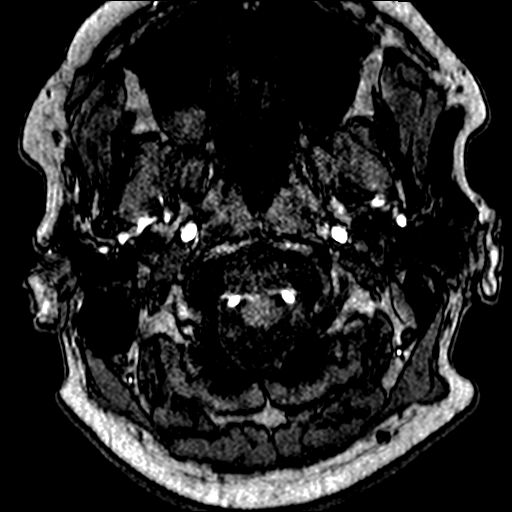
[im 8/172]
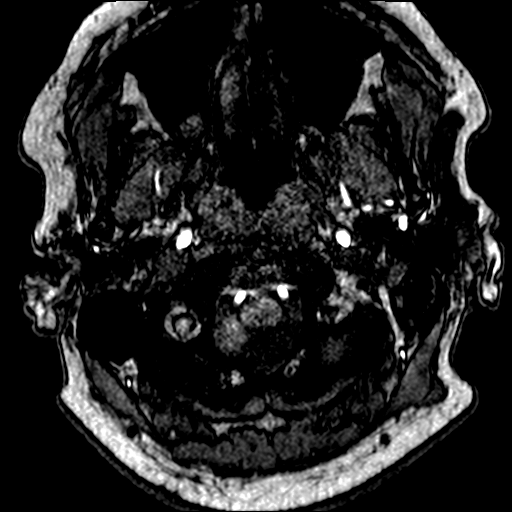
[im 11/172]
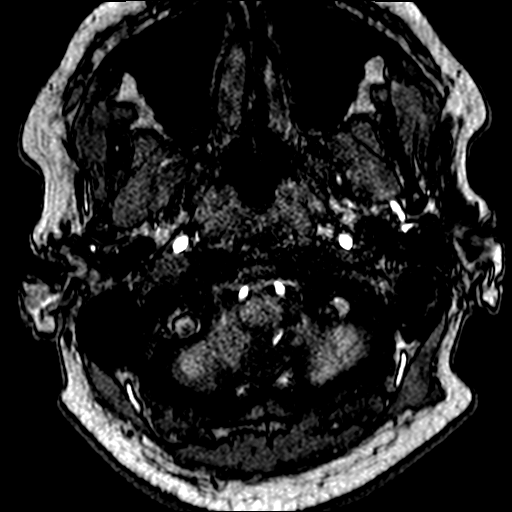
[im 15/172]
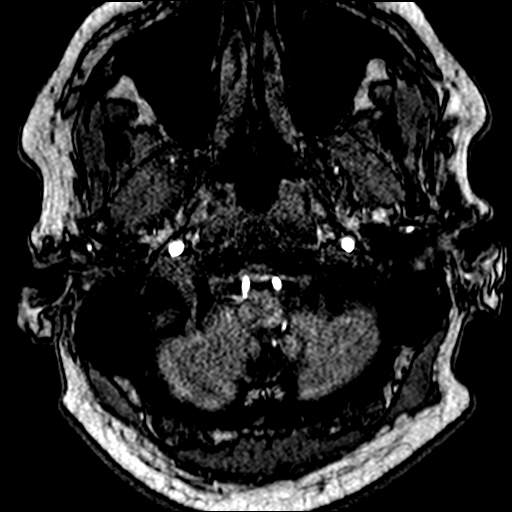
[im 19/172]
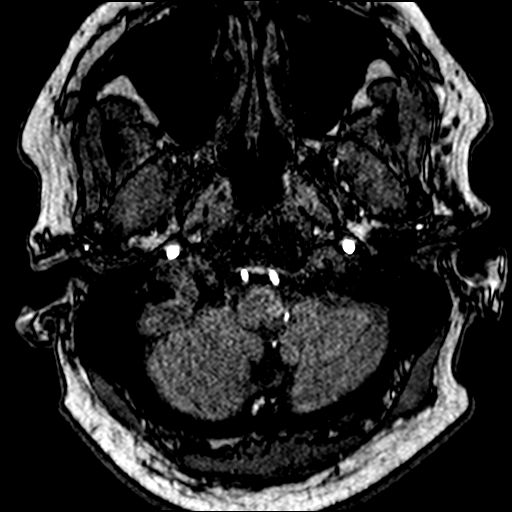
[im 22/172]
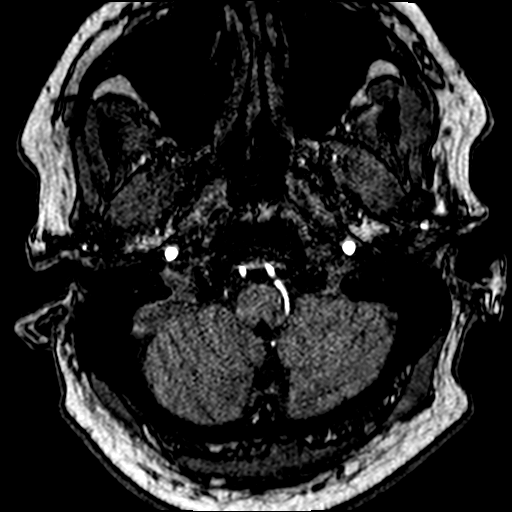
[im 30/172]
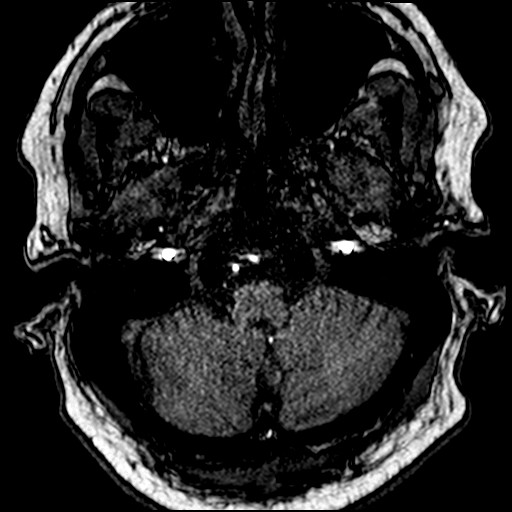
[im 33/172]
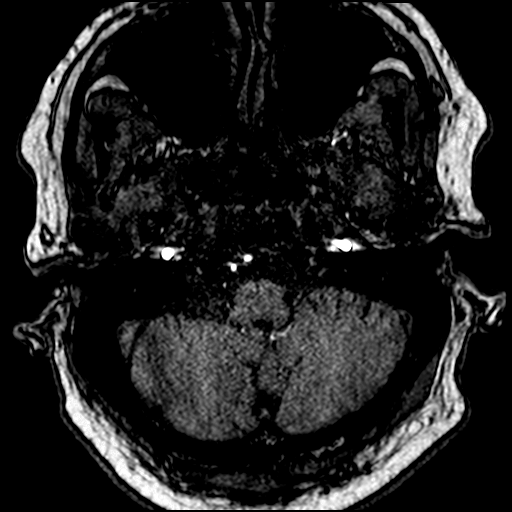
[im 55/172]
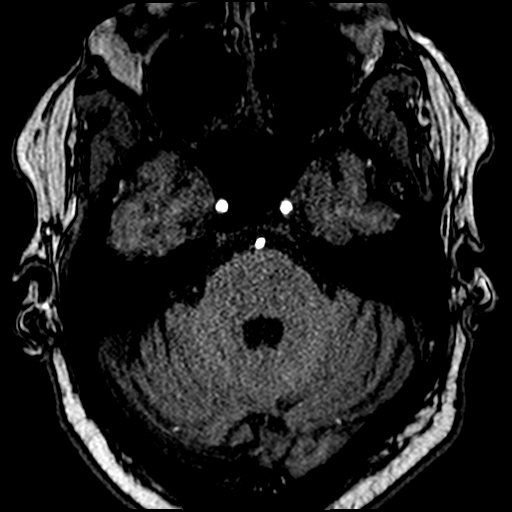
[im 77/172]
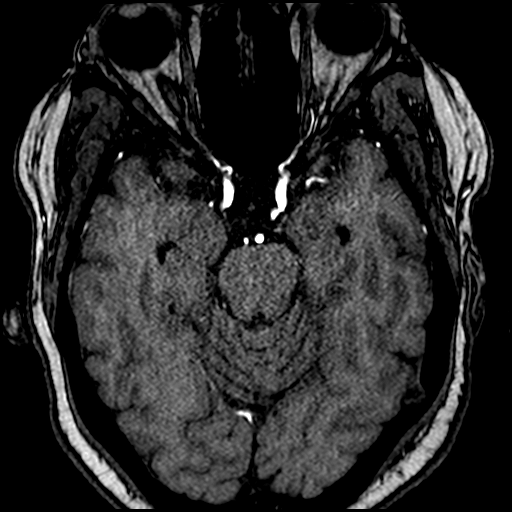
[im 88/172]
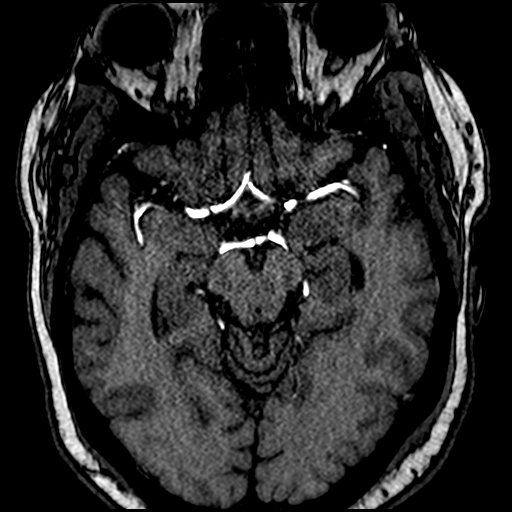
[im 99/172]
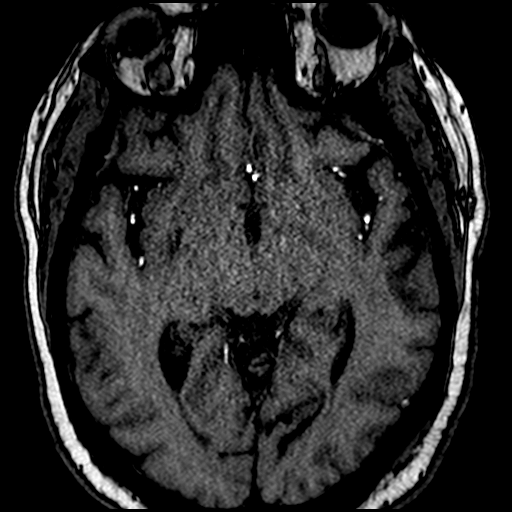
[im 121/172]
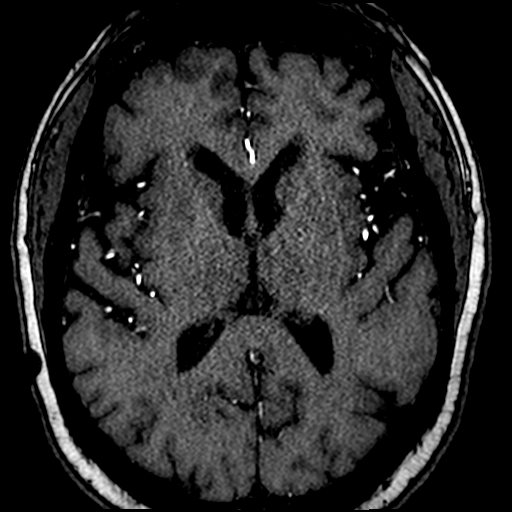
[im 142/172]
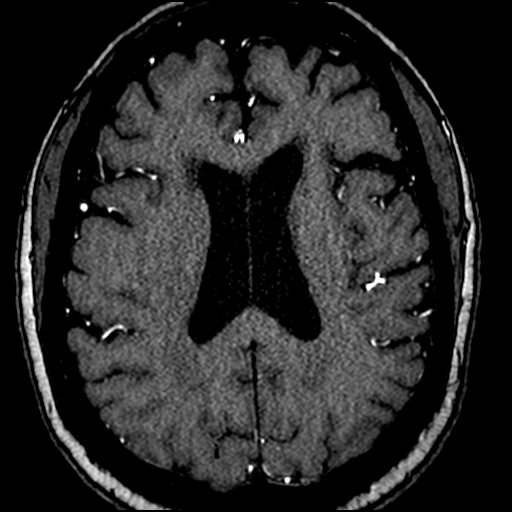
[im 146/172]
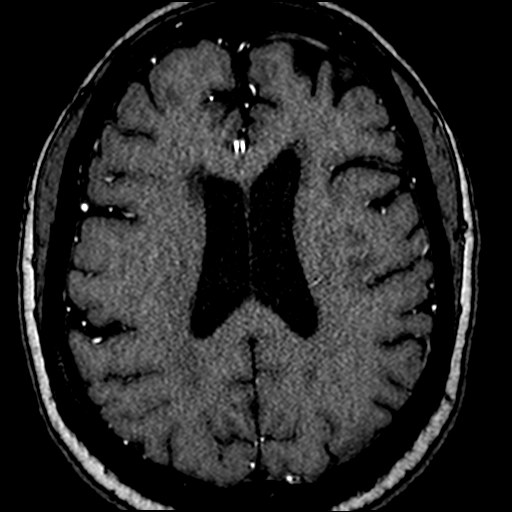
[im 164/172]
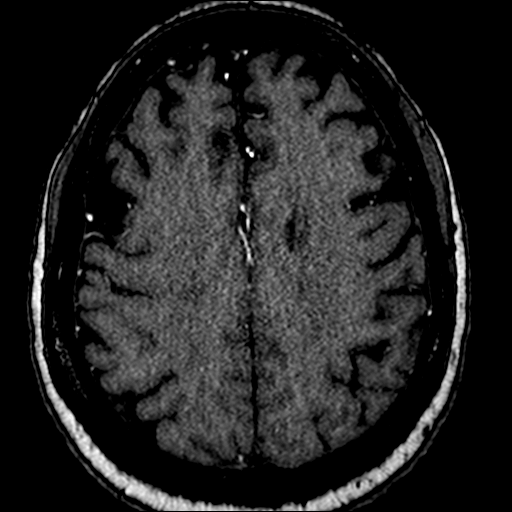

[17 of 48 positions shown; findings below may reference images not displayed]

FINDINGS: MRI HEAD FINDINGS

Brain: Cerebral volume within normal limits. Patchy T2/FLAIR
hyperintensity involving the periventricular deep white matter both
cerebral hemispheres most consistent with chronic small vessel
ischemic disease, moderate in nature. Small remote left pontine
lacunar infarct again noted.

No abnormal foci of restricted diffusion to suggest acute or
subacute ischemia. Gray-white matter differentiation maintained. No
areas of chronic cortical infarction. No acute or chronic
intracranial blood products.

No mass lesion or midline shift. No hydrocephalus or extra-axial
fluid collection. Partially empty sella noted. Midline structures
intact.

Vascular: Major intracranial vascular flow voids are maintained.

Skull and upper cervical spine: Craniocervical junction within
normal limits. Bone marrow signal intensity diffusely decreased on
T1 weighted sequence, nonspecific, but most commonly related to
anemia, smoking or obesity. No focal marrow replacing lesion. No
scalp soft tissue abnormality.

Sinuses/Orbits: Bilateral axial myopia noted. Globes and orbital
soft tissues demonstrate no acute finding. Scattered mucosal
thickening noted about the ethmoidal air cells. Few small maxillary
sinus retention cyst noted. No mastoid effusion.

Other: None.

MRA HEAD FINDINGS

Anterior circulation: Visualized distal cervical segments of the
internal carotid arteries widely patent with antegrade flow.
Petrous, cavernous, and supraclinoid segments patent without
stenosis or other abnormality. A1 segments patent. Normal anterior
communicating complex. Anterior cerebral arteries patent without
stenosis. No M1 stenosis or occlusion. Normal MCA bifurcations.
Distal MCA branches well perfused and symmetric.

Posterior circulation: Vertebral arteries largely code dominant and
patent to the vertebrobasilar junction without stenosis. Both PICA
origins patent and normal. Basilar patent to its distal aspect
without stenosis. Superior cerebellar arteries patent bilaterally.
Both PCAs primarily supplied via the basilar. Prominent left
posterior communicating artery noted. Both PCAs well perfused to
their distal aspects without stenosis.

Anatomic variants: None significant. No aneurysm.

MRA NECK FINDINGS

Aortic arch: Visualized aortic arch normal in caliber. Bovine
branching pattern noted. No stenosis about the origin of the great
vessels.

Right carotid system: Right common and internal carotid arteries
patent without stenosis or evidence for dissection. No significant
atheromatous narrowing about the right carotid bulb.

Left carotid system: Left common and internal carotid arteries
patent without stenosis or evidence for dissection. No significant
atheromatous narrowing about the left carotid bulb.

Vertebral arteries: Both vertebral arteries arise from subclavian
arteries. No significant proximal subclavian artery stenosis.
Vertebral arteries patent without stenosis or evidence for
dissection.

Other: None
IMPRESSION: MRI HEAD:

1. No acute intracranial abnormality.
2. Moderate chronic microvascular ischemic disease, with small
remote left pontine lacunar infarct.
3. Partially empty sella.

MRA HEAD:

Normal intracranial MRA. No large vessel occlusion, hemodynamically
significant stenosis, or other acute vascular abnormality.

MRA NECK:

Normal MRA of the neck.

## 2021-07-19 MED ORDER — LACTATED RINGERS IV SOLN: INTRAVENOUS | Status: AC

## 2021-07-19 MED ORDER — INSULIN ASPART 100 UNIT/ML IJ SOLN
0.0000 [IU] | INTRAMUSCULAR | Status: DC
  Administered 2021-07-20 – 2021-07-21 (×7): 1 [IU] via SUBCUTANEOUS
  Filled 2021-07-19: qty 0.09

## 2021-07-19 MED ORDER — SODIUM CHLORIDE 0.9 % IV SOLN
75.0000 mL/h | INTRAVENOUS | Status: DC
  Administered 2021-07-20: 75 mL/h via INTRAVENOUS

## 2021-07-19 MED ORDER — ACETAMINOPHEN 325 MG PO TABS: 650.0000 mg | ORAL_TABLET | Freq: Four times a day (QID) | ORAL | Status: DC | PRN

## 2021-07-19 MED ORDER — ACETAMINOPHEN 650 MG RE SUPP: 650.0000 mg | Freq: Four times a day (QID) | RECTAL | Status: DC | PRN

## 2021-07-19 MED ORDER — HYDROCODONE-ACETAMINOPHEN 5-325 MG PO TABS: 1.0000 | ORAL_TABLET | ORAL | Status: DC | PRN

## 2021-07-19 MED ORDER — ASPIRIN EC 81 MG PO TBEC
81.0000 mg | DELAYED_RELEASE_TABLET | Freq: Every day | ORAL | Status: DC
  Administered 2021-07-20 – 2021-07-21 (×2): 81 mg via ORAL
  Filled 2021-07-19 (×2): qty 1

## 2021-07-19 MED ORDER — ATORVASTATIN CALCIUM 20 MG PO TABS
20.0000 mg | ORAL_TABLET | Freq: Every day | ORAL | Status: DC
  Administered 2021-07-20 – 2021-07-21 (×2): 20 mg via ORAL
  Filled 2021-07-19 (×2): qty 1

## 2021-07-19 MED ORDER — LACTATED RINGERS IV BOLUS
1000.0000 mL | Freq: Once | INTRAVENOUS | Status: AC
  Administered 2021-07-19: 1000 mL via INTRAVENOUS

## 2021-07-19 MED ORDER — POTASSIUM CHLORIDE CRYS ER 20 MEQ PO TBCR
20.0000 meq | EXTENDED_RELEASE_TABLET | Freq: Once | ORAL | Status: AC
  Administered 2021-07-19: 20 meq via ORAL
  Filled 2021-07-19: qty 1

## 2021-07-19 MED ORDER — GADOBUTROL 1 MMOL/ML IV SOLN
10.0000 mL | Freq: Once | INTRAVENOUS | Status: AC | PRN
  Administered 2021-07-19: 10 mL via INTRAVENOUS

## 2021-07-19 NOTE — Assessment & Plan Note (Signed)
- 

## 2021-07-19 NOTE — Assessment & Plan Note (Signed)
-    evidence of acute renal failure due to presence of following: Cr increased >0.3 from baseline  likely secondary to dehydration       check FeNA       Rehydrate with IV fluids  History does not suggest urinary retention or obstruction       Given severity would would obtain renal US to evaluate for other causes  If persists despite fluid resuscitation will need renal consult.

## 2021-07-19 NOTE — Assessment & Plan Note (Signed)
Secondary to p.o. intake decrease will rehydrate Work-up causes for poor appetite.  Patient is on Ozempic which can cause decreased p.o. intake and appetite decreased.

## 2021-07-19 NOTE — Subjective & Objective (Addendum)
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  Patient said he had slurred for more than 10 years. He first noticed the slurred speech starting in 2010.  He reports he was in [place] taking pictures and felt a loud swishing sound and since then has been having trouble with his speech. Family confirmed that reported speech has been slurred most of the time but 3 weeks ago he has been worse to the point that now he is mumbling and is very difficult to understand him.  He denies any trouble swallowing, no chocking spells just no appetite.   he was prescribed a medication at the [place] for sleep but currently is not taking it.  He also reported generalized weakness that has been getting worse. No he is having trouble walking as well due to generalized fatigue and feels that he is trembling

## 2021-07-19 NOTE — ED Notes (Signed)
No urine output during shift.  Patient he only urinates usually twice daily.

## 2021-07-19 NOTE — Assessment & Plan Note (Addendum)
Has been off Reglan for years

## 2021-07-19 NOTE — Assessment & Plan Note (Signed)
Will need outpatient referral to dietitian clinic

## 2021-07-19 NOTE — ED Notes (Signed)
Pt in mri 

## 2021-07-19 NOTE — H&P (Addendum)
Micheal Gomez AJG:[id number] DOB: [birth year] DOA: 07/19/2021     PCP: [facility]    NEurology Dr. April Manson    Patient arrived to ER on 07/19/21 at 1142 Referred by Attending Toy Baker, MD   Patient coming from: home Lives alone,        Chief Complaint:   Chief Complaint  Patient presents with   Dizziness   Weakness    HPI: Micheal Gomez is a 66 y.o. 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  Patient said he had slurred for more than 10 years. He first noticed the slurred speech starting in 2010.  He reports he was in [place] taking pictures and felt a loud swishing sound and since then has been having trouble with his speech. Family confirmed that reported speech has been slurred most of the time but 3 weeks ago he has been worse to the point that now he is mumbling and is very difficult to understand him.  He denies any trouble swallowing, no chocking spells just no appetite.   he was prescribed a medication at the [place] for sleep but currently is not taking it.  He also reported generalized weakness that has been getting worse. No he is having trouble walking as well due to generalized fatigue and feels that he is trembling   Sometimes uses CPAP Stopped smoking 15 y ago does not drink Reports he feels constipated uses Pepto-bismol stopped using Reglan for years No black stool Last BM was 2 days Reports urine more darker no frothy No trouble emptying  Has  NOt been vaccinated against  COVID  and boosted had     Initial COVID TEST  NEGATIVE   Lab Results  Component Value Date   SARSCOV2NAA NEGATIVE 07/19/2021   SARSCOV2NAA POSITIVE (A) 11/29/2020     Regarding pertinent Chronic problems:     Hyperlipidemia -  on statins Lipitor     HTN on  Norvasc, HCTZ      DM 2 -  Lab Results  Component Value Date   HGBA1C 7.8 (H) 11/21/2020   on insulin, Novolog 30 units BID,  metformin, Ozempic     obesity-   BMI Readings from Last 1 Encounters:  07/19/21 37.24 kg/m        OSA -on nocturnal  CPAP     Hx of CVA -  with residual deficits on Aspirin 81 mg, Lipitor     While in ER:  Noted to have AKI Cr up to 3.19 MRI neg for new CVA   Ordered  CT HEAD   NON acute  KUB - non acute  CXR -  NON acute    MRI/A head and neck - Normal intracranial MRA. No large vessel occlusion, hemodynamically significant stenosis, or other acute vascular abnormality 1. No acute intracranial abnormality. 2. Moderate chronic microvascular ischemic disease, with small remote left pontine lacunar infarct. 3. Partially empty sella Following Medications were ordered in ER: Medications  lactated ringers infusion ( Intravenous New Bag/Given 07/19/21 2020)  insulin aspart (novoLOG) injection 0-9 Units (has no administration in time range)  lactated ringers bolus 1,000 mL (1,000 mLs Intravenous Bolus 07/19/21 1758)  gadobutrol (GADAVIST) 1 MMOL/ML injection 10 mL (10 mLs Intravenous Contrast Given 07/19/21 2012)    ______________    ED Triage Vitals  Enc Vitals Group     BP 07/19/21 1640 121/63     Pulse Rate 07/19/21 1645 96     Resp 07/19/21 1645 18     Temp 07/19/21 1645 (!) 97.4 F (36.3 C)     Temp Source 07/19/21 1645 Oral     SpO2 07/19/21 1645 99 %     Weight 07/19/21 1224 252 lb 3.3 oz (114.4 kg)     Height --      Head Circumference --      Peak Flow --      Pain Score 07/19/21 1224 0     Pain Loc --      Pain Edu? --      Excl. in [place]? --   TMAX(24)@      _________________________________________ Significant initial  Findings: Abnormal Labs Reviewed  COMPREHENSIVE METABOLIC PANEL - Abnormal; Notable for the following components:      Result Value   Potassium 3.4 (*)    Glucose, Bld 101 (*)    BUN 57 (*)    Creatinine, Ser 3.19 (*)    Total Protein 8.2 (*)    GFR, Estimated 21 (*)    All other components within normal limits     _________________________ Troponin 7 ECG: Ordered Personally reviewed by me showing: HR : 89 Rhythm:  NSR,    nonspecific changes,   QTC 441     The recent clinical data is shown below. Vitals:   07/19/21 1715 07/19/21 1730 07/19/21 2005 07/19/21 2006  BP: 132/74 126/76 112/64 112/64  Pulse: 95 99  86  Resp: 18 (!) 31  (!) 31  Temp:      TempSrc:      SpO2: 98% 97% 98% 100%  Weight:         WBC     Component Value Date/Time   WBC 6.8 07/19/2021 1649   LYMPHSABS 2.4 07/11/2021 1235   MONOABS 0.4 07/11/2021 1235   EOSABS 0.1 07/11/2021 1235   BASOSABS 0.0 07/11/2021 1235       UA  ordered     Results for orders placed or performed during the hospital encounter of 07/19/21  Resp Panel by RT-PCR (Flu A&B, Covid) Nasopharyngeal Swab     Status: None   Collection Time: 07/19/21  8:24 PM   Specimen: Nasopharyngeal Swab; Nasopharyngeal(NP) swabs in vial transport medium  Result Value Ref Range Status   SARS Coronavirus 2 by RT PCR NEGATIVE NEGATIVE Final         Influenza A by PCR NEGATIVE NEGATIVE Final   Influenza B by PCR NEGATIVE NEGATIVE Final           _______________________________________________ Hospitalist was called for admission for AKI  The following Work up has been ordered so far:  Orders Placed This Encounter  Procedures   Resp Panel by RT-PCR (Flu A&B, Covid) Nasopharyngeal Swab   CT HEAD WO CONTRAST (5MM)   MR BRAIN WO CONTRAST   MR ANGIO HEAD WO CONTRAST   MR Angiogram Neck W or Wo Contrast   DG Abdomen 1 View   DG Chest Portable 1 View   CBC    Comprehensive metabolic panel   TSH   Magnesium  Urinalysis, Routine w reflex microscopic   CK   Differential   Hepatic function panel   Phosphorus   Osmolality, urine   Osmolality   Sodium, urine, random   Creatinine, urine, random   Hemoglobin A1c   Cardiac monitoring   STAT CBG when hypoglycemia is suspected. If treated, recheck every 15 minutes after each treatment until CBG >/= 70 mg/dl   Refer to Hypoglycemia Protocol Sidebar Report for treatment of CBG < 70 mg/dl   No basal insulin at this time   Consult to hospitalist   EKG 12-Lead   Place in observation (patient's expected length of stay will be less than 2 midnights)     OTHER Significant initial  Findings:  labs showing:    Recent Labs  Lab 07/19/21 1649  NA 136  K 3.4*  CO2 22  GLUCOSE 101*  BUN 57*  CREATININE 3.19*  CALCIUM 9.1  MG 2.4    Cr    Up from baseline see below Lab Results  Component Value Date   CREATININE 3.19 (H) 07/19/2021   CREATININE 1.45 (H) 07/11/2021   CREATININE 1.18 06/05/2021    Recent Labs  Lab 07/19/21 1649  AST 17  ALT 15  ALKPHOS 113  BILITOT 0.7  PROT 8.2*  ALBUMIN 4.1   Lab Results  Component Value Date   CALCIUM 9.1 07/19/2021   PHOS 4.0 01/17/2021          Plt: Lab Results  Component Value Date   PLT 240 07/19/2021     COVID-19 Labs  No results for input(s): DDIMER, FERRITIN, LDH, CRP in the last 72 hours.  Lab Results  Component Value Date   SARSCOV2NAA POSITIVE (A) 11/29/2020        Recent Labs  Lab 07/19/21 1649  WBC 6.8  HGB 14.1  HCT 43.8  MCV 83.9  PLT 240    HG/HCT stable,     Component Value Date/Time   HGB 14.1 07/19/2021 1649   HCT 43.8 07/19/2021 1649   MCV 83.9 07/19/2021 1649      Cardiac Panel (last 3 results) No results for input(s): CKTOTAL, CKMB, TROPONINI, RELINDX in the last 72 hours.    DM  labs:  HbA1C: Recent Labs    11/21/20 0845  HGBA1C 7.8*       CBG (last 3)  No results for input(s):  GLUCAP in the last 72 hours.        Cultures:    Component Value Date/Time   SDES URINE, CATHETERIZED 08/05/2015 1205   SPECREQUEST NONE 08/05/2015 1205   CULT  08/05/2015 1205    NO GROWTH 1 DAY Performed at [facility] 08/06/2015 FINAL 08/05/2015 1205     Radiological Exams on Admission: CT HEAD WO CONTRAST (5MM)  Result Date: 07/19/2021 CLINICAL DATA:  Acute neuro deficit. EXAM: CT HEAD WITHOUT CONTRAST TECHNIQUE: Contiguous axial images were obtained from the base of the skull through the vertex without intravenous contrast. RADIATION DOSE REDUCTION: This exam was performed according to the departmental dose-optimization program which includes automated exposure control, adjustment of the mA and/or kV according to patient size and/or use of iterative reconstruction technique. COMPARISON:  CT head 07/11/2021.  MRI 07/11/2021 FINDINGS: Brain: No evidence of acute infarction, hemorrhage, hydrocephalus, extra-axial collection or mass lesion/mass effect. Mild white matter hypodensity bilaterally similar to prior studies. Vascular: Negative for hyperdense vessel Skull: Negative Sinuses/Orbits: Negative Other: None IMPRESSION: No acute abnormality. Chronic white matter changes consistent with microvascular  ischemia. Electronically Signed   By: Franchot Gallo M.D.   On: 07/19/2021 17:12   MR ANGIO HEAD WO CONTRAST  Result Date: 07/19/2021 CLINICAL DATA:  Initial evaluation for neuro deficit, stroke suspected. EXAM: MRI HEAD WITHOUT CONTRAST MRA HEAD WITHOUT CONTRAST MRA NECK WITHOUT AND WITH CONTRAST TECHNIQUE: Multiplanar, multi-echo pulse sequences of the brain and surrounding structures were acquired without intravenous contrast. Angiographic images of the Circle of Willis were acquired using MRA technique without intravenous contrast. Angiographic images of the neck were acquired using MRA technique without and with intravenous contrast. Carotid stenosis measurements  (when applicable) are obtained utilizing NASCET criteria, using the distal internal carotid diameter as the denominator. CONTRAST:  See injection documentation. COMPARISON:  Prior CT from earlier the same day as well as recent MRI from 07/11/2021. FINDINGS: MRI HEAD FINDINGS Brain: Cerebral volume within normal limits. Patchy T2/FLAIR hyperintensity involving the periventricular deep white matter both cerebral hemispheres most consistent with chronic small vessel ischemic disease, moderate in nature. Small remote left pontine lacunar infarct again noted. No abnormal foci of restricted diffusion to suggest acute or subacute ischemia. Gray-white matter differentiation maintained. No areas of chronic cortical infarction. No acute or chronic intracranial blood products. No mass lesion or midline shift. No hydrocephalus or extra-axial fluid collection. Partially empty sella noted. Midline structures intact. Vascular: Major intracranial vascular flow voids are maintained. Skull and upper cervical spine: Craniocervical junction within normal limits. Bone marrow signal intensity diffusely decreased on T1 weighted sequence, nonspecific, but most commonly related to anemia, smoking or obesity. No focal marrow replacing lesion. No scalp soft tissue abnormality. Sinuses/Orbits: Bilateral axial myopia noted. Globes and orbital soft tissues demonstrate no acute finding. Scattered mucosal thickening noted about the ethmoidal air cells. Few small maxillary sinus retention cyst noted. No mastoid effusion. Other: None. MRA HEAD FINDINGS Anterior circulation: Visualized distal cervical segments of the internal carotid arteries widely patent with antegrade flow. Petrous, cavernous, and supraclinoid segments patent without stenosis or other abnormality. A1 segments patent. Normal anterior communicating complex. Anterior cerebral arteries patent without stenosis. No M1 stenosis or occlusion. Normal MCA bifurcations. Distal MCA branches  well perfused and symmetric. Posterior circulation: Vertebral arteries largely code dominant and patent to the vertebrobasilar junction without stenosis. Both PICA origins patent and normal. Basilar patent to its distal aspect without stenosis. Superior cerebellar arteries patent bilaterally. Both PCAs primarily supplied via the basilar. Prominent left posterior communicating artery noted. Both PCAs well perfused to their distal aspects without stenosis. Anatomic variants: None significant. No aneurysm. MRA NECK FINDINGS Aortic arch: Visualized aortic arch normal in caliber. Bovine branching pattern noted. No stenosis about the origin of the great vessels. Right carotid system: Right common and internal carotid arteries patent without stenosis or evidence for dissection. No significant atheromatous narrowing about the right carotid bulb. Left carotid system: Left common and internal carotid arteries patent without stenosis or evidence for dissection. No significant atheromatous narrowing about the left carotid bulb. Vertebral arteries: Both vertebral arteries arise from subclavian arteries. No significant proximal subclavian artery stenosis. Vertebral arteries patent without stenosis or evidence for dissection. Other: None IMPRESSION: MRI HEAD: 1. No acute intracranial abnormality. 2. Moderate chronic microvascular ischemic disease, with small remote left pontine lacunar infarct. 3. Partially empty sella. MRA HEAD: Normal intracranial MRA. No large vessel occlusion, hemodynamically significant stenosis, or other acute vascular abnormality. MRA NECK: Normal MRA of the neck. Electronically Signed   By: Jeannine Boga M.D.   On: 07/19/2021 20:01   MR Angiogram Neck  W or Wo Contrast  Result Date: 07/19/2021 CLINICAL DATA:  Initial evaluation for neuro deficit, stroke suspected. EXAM: MRI HEAD WITHOUT CONTRAST MRA HEAD WITHOUT CONTRAST MRA NECK WITHOUT AND WITH CONTRAST TECHNIQUE: Multiplanar, multi-echo pulse  sequences of the brain and surrounding structures were acquired without intravenous contrast. Angiographic images of the Circle of Willis were acquired using MRA technique without intravenous contrast. Angiographic images of the neck were acquired using MRA technique without and with intravenous contrast. Carotid stenosis measurements (when applicable) are obtained utilizing NASCET criteria, using the distal internal carotid diameter as the denominator. CONTRAST:  See injection documentation. COMPARISON:  Prior CT from earlier the same day as well as recent MRI from 07/11/2021. FINDINGS: MRI HEAD FINDINGS Brain: Cerebral volume within normal limits. Patchy T2/FLAIR hyperintensity involving the periventricular deep white matter both cerebral hemispheres most consistent with chronic small vessel ischemic disease, moderate in nature. Small remote left pontine lacunar infarct again noted. No abnormal foci of restricted diffusion to suggest acute or subacute ischemia. Gray-white matter differentiation maintained. No areas of chronic cortical infarction. No acute or chronic intracranial blood products. No mass lesion or midline shift. No hydrocephalus or extra-axial fluid collection. Partially empty sella noted. Midline structures intact. Vascular: Major intracranial vascular flow voids are maintained. Skull and upper cervical spine: Craniocervical junction within normal limits. Bone marrow signal intensity diffusely decreased on T1 weighted sequence, nonspecific, but most commonly related to anemia, smoking or obesity. No focal marrow replacing lesion. No scalp soft tissue abnormality. Sinuses/Orbits: Bilateral axial myopia noted. Globes and orbital soft tissues demonstrate no acute finding. Scattered mucosal thickening noted about the ethmoidal air cells. Few small maxillary sinus retention cyst noted. No mastoid effusion. Other: None. MRA HEAD FINDINGS Anterior circulation: Visualized distal cervical segments of the  internal carotid arteries widely patent with antegrade flow. Petrous, cavernous, and supraclinoid segments patent without stenosis or other abnormality. A1 segments patent. Normal anterior communicating complex. Anterior cerebral arteries patent without stenosis. No M1 stenosis or occlusion. Normal MCA bifurcations. Distal MCA branches well perfused and symmetric. Posterior circulation: Vertebral arteries largely code dominant and patent to the vertebrobasilar junction without stenosis. Both PICA origins patent and normal. Basilar patent to its distal aspect without stenosis. Superior cerebellar arteries patent bilaterally. Both PCAs primarily supplied via the basilar. Prominent left posterior communicating artery noted. Both PCAs well perfused to their distal aspects without stenosis. Anatomic variants: None significant. No aneurysm. MRA NECK FINDINGS Aortic arch: Visualized aortic arch normal in caliber. Bovine branching pattern noted. No stenosis about the origin of the great vessels. Right carotid system: Right common and internal carotid arteries patent without stenosis or evidence for dissection. No significant atheromatous narrowing about the right carotid bulb. Left carotid system: Left common and internal carotid arteries patent without stenosis or evidence for dissection. No significant atheromatous narrowing about the left carotid bulb. Vertebral arteries: Both vertebral arteries arise from subclavian arteries. No significant proximal subclavian artery stenosis. Vertebral arteries patent without stenosis or evidence for dissection. Other: None IMPRESSION: MRI HEAD: 1. No acute intracranial abnormality. 2. Moderate chronic microvascular ischemic disease, with small remote left pontine lacunar infarct. 3. Partially empty sella. MRA HEAD: Normal intracranial MRA. No large vessel occlusion, hemodynamically significant stenosis, or other acute vascular abnormality. MRA NECK: Normal MRA of the neck.  Electronically Signed   By: Jeannine Boga M.D.   On: 07/19/2021 20:01   MR BRAIN WO CONTRAST  Result Date: 07/19/2021 CLINICAL DATA:  Initial evaluation for neuro deficit, stroke suspected. EXAM: MRI  HEAD WITHOUT CONTRAST MRA HEAD WITHOUT CONTRAST MRA NECK WITHOUT AND WITH CONTRAST TECHNIQUE: Multiplanar, multi-echo pulse sequences of the brain and surrounding structures were acquired without intravenous contrast. Angiographic images of the Circle of Willis were acquired using MRA technique without intravenous contrast. Angiographic images of the neck were acquired using MRA technique without and with intravenous contrast. Carotid stenosis measurements (when applicable) are obtained utilizing NASCET criteria, using the distal internal carotid diameter as the denominator. CONTRAST:  See injection documentation. COMPARISON:  Prior CT from earlier the same day as well as recent MRI from 07/11/2021. FINDINGS: MRI HEAD FINDINGS Brain: Cerebral volume within normal limits. Patchy T2/FLAIR hyperintensity involving the periventricular deep white matter both cerebral hemispheres most consistent with chronic small vessel ischemic disease, moderate in nature. Small remote left pontine lacunar infarct again noted. No abnormal foci of restricted diffusion to suggest acute or subacute ischemia. Gray-white matter differentiation maintained. No areas of chronic cortical infarction. No acute or chronic intracranial blood products. No mass lesion or midline shift. No hydrocephalus or extra-axial fluid collection. Partially empty sella noted. Midline structures intact. Vascular: Major intracranial vascular flow voids are maintained. Skull and upper cervical spine: Craniocervical junction within normal limits. Bone marrow signal intensity diffusely decreased on T1 weighted sequence, nonspecific, but most commonly related to anemia, smoking or obesity. No focal marrow replacing lesion. No scalp soft tissue abnormality.  Sinuses/Orbits: Bilateral axial myopia noted. Globes and orbital soft tissues demonstrate no acute finding. Scattered mucosal thickening noted about the ethmoidal air cells. Few small maxillary sinus retention cyst noted. No mastoid effusion. Other: None. MRA HEAD FINDINGS Anterior circulation: Visualized distal cervical segments of the internal carotid arteries widely patent with antegrade flow. Petrous, cavernous, and supraclinoid segments patent without stenosis or other abnormality. A1 segments patent. Normal anterior communicating complex. Anterior cerebral arteries patent without stenosis. No M1 stenosis or occlusion. Normal MCA bifurcations. Distal MCA branches well perfused and symmetric. Posterior circulation: Vertebral arteries largely code dominant and patent to the vertebrobasilar junction without stenosis. Both PICA origins patent and normal. Basilar patent to its distal aspect without stenosis. Superior cerebellar arteries patent bilaterally. Both PCAs primarily supplied via the basilar. Prominent left posterior communicating artery noted. Both PCAs well perfused to their distal aspects without stenosis. Anatomic variants: None significant. No aneurysm. MRA NECK FINDINGS Aortic arch: Visualized aortic arch normal in caliber. Bovine branching pattern noted. No stenosis about the origin of the great vessels. Right carotid system: Right common and internal carotid arteries patent without stenosis or evidence for dissection. No significant atheromatous narrowing about the right carotid bulb. Left carotid system: Left common and internal carotid arteries patent without stenosis or evidence for dissection. No significant atheromatous narrowing about the left carotid bulb. Vertebral arteries: Both vertebral arteries arise from subclavian arteries. No significant proximal subclavian artery stenosis. Vertebral arteries patent without stenosis or evidence for dissection. Other: None IMPRESSION: MRI HEAD: 1. No  acute intracranial abnormality. 2. Moderate chronic microvascular ischemic disease, with small remote left pontine lacunar infarct. 3. Partially empty sella. MRA HEAD: Normal intracranial MRA. No large vessel occlusion, hemodynamically significant stenosis, or other acute vascular abnormality. MRA NECK: Normal MRA of the neck. Electronically Signed   By: Jeannine Boga M.D.   On: 07/19/2021 20:01   _______________________________________________________________________________________________________ Latest  Blood pressure 112/64, pulse 86, temperature (!) 97.4 F (36.3 C), temperature source Oral, resp. rate (!) 31, weight 114.4 kg, SpO2 100 %.   Vitals  labs and radiology finding personally reviewed  Review of Systems:  Pertinent positives include:  weight loss  fatigue,   Constitutional:  No weight loss, night sweats, Fevers, chills, HEENT:  No headaches, Difficulty swallowing,Tooth/dental problems,Sore throat,  No sneezing, itching, ear ache, nasal congestion, post nasal drip,  Cardio-vascular:  No chest pain, Orthopnea, PND, anasarca, dizziness, palpitations.no Bilateral lower extremity swelling  GI:  No heartburn, indigestion, abdominal pain, nausea, vomiting, diarrhea, change in bowel habits, loss of appetite, melena, blood in stool, hematemesis Resp:  no shortness of breath at rest. No dyspnea on exertion, No excess mucus, no productive cough, No non-productive cough, No coughing up of blood.No change in color of mucus.No wheezing. Skin:  no rash or lesions. No jaundice GU:  no dysuria, change in color of urine, no urgency or frequency. No straining to urinate.  No flank pain.  Musculoskeletal:  No joint pain or no joint swelling. No decreased range of motion. No back pain.  Psych:  No change in mood or affect. No depression or anxiety. No memory loss.  Neuro: no localizing neurological complaints, no tingling, no weakness, no double vision, no gait abnormality, no  slurred speech, no confusion  All systems reviewed and apart from [place] all are negative _______________________________________________________________________________________________ Past Medical History:   Past Medical History:  Diagnosis Date   Acute encephalopathy    Acute renal failure ([place])    COVID-19    11-29-20   Diabetes mellitus without complication ([place])    Diabetic ketoacidosis with coma associated with type 2 diabetes mellitus ([place])    DKA (diabetic ketoacidoses) 08/05/2015   High cholesterol    History of stomach ulcers    Hypertension       Past Surgical History:  Procedure Laterality Date   APPENDECTOMY     polyp removal     TONSILLECTOMY      Social History:  Ambulatory   independently       reports that he has quit smoking. His smoking use included cigarettes. He has never used smokeless tobacco. He reports that he does not drink alcohol and does not use drugs.     Family History:   Family History  Problem Relation Age of Onset   Diabetes Father    Hypertension Father    Colon cancer Neg Hx    Stomach cancer Neg Hx    Rectal cancer Neg Hx    Esophageal cancer Neg Hx    ______________________________________________________________________________________________ Allergies: Allergies  Allergen Reactions   Nsaids Other (See Comments)    H/o ulcers      Prior to Admission medications   Medication Sig Start Date End Date Taking? Authorizing Provider  amLODipine (NORVASC) 10 MG tablet Take 1 tablet (10 mg total) by mouth daily. 06/05/21 08/19/21  Dorie Rank, MD  aspirin EC 81 MG tablet Take 81 mg by mouth daily. Swallow whole.    [provider]  atorvastatin (LIPITOR) 20 MG tablet Take 20 mg by mouth daily.    [provider]  bismuth subsalicylate (PEPTO BISMOL) 262 MG/15ML suspension Take 30 mLs by mouth every 6 (six) hours as needed for diarrhea or loose stools or indigestion.    [provider]  blood glucose  meter kit and supplies Dispense based on patient and insurance preference. Use up to four times daily as directed. (FOR ICD-9 250.00, 250.01). 08/21/15   Velvet Bathe, MD  doxepin (SINEQUAN) 50 MG capsule Take 50 mg by mouth at bedtime.    [provider]  glucose blood (FREESTYLE LITE) test strip Use as instructed 08/15/15  Dhungel, Nishant, MD  hydrochlorothiazide (HYDRODIURIL) 25 MG tablet Take 25 mg by mouth daily.    [provider]  HYDROcodone-acetaminophen (NORCO/VICODIN) 5-325 MG tablet Take 1 tablet by mouth every 6 (six) hours as needed. 06/05/21   Dorie Rank, MD  insulin aspart (NOVOLOG) 100 UNIT/ML injection Inject 30 Units into the skin 2 (two) times daily before a meal.    [provider]  insulin detemir (LEVEMIR) 100 UNIT/ML injection Inject 0.16 mLs (16 Units total) into the skin daily. Patient taking differently: Inject 30 Units into the skin 2 (two) times daily. 08/21/15   Velvet Bathe, MD  insulin starter kit- pen needles MISC 1 kit by Other route once. 08/21/15   Velvet Bathe, MD  INSULIN SYRINGE .5CC/28G 28G X 1/2" 0.5 ML MISC 1 application by Does not apply route daily. 08/15/15   Dhungel, Flonnie Overman, MD  metFORMIN (GLUCOPHAGE-XR) 500 MG 24 hr tablet Take 1,000 mg by mouth 2 (two) times daily.    [provider]  metoCLOPramide (REGLAN) 5 MG tablet Take 1 tablet (5 mg total) by mouth every 8 (eight) hours as needed for nausea or vomiting. 01/17/21   [name], MD  omeprazole (PRILOSEC) 20 MG capsule Take 20 mg by mouth 2 (two) times daily.    [provider]  OVER THE COUNTER MEDICATION Take 2 tablets by mouth daily.    [provider]  Semaglutide, 1 MG/DOSE, (OZEMPIC, 1 MG/DOSE,) 2 MG/1.5ML SOPN Inject 1 mg into the skin every Saturday.    [provider]  traMADol (ULTRAM) 50 MG tablet Take 1-2 tablets (50-100 mg total) by mouth every 6 (six) hours as needed for moderate pain or severe pain. 01/11/21   [name], MD    ___________________________________________________________________________________________________ Physical Exam: Vitals with BMI 07/19/2021 07/19/2021 07/19/2021  Height - - -  Weight - - -  BMI - - -  Systolic [phone]  Diastolic 64 64 76  Pulse 86 - 99     1. General:  in No  Acute distress   Chronically ill  -appearing 2. Psychological: Alert and   Oriented 3. Head/ENT:    Dry Mucous Membranes                          Head Non traumatic, neck supple                           Poor Dentition 4. SKIN: decreased Skin turgor,  Skin clean Dry and intact no rash 5. Heart: Regular rate and rhythm no  Murmur, no Rub or gallop 6. Lungs:  , no wheezes or crackles   7. Abdomen: Soft,  non-tender, Non distended   obese  bowel sounds present 8. Lower extremities: no clubbing, cyanosis, no  edema 9. Neurologically  strength 5 out of 5 in all 4 extremities cranial nerves II through XII intact 10. MSK: Normal range of motion    Chart has been reviewed  ______________________________________________________________________________________________  Assessment/Plan  66 y.o. 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    Obesity hypoventilation syndrome (HCC) CPAP as needed  Severe obesity (BMI 35.0-39.9) with comorbidity ([place]) Will need outpatient referral to dietitian clinic  Insulin-requiring or dependent type II diabetes mellitus ([place])  - Order Sensitive SSI   - hold novolog given AKI and poor po intake depending on PO intake and glucose levels may need to switch to longer lasting    -  check TSH and HgA1C  - Hold by mouth medications     Diabetic gastroparesis associated with type 2 diabetes mellitus ([place]) Has been off Reglan for years  Hypertension Allow permissive hypertension given soft blood pressures  History of CVA with residual deficit Continue aspirin and statin  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  Obtain cortisol level and TSH.  Monitor for any evidence of adrenal insufficiency  Dehydration Secondary to p.o. intake decrease will rehydrate Work-up causes for poor appetite.  Patient is on Ozempic which can cause decreased p.o. intake and appetite decreased.  OSA on CPAP Restart CPAP    Other plan as per orders.  DVT prophylaxis:  SCD        Code Status:    Code Status: Prior FULL CODE   as per patient   I had personally discussed CODE STATUS with patient      Family Communication:   Family not at  Bedside    Disposition Plan:       To home once workup is complete and patient is stable   Following barriers for discharge:                            Electrolytes corrected                                                          Will need to be able to tolerate PO                                           Would benefit from PT/OT eval prior to DC  Ordered                                      Diabetes care coordinator                    Nutrition    consulted               Consults called:  none    Admission status:   ED Disposition     ED Disposition  Admit   Condition  --   Comment  Hospital Area: [facility] [[id number]]  Level of Care: Telemetry [5]  Admit to tele based on following criteria: Other see comments  Comments: hypokalemia  May place patient in observation at [facility] or [facility] if equivalent level of care is available:: No  Covid Evaluation: Asymptomatic Screening Protocol (No Symptoms)  Diagnosis: AKI (acute kidney injury) [facility]) [[id number]]  Admitting Physician: Toy Baker [[id number]]  Attending Physician: Toy Baker [[id number]]           Obs      Level of care     tele  For 12H     Lab Results  Component Value Date   [place] 07/19/2021     Precautions: admitted as   Covid Negative    Quinley Nesler 07/19/2021, 10:20 PM    [facility]     after 2 AM please page floor coverage PA If 7AM-7PM, please contact the day team taking care of the patient using [email or web address]   Patient was evaluated in the context of the global COVID-19 pandemic, which necessitated consideration that the patient might be at risk for infection with the SARS-CoV-2 virus that causes COVID-19. Institutional protocols and algorithms that pertain to the evaluation of patients at risk for COVID-19 are in a state of rapid change based on information released by regulatory bodies including the CDC and federal and state organizations. These policies and algorithms were followed during the patient's care.

## 2021-07-19 NOTE — ED Provider Notes (Signed)
[facility] DEPT Provider Note   CSN: [id number] Arrival date & time: 07/19/21  1142     History  Chief Complaint  Patient presents with   Dizziness   Weakness    Micheal Gomez is a 66 y.o. male.   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  The patient states that he was seen in the emergency department on 07/11/2021 for dysarthria and difficulty ambulating and was diagnosed with a pontine infarct.  He followed with neurology outpatient in clinic on 07/13/2021 and was started on aspirin.  Since then, the patient states that he has had persistent difficulty walking.  His dysarthria has been improving.  He states that he had an acute worsening of his difficulty with ambulation over the past 2 days.  Beyond that he denies any new neurologic deficits.  He was referred to speech therapy and advised to follow-up with the [facility] or return to clinic with neuro if symptoms worsen.  Home Medications Prior to Admission medications   Medication Sig Start Date End Date Taking? Authorizing Provider  amLODipine (NORVASC) 10 MG tablet Take 1 tablet (10 mg total) by mouth daily. 06/05/21 08/19/21  Dorie Rank, MD  aspirin EC 81 MG tablet Take 81 mg by mouth daily. Swallow whole.    [provider]  atorvastatin (LIPITOR) 20 MG tablet Take 20 mg by mouth daily.    [provider]  bismuth subsalicylate (PEPTO BISMOL) 262 MG/15ML suspension Take 30 mLs by mouth every 6 (six) hours as needed for diarrhea or loose stools or indigestion.    [provider]  blood glucose meter kit and supplies Dispense based on patient and insurance preference. Use up to four times daily as directed. (FOR ICD-9 250.00, 250.01).  08/21/15   Velvet Bathe, MD  doxepin (SINEQUAN) 50 MG capsule Take 50 mg by mouth at bedtime.    [provider]  glucose blood (FREESTYLE LITE) test strip Use as instructed 08/15/15   Dhungel, Nishant, MD  hydrochlorothiazide (HYDRODIURIL) 25 MG tablet Take 25 mg by mouth daily.    [provider]  HYDROcodone-acetaminophen (NORCO/VICODIN) 5-325 MG tablet Take 1 tablet by mouth every 6 (six) hours as needed. 06/05/21   Dorie Rank, MD  insulin aspart (NOVOLOG) 100 UNIT/ML injection Inject 30 Units into the skin 2 (two) times daily before a meal.    [provider]  insulin detemir (LEVEMIR) 100 UNIT/ML injection Inject 0.16 mLs (16 Units total) into the skin daily. Patient taking differently: Inject 30 Units into the skin 2 (two) times daily. 08/21/15   Velvet Bathe, MD  insulin starter kit- pen needles MISC 1 kit by Other route once. 08/21/15   Velvet Bathe, MD  INSULIN SYRINGE .5CC/28G 28G X 1/2" 0.5 ML MISC 1 application by Does not apply route daily. 08/15/15   Dhungel, Flonnie Overman, MD  metFORMIN (GLUCOPHAGE-XR) 500 MG 24 hr tablet Take 1,000 mg by mouth 2 (two) times daily.    [provider]  metoCLOPramide (REGLAN) 5 MG tablet Take 1 tablet (5 mg total) by mouth every 8 (eight) hours as needed for nausea or vomiting. 01/17/21   [name], MD  omeprazole (PRILOSEC) 20 MG capsule Take 20 mg by mouth 2 (two) times daily.    [provider]  OVER THE COUNTER MEDICATION Take 2 tablets by mouth daily.    [provider]  Semaglutide, 1 MG/DOSE, (OZEMPIC, 1 MG/DOSE,) 2 MG/1.5ML SOPN Inject 1 mg into the skin every Saturday.    [provider]  traMADol (ULTRAM) 50 MG tablet Take 1-2 tablets (50-100 mg total) by mouth every 6 (six) hours as needed for moderate pain or severe pain. 01/11/21   [name], MD      Allergies    Nsaids    Review of Systems   Review of Systems  Musculoskeletal:  Positive for gait problem.  Neurological:   Positive for speech difficulty, weakness and headaches.  All other systems reviewed and are negative.  Physical Exam Updated Vital Signs BP 112/64    Pulse 99    Temp (!) 97.4 F (36.3 C) (Oral)    Resp (!) 31    Wt 114.4 kg    SpO2 98%    BMI 37.24 kg/m  Physical Exam Vitals and nursing note reviewed.  Constitutional:      General: He is not in acute distress.    Appearance: He is well-developed.  HENT:     Head: Normocephalic and atraumatic.  Eyes:     Conjunctiva/sclera: Conjunctivae normal.  Cardiovascular:     Rate and Rhythm: Normal rate and regular rhythm.     Heart sounds: No murmur heard. Pulmonary:     Effort: Pulmonary effort is normal. No respiratory distress.     Breath sounds: Normal breath sounds.  Abdominal:     Palpations: Abdomen is soft.     Tenderness: There is no abdominal tenderness.  Musculoskeletal:        General: No swelling.     Cervical back: Neck supple.  Skin:    General: Skin is warm and dry.     Capillary Refill: Capillary refill takes less than 2 seconds.  Neurological:     Mental Status: He is alert.     Comments: MENTAL STATUS EXAM:    Orientation: Alert and oriented to person, place and time.  Memory: Cooperative, follows commands well.  Language: Speech is dysarthric and slurred  CRANIAL NERVES:    CN 2 (Optic): Visual fields intact to confrontation.  CN 3,4,6 (EOM): Pupils equal and reactive to light. Full extraocular eye movement without nystagmus.  CN 5 (Trigeminal): Facial sensation is normal, no weakness of masticatory muscles.  CN 7 (Facial): No facial weakness or asymmetry.  CN 8 (Auditory): Auditory acuity grossly normal.  CN 9,10 (Glossophar): The uvula is midline, the palate elevates symmetrically.  CN 11 (spinal access): Normal sternocleidomastoid and trapezius strength.  CN 12 (Hypoglossal): The tongue is midline. No atrophy or fasciculations.Marland Kitchen   MOTOR:  Muscle Strength: 5/5RUE, 5/5LUE, 5/5RLE, 5/5LLE.   COORDINATION:    Intact finger-to-nose, no tremor, no pronator drift.   SENSATION:   Intact to light touch all four extremities.  GAIT: Gait mildly ataxic   Psychiatric:        Mood and Affect: Mood normal.    ED Results / Procedures / Treatments   Labs (all labs ordered are listed, but only abnormal results are displayed) Labs Reviewed  COMPREHENSIVE METABOLIC PANEL - Abnormal; Notable for the following components:      Result Value   Potassium 3.4 (*)    Glucose, Bld 101 (*)    BUN 57 (*)    Creatinine, Ser 3.19 (*)    Total Protein 8.2 (*)    GFR, Estimated 21 (*)  All other components within normal limits  CBC  MAGNESIUM  TSH  TROPONIN I (HIGH SENSITIVITY)  TROPONIN I (HIGH SENSITIVITY)    EKG EKG Interpretation  Date/Time:  Wednesday July 19 2021 16:43:23 EST Ventricular Rate:  89 PR Interval:  209 QRS Duration: 84 QT Interval:  362 QTC Calculation: 441 R Axis:   80 Text Interpretation: Sinus rhythm Minimal ST elevation, lateral leads Confirmed by Regan Lemming ([id number]) on 07/19/2021 4:55:15 PM  Radiology CT HEAD WO CONTRAST (5MM)  Result Date: 07/19/2021 CLINICAL DATA:  Acute neuro deficit. EXAM: CT HEAD WITHOUT CONTRAST TECHNIQUE: Contiguous axial images were obtained from the base of the skull through the vertex without intravenous contrast. RADIATION DOSE REDUCTION: This exam was performed according to the departmental dose-optimization program which includes automated exposure control, adjustment of the mA and/or kV according to patient size and/or use of iterative reconstruction technique. COMPARISON:  CT head 07/11/2021.  MRI 07/11/2021 FINDINGS: Brain: No evidence of acute infarction, hemorrhage, hydrocephalus, extra-axial collection or mass lesion/mass effect. Mild white matter hypodensity bilaterally similar to prior studies. Vascular: Negative for hyperdense vessel Skull: Negative Sinuses/Orbits: Negative Other: None IMPRESSION: No acute abnormality. Chronic white  matter changes consistent with microvascular ischemia. Electronically Signed   By: Franchot Gallo M.D.   On: 07/19/2021 17:12   MR ANGIO HEAD WO CONTRAST  Result Date: 07/19/2021 CLINICAL DATA:  Initial evaluation for neuro deficit, stroke suspected. EXAM: MRI HEAD WITHOUT CONTRAST MRA HEAD WITHOUT CONTRAST MRA NECK WITHOUT AND WITH CONTRAST TECHNIQUE: Multiplanar, multi-echo pulse sequences of the brain and surrounding structures were acquired without intravenous contrast. Angiographic images of the Circle of Willis were acquired using MRA technique without intravenous contrast. Angiographic images of the neck were acquired using MRA technique without and with intravenous contrast. Carotid stenosis measurements (when applicable) are obtained utilizing NASCET criteria, using the distal internal carotid diameter as the denominator. CONTRAST:  See injection documentation. COMPARISON:  Prior CT from earlier the same day as well as recent MRI from 07/11/2021. FINDINGS: MRI HEAD FINDINGS Brain: Cerebral volume within normal limits. Patchy T2/FLAIR hyperintensity involving the periventricular deep white matter both cerebral hemispheres most consistent with chronic small vessel ischemic disease, moderate in nature. Small remote left pontine lacunar infarct again noted. No abnormal foci of restricted diffusion to suggest acute or subacute ischemia. Gray-white matter differentiation maintained. No areas of chronic cortical infarction. No acute or chronic intracranial blood products. No mass lesion or midline shift. No hydrocephalus or extra-axial fluid collection. Partially empty sella noted. Midline structures intact. Vascular: Major intracranial vascular flow voids are maintained. Skull and upper cervical spine: Craniocervical junction within normal limits. Bone marrow signal intensity diffusely decreased on T1 weighted sequence, nonspecific, but most commonly related to anemia, smoking or obesity. No focal marrow  replacing lesion. No scalp soft tissue abnormality. Sinuses/Orbits: Bilateral axial myopia noted. Globes and orbital soft tissues demonstrate no acute finding. Scattered mucosal thickening noted about the ethmoidal air cells. Few small maxillary sinus retention cyst noted. No mastoid effusion. Other: None. MRA HEAD FINDINGS Anterior circulation: Visualized distal cervical segments of the internal carotid arteries widely patent with antegrade flow. Petrous, cavernous, and supraclinoid segments patent without stenosis or other abnormality. A1 segments patent. Normal anterior communicating complex. Anterior cerebral arteries patent without stenosis. No M1 stenosis or occlusion. Normal MCA bifurcations. Distal MCA branches well perfused and symmetric. Posterior circulation: Vertebral arteries largely code dominant and patent to the vertebrobasilar junction without stenosis. Both PICA origins patent and normal. Basilar patent to its  distal aspect without stenosis. Superior cerebellar arteries patent bilaterally. Both PCAs primarily supplied via the basilar. Prominent left posterior communicating artery noted. Both PCAs well perfused to their distal aspects without stenosis. Anatomic variants: None significant. No aneurysm. MRA NECK FINDINGS Aortic arch: Visualized aortic arch normal in caliber. Bovine branching pattern noted. No stenosis about the origin of the great vessels. Right carotid system: Right common and internal carotid arteries patent without stenosis or evidence for dissection. No significant atheromatous narrowing about the right carotid bulb. Left carotid system: Left common and internal carotid arteries patent without stenosis or evidence for dissection. No significant atheromatous narrowing about the left carotid bulb. Vertebral arteries: Both vertebral arteries arise from subclavian arteries. No significant proximal subclavian artery stenosis. Vertebral arteries patent without stenosis or evidence for  dissection. Other: None IMPRESSION: MRI HEAD: 1. No acute intracranial abnormality. 2. Moderate chronic microvascular ischemic disease, with small remote left pontine lacunar infarct. 3. Partially empty sella. MRA HEAD: Normal intracranial MRA. No large vessel occlusion, hemodynamically significant stenosis, or other acute vascular abnormality. MRA NECK: Normal MRA of the neck. Electronically Signed   By: Jeannine Boga M.D.   On: 07/19/2021 20:01   MR Angiogram Neck W or Wo Contrast  Result Date: 07/19/2021 CLINICAL DATA:  Initial evaluation for neuro deficit, stroke suspected. EXAM: MRI HEAD WITHOUT CONTRAST MRA HEAD WITHOUT CONTRAST MRA NECK WITHOUT AND WITH CONTRAST TECHNIQUE: Multiplanar, multi-echo pulse sequences of the brain and surrounding structures were acquired without intravenous contrast. Angiographic images of the Circle of Willis were acquired using MRA technique without intravenous contrast. Angiographic images of the neck were acquired using MRA technique without and with intravenous contrast. Carotid stenosis measurements (when applicable) are obtained utilizing NASCET criteria, using the distal internal carotid diameter as the denominator. CONTRAST:  See injection documentation. COMPARISON:  Prior CT from earlier the same day as well as recent MRI from 07/11/2021. FINDINGS: MRI HEAD FINDINGS Brain: Cerebral volume within normal limits. Patchy T2/FLAIR hyperintensity involving the periventricular deep white matter both cerebral hemispheres most consistent with chronic small vessel ischemic disease, moderate in nature. Small remote left pontine lacunar infarct again noted. No abnormal foci of restricted diffusion to suggest acute or subacute ischemia. Gray-white matter differentiation maintained. No areas of chronic cortical infarction. No acute or chronic intracranial blood products. No mass lesion or midline shift. No hydrocephalus or extra-axial fluid collection. Partially empty sella  noted. Midline structures intact. Vascular: Major intracranial vascular flow voids are maintained. Skull and upper cervical spine: Craniocervical junction within normal limits. Bone marrow signal intensity diffusely decreased on T1 weighted sequence, nonspecific, but most commonly related to anemia, smoking or obesity. No focal marrow replacing lesion. No scalp soft tissue abnormality. Sinuses/Orbits: Bilateral axial myopia noted. Globes and orbital soft tissues demonstrate no acute finding. Scattered mucosal thickening noted about the ethmoidal air cells. Few small maxillary sinus retention cyst noted. No mastoid effusion. Other: None. MRA HEAD FINDINGS Anterior circulation: Visualized distal cervical segments of the internal carotid arteries widely patent with antegrade flow. Petrous, cavernous, and supraclinoid segments patent without stenosis or other abnormality. A1 segments patent. Normal anterior communicating complex. Anterior cerebral arteries patent without stenosis. No M1 stenosis or occlusion. Normal MCA bifurcations. Distal MCA branches well perfused and symmetric. Posterior circulation: Vertebral arteries largely code dominant and patent to the vertebrobasilar junction without stenosis. Both PICA origins patent and normal. Basilar patent to its distal aspect without stenosis. Superior cerebellar arteries patent bilaterally. Both PCAs primarily supplied via the basilar. Prominent left  posterior communicating artery noted. Both PCAs well perfused to their distal aspects without stenosis. Anatomic variants: None significant. No aneurysm. MRA NECK FINDINGS Aortic arch: Visualized aortic arch normal in caliber. Bovine branching pattern noted. No stenosis about the origin of the great vessels. Right carotid system: Right common and internal carotid arteries patent without stenosis or evidence for dissection. No significant atheromatous narrowing about the right carotid bulb. Left carotid system: Left common  and internal carotid arteries patent without stenosis or evidence for dissection. No significant atheromatous narrowing about the left carotid bulb. Vertebral arteries: Both vertebral arteries arise from subclavian arteries. No significant proximal subclavian artery stenosis. Vertebral arteries patent without stenosis or evidence for dissection. Other: None IMPRESSION: MRI HEAD: 1. No acute intracranial abnormality. 2. Moderate chronic microvascular ischemic disease, with small remote left pontine lacunar infarct. 3. Partially empty sella. MRA HEAD: Normal intracranial MRA. No large vessel occlusion, hemodynamically significant stenosis, or other acute vascular abnormality. MRA NECK: Normal MRA of the neck. Electronically Signed   By: Jeannine Boga M.D.   On: 07/19/2021 20:01   MR BRAIN WO CONTRAST  Result Date: 07/19/2021 CLINICAL DATA:  Initial evaluation for neuro deficit, stroke suspected. EXAM: MRI HEAD WITHOUT CONTRAST MRA HEAD WITHOUT CONTRAST MRA NECK WITHOUT AND WITH CONTRAST TECHNIQUE: Multiplanar, multi-echo pulse sequences of the brain and surrounding structures were acquired without intravenous contrast. Angiographic images of the Circle of Willis were acquired using MRA technique without intravenous contrast. Angiographic images of the neck were acquired using MRA technique without and with intravenous contrast. Carotid stenosis measurements (when applicable) are obtained utilizing NASCET criteria, using the distal internal carotid diameter as the denominator. CONTRAST:  See injection documentation. COMPARISON:  Prior CT from earlier the same day as well as recent MRI from 07/11/2021. FINDINGS: MRI HEAD FINDINGS Brain: Cerebral volume within normal limits. Patchy T2/FLAIR hyperintensity involving the periventricular deep white matter both cerebral hemispheres most consistent with chronic small vessel ischemic disease, moderate in nature. Small remote left pontine lacunar infarct again noted.  No abnormal foci of restricted diffusion to suggest acute or subacute ischemia. Gray-white matter differentiation maintained. No areas of chronic cortical infarction. No acute or chronic intracranial blood products. No mass lesion or midline shift. No hydrocephalus or extra-axial fluid collection. Partially empty sella noted. Midline structures intact. Vascular: Major intracranial vascular flow voids are maintained. Skull and upper cervical spine: Craniocervical junction within normal limits. Bone marrow signal intensity diffusely decreased on T1 weighted sequence, nonspecific, but most commonly related to anemia, smoking or obesity. No focal marrow replacing lesion. No scalp soft tissue abnormality. Sinuses/Orbits: Bilateral axial myopia noted. Globes and orbital soft tissues demonstrate no acute finding. Scattered mucosal thickening noted about the ethmoidal air cells. Few small maxillary sinus retention cyst noted. No mastoid effusion. Other: None. MRA HEAD FINDINGS Anterior circulation: Visualized distal cervical segments of the internal carotid arteries widely patent with antegrade flow. Petrous, cavernous, and supraclinoid segments patent without stenosis or other abnormality. A1 segments patent. Normal anterior communicating complex. Anterior cerebral arteries patent without stenosis. No M1 stenosis or occlusion. Normal MCA bifurcations. Distal MCA branches well perfused and symmetric. Posterior circulation: Vertebral arteries largely code dominant and patent to the vertebrobasilar junction without stenosis. Both PICA origins patent and normal. Basilar patent to its distal aspect without stenosis. Superior cerebellar arteries patent bilaterally. Both PCAs primarily supplied via the basilar. Prominent left posterior communicating artery noted. Both PCAs well perfused to their distal aspects without stenosis. Anatomic variants: None significant. No aneurysm. MRA  NECK FINDINGS Aortic arch: Visualized aortic arch  normal in caliber. Bovine branching pattern noted. No stenosis about the origin of the great vessels. Right carotid system: Right common and internal carotid arteries patent without stenosis or evidence for dissection. No significant atheromatous narrowing about the right carotid bulb. Left carotid system: Left common and internal carotid arteries patent without stenosis or evidence for dissection. No significant atheromatous narrowing about the left carotid bulb. Vertebral arteries: Both vertebral arteries arise from subclavian arteries. No significant proximal subclavian artery stenosis. Vertebral arteries patent without stenosis or evidence for dissection. Other: None IMPRESSION: MRI HEAD: 1. No acute intracranial abnormality. 2. Moderate chronic microvascular ischemic disease, with small remote left pontine lacunar infarct. 3. Partially empty sella. MRA HEAD: Normal intracranial MRA. No large vessel occlusion, hemodynamically significant stenosis, or other acute vascular abnormality. MRA NECK: Normal MRA of the neck. Electronically Signed   By: Jeannine Boga M.D.   On: 07/19/2021 20:01    Procedures Procedures    Medications Ordered in ED Medications  lactated ringers infusion (has no administration in time range)  lactated ringers bolus 1,000 mL (1,000 mLs Intravenous Bolus 07/19/21 1758)  gadobutrol (GADAVIST) 1 MMOL/ML injection 10 mL (10 mLs Intravenous Contrast Given 07/19/21 2012)    ED Course/ Medical Decision Making/ A&P                           Medical Decision Making Amount and/or Complexity of Data Reviewed Labs: ordered. Radiology: ordered.  Risk Prescription drug management.   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  The patient states that he was seen in the emergency department on 07/11/2021 for dysarthria and  difficulty ambulating and was diagnosed with a pontine infarct.  He followed with neurology outpatient in clinic on 07/13/2021 and was started on aspirin.  Since then, the patient states that he has had persistent difficulty walking.  His dysarthria has been improving.  He states that he had an acute worsening of his difficulty with ambulation over the past 2 days.  Beyond that he denies any new neurologic deficits.  He was referred to speech therapy and advised to follow-up with the [facility] or return to clinic with neuro if symptoms worsen.  On exam, the patient had persistent dysarthria with a mildly ataxic gait noted.  Vitals were stable on arrival, normal sinus rhythm noted on cardiac telemetry.  Due to the patient's recent diagnosis of pontine infarct, worsening symptoms, concern for potential new infarct in the posterior circulation.  MRI was obtained which resulted negative for new infarcts, revealed persistent pontine infarct.  Screening laboratory work-up was concerning for new AKI with a creatinine of 3.19 and a BUN of 57 up from 1.45 8 days ago.  The patient was administered an IV fluid bolus for volume resuscitation.  On reassessment, he was well-appearing and tolerating oral intake.  I informed him of his negative MRI findings.  I advised follow-up with his [place] clinic and outpatient neurology.  At this time, the patient is tolerating oral intake and feel that he can continue to rehydrate at home. He is making urine.  Advised that he follow-up with the [facility] for a recheck of his creatinine within the next week.      Final Clinical Impression(s) / ED Diagnoses Final diagnoses:  Dehydration  Dizziness  AKI (acute  kidney injury) ([place])  Left pontine stroke [facility])  Dysarthria  Gait abnormality    Rx / DC Orders ED Discharge Orders     None         Regan Lemming, MD 07/19/21 2019

## 2021-07-19 NOTE — Assessment & Plan Note (Signed)
Continue aspirin and statin. 

## 2021-07-19 NOTE — ED Provider Triage Note (Signed)
Emergency Medicine Provider Triage Evaluation Note  Micheal Gomez , a 66 y.o. male  was evaluated in triage.  Pt complains of dizziness and weakness. He states that same has been ongoing for the past 2 weeks. Patient was seen here 8 days ago for similar symptoms with slurred speech since 2010, received an MRI brain and was seen by neurology who told him he had a pontine stroke at some point. He was started on 81mg  ASA daily for same. The patient states that these symptoms were present when he saw neurology 8 days ago and he was told that they can be symptoms of a pontine stroke. He repeatedly states that his symptoms has not changed since he saw neurology. States he has been unable to walk and feels like the room is spinning. Symptoms are worse when he stands.  Review of Systems  Positive:  Negative: See above  Physical Exam  Wt 114.4 kg    BMI 37.24 kg/m  Gen:   Awake, no distress   Resp:  Normal effort  MSK:   Moves extremities without difficulty  Other:  Overall neurologically intact  Medical Decision Making  Medically screening exam initiated at 12:54 PM.  Appropriate orders placed.  Micheal Gomez was informed that the remainder of the evaluation will be completed by another provider, this initial triage assessment does not replace that evaluation, and the importance of remaining in the ED until their evaluation is complete.  No change since MRI brain 8 days ago   Nestor Lewandowsky 07/19/21 1258

## 2021-07-19 NOTE — Assessment & Plan Note (Signed)
Allow permissive hypertension given soft blood pressures

## 2021-07-19 NOTE — Assessment & Plan Note (Addendum)
-   Order Sensitive SSI   - hold novolog given AKI and poor po intake depending on PO intake and glucose levels may need to switch to longer lasting    -  check TSH and HgA1C  - Hold by mouth medications

## 2021-07-19 NOTE — Assessment & Plan Note (Signed)
Re-start CPAP 

## 2021-07-19 NOTE — ED Triage Notes (Signed)
Patient presents due to difficulty walking. He feels trembling in the legs and dizziness for 2 weeks.

## 2021-07-19 NOTE — ED Notes (Signed)
Remains in MRI 

## 2021-07-19 NOTE — Assessment & Plan Note (Signed)
Noted partially empty sella turcica on MRI.  Obtain cortisol level and TSH.  Monitor for any evidence of adrenal insufficiency

## 2021-07-19 NOTE — Discharge Instructions (Addendum)
You were evaluated in the Emergency Department and after careful evaluation, we did not find any emergent condition requiring admission or further testing in the hospital.  Your exam/testing today was reassuring that it did not reveal evidence of a new stroke on MRI.  You do have an AKI for which you will need to continue to orally rehydrate over the next few days.  Recommend you follow-up with the [facility] for recheck of your kidney function within the next week. Please return to the Emergency Department if you experience any worsening of your condition.  Thank you for allowing [place] to be a part of your care.   Carbohydrate Counting For People With Diabetes  Foods with carbohydrates make your blood glucose level go up. Learning how to count carbohydrates can help you control your blood glucose levels. First, identify the foods you eat that contain carbohydrates. Then, using the Foods with Carbohydrates chart, determine about how much carbohydrates are in your meals and snacks. Make sure you are eating foods with fiber, protein, and healthy fat along with your carbohydrate foods. Foods with Carbohydrates The following table shows carbohydrate foods that have about 15 grams of carbohydrate each. Using measuring cups, spoons, or a food scale when you first begin learning about carbohydrate counting can help you learn about the portion sizes you typically eat. The following foods have 15 grams carbohydrate each:  Grains 1 slice bread (1 ounce)  1 small tortilla (6-inch size)   large bagel (1 ounce)  1/3 cup pasta or rice (cooked)   hamburger or hot dog bun ( ounce)   cup cooked cereal   to  cup ready-to-eat cereal  2 taco shells (5-inch size) Fruit 1 small fresh fruit ( to 1 cup)   medium banana  17 small grapes (3 ounces)  1 cup melon or berries   cup canned or frozen fruit  2 tablespoons dried fruit (blueberries, cherries, cranberries, raisins)   cup unsweetened fruit juice  Starchy  Vegetables  cup cooked beans, peas, corn, potatoes/sweet potatoes   large baked potato (3 ounces)  1 cup acorn or butternut squash  Snack Foods 3 to 6 crackers  8 potato chips or 13 tortilla chips ( ounce to 1 ounce)  3 cups popped popcorn  Dairy 3/4 cup (6 ounces) nonfat plain yogurt, or yogurt with sugar-free sweetener  1 cup milk  1 cup plain rice, soy, coconut or flavored almond milk Sweets and Desserts  cup ice cream or frozen yogurt  1 tablespoon jam, jelly, pancake syrup, table sugar, or honey  2 tablespoons light pancake syrup  1 inch square of frosted cake or 2 inch square of unfrosted cake  2 small cookies (2/3 ounce each) or  large cookie  Sometimes youll have to estimate carbohydrate amounts if you dont know the exact recipe. One cup of mixed foods like soups can have 1 to 2 carbohydrate servings, while some casseroles might have 2 or more servings of carbohydrate. Foods that have less than 20 calories in each serving can be counted as free foods. Count 1 cup raw vegetables, or  cup cooked non-starchy vegetables as free foods. If you eat 3 or more servings at one meal, then count them as 1 carbohydrate serving.  Foods without Carbohydrates  Not all foods contain carbohydrates. Meat, some dairy, fats, non-starchy vegetables, and many beverages dont contain carbohydrate. So when you count carbohydrates, you can generally exclude chicken, pork, beef, fish, seafood, eggs, tofu, cheese, butter, sour cream, avocado, nuts,  seeds, olives, mayonnaise, water, black coffee, unsweetened tea, and zero-calorie drinks. Vegetables with no or low carbohydrate include green beans, cauliflower, tomatoes, and onions. How much carbohydrate should I eat at each meal?  Carbohydrate counting can help you plan your meals and manage your weight. Following are some starting points for carbohydrate intake at each meal. Work with your registered dietitian nutritionist to find the best range that  works for your blood glucose and weight.   To Lose Weight To Maintain Weight  Women 2 - 3 carb servings 3 - 4 carb servings  Men 3 - 4 carb servings 4 - 5 carb servings  Checking your blood glucose after meals will help you know if you need to adjust the timing, type, or number of carbohydrate servings in your meal plan. Achieve and keep a healthy body weight by balancing your food intake and physical activity.  Tips How should I plan my meals?  Plan for half the food on your plate to include non-starchy vegetables, like salad greens, broccoli, or carrots. Try to eat 3 to 5 servings of non-starchy vegetables every day. Have a protein food at each meal. Protein foods include chicken, fish, meat, eggs, or beans (note that beans contain carbohydrate). These two food groups (non-starchy vegetables and proteins) are low in carbohydrate. If you fill up your plate with these foods, you will eat less carbohydrate but still fill up your stomach. Try to limit your carbohydrate portion to  of the plate.  What fats are healthiest to eat?  Diabetes increases risk for heart disease. To help protect your heart, eat more healthy fats, such as olive oil, nuts, and avocado. Eat less saturated fats like butter, cream, and high-fat meats, like bacon and sausage. Avoid trans fats, which are in all foods that list partially hydrogenated oil as an ingredient. What should I drink?  Choose drinks that are not sweetened with sugar. The healthiest choices are water, carbonated or seltzer waters, and tea and coffee without added sugars.  Sweet drinks will make your blood glucose go up very quickly. One serving of soda or energy drink is  cup. It is best to drink these beverages only if your blood glucose is low.  Artificially sweetened, or diet drinks, typically do not increase your blood glucose if they have zero calories in them. Read labels of beverages, as some diet drinks do have carbohydrate and will raise your blood  glucose. Label Reading Tips Read Nutrition Facts labels to find out how many grams of carbohydrate are in a food you want to eat. Dont forget: sometimes serving sizes on the label arent the same as how much food you are going to eat, so you may need to calculate how much carbohydrate is in the food you are serving yourself.   Carbohydrate Counting for People with Diabetes Sample 1-Day Menu  Breakfast  cup yogurt, low fat, low sugar (1 carbohydrate serving)   cup cereal, ready-to-eat, unsweetened (1 carbohydrate serving)  1 cup strawberries (1 carbohydrate serving)   cup almonds ( carbohydrate serving)  Lunch 1, 5 ounce can chunk light tuna  2 ounces cheese, low fat cheddar  6 whole wheat crackers (1 carbohydrate serving)  1 small apple (1 carbohydrate servings)   cup carrots ( carbohydrate serving)   cup snap peas  1 cup 1% milk (1 carbohydrate serving)   Evening Meal Stir fry made with: 3 ounces chicken  1 cup brown rice (3 carbohydrate servings)   cup broccoli ( carbohydrate  serving)   cup green beans   cup onions  1 tablespoon olive oil  2 tablespoons teriyaki sauce ( carbohydrate serving)  Evening Snack 1 extra small banana (1 carbohydrate serving)  1 tablespoon peanut butter   Carbohydrate Counting for People with Diabetes Vegan Sample 1-Day Menu  Breakfast 1 cup cooked oatmeal (2 carbohydrate servings)   cup blueberries (1 carbohydrate serving)  2 tablespoons flaxseeds  1 cup soymilk fortified with calcium and vitamin D  1 cup coffee  Lunch 2 slices whole wheat bread (2 carbohydrate servings)   cup baked tofu   cup lettuce  2 slices tomato  2 slices avocado   cup baby carrots ( carbohydrate serving)  1 orange (1 carbohydrate serving)  1 cup soymilk fortified with calcium and vitamin D   Evening Meal Burrito made with: 1 6-inch corn tortilla (1 carbohydrate serving)  1 cup refried vegetarian beans (2 carbohydrate servings)   cup chopped tomatoes    cup lettuce   cup salsa  1/3 cup brown rice (1 carbohydrate serving)  1 tablespoon olive oil for rice   cup zucchini   Evening Snack 6 small whole grain crackers (1 carbohydrate serving)  2 apricots ( carbohydrate serving)   cup unsalted peanuts ( carbohydrate serving)    Carbohydrate Counting for People with Diabetes Vegetarian (Lacto-Ovo) Sample 1-Day Menu  Breakfast 1 cup cooked oatmeal (2 carbohydrate servings)   cup blueberries (1 carbohydrate serving)  2 tablespoons flaxseeds  1 egg  1 cup 1% milk (1 carbohydrate serving)  1 cup coffee  Lunch 2 slices whole wheat bread (2 carbohydrate servings)  2 ounces low-fat cheese   cup lettuce  2 slices tomato  2 slices avocado   cup baby carrots ( carbohydrate serving)  1 orange (1 carbohydrate serving)  1 cup unsweetened tea  Evening Meal Burrito made with: 1 6-inch corn tortilla (1 carbohydrate serving)   cup refried vegetarian beans (1 carbohydrate serving)   cup tomatoes   cup lettuce   cup salsa  1/3 cup brown rice (1 carbohydrate serving)  1 tablespoon olive oil for rice   cup zucchini  1 cup 1% milk (1 carbohydrate serving)  Evening Snack 6 small whole grain crackers (1 carbohydrate serving)  2 apricots ( carbohydrate serving)   cup unsalted peanuts ( carbohydrate serving)    Copyright 2020  Academy of Nutrition and Dietetics. All rights reserved.  Using Nutrition Labels: Carbohydrate  Serving Size  Look at the serving size. All the information on the label is based on this portion. Servings Per Container  The number of servings contained in the package. Guidelines for Carbohydrate  Look at the total grams of carbohydrate in the serving size.  1 carbohydrate choice = 15 grams of carbohydrate. Range of Carbohydrate Grams Per Choice  Carbohydrate Grams/Choice Carbohydrate Choices  6-10   11-20 1  21-25 1  26-35 2  36-40 2  41-50 3  51-55 3  56-65 4  66-70 4  71-80 5    Copyright  2020  Academy of Nutrition and Dietetics. All rights reserved.   Plate Method for Diabetes   Foods with carbohydrates make your blood glucose level go up. The plate method is a simple way to meal plan and control the amount of carbohydrate you eat.         Use the following guidance to build a healthy plate to control carbohydrates. Divide a 9-inch plate into 3 sections, and consider your beverage the 4th section of  your meal: Food Group Examples of Foods/Beverages for This Section of your Meal  Section 1: Non-starchy vegetables Fill  of your plate to include non-starchy vegetables Asparagus, broccoli, brussels sprouts, cabbage, carrots, cauliflower, celery, cucumber, green beans, mushrooms, peppers, salad greens, tomatoes, or zucchini.  Section 2: Protein foods Fill  of your plate to include a lean protein Lean meat, poultry, fish, seafood, cheese, eggs, lean deli meat, tofu, beans, lentils, nuts or nut butters.  Section 3: Carbohydrate foods Fill  of your plate to include carbohydrate foods Whole grains, whole wheat bread, brown rice, whole grain pasta, polenta, corn tortillas, fruit, or starchy vegetables (potatoes, green peas, corn, beans, acorn squash, and butternut squash). One cup of milk also counts as a food that contains carbohydrate.  Section 4: Beverage Choose water or a low-calorie drink for your beverage. Unsweetened tea, coffee, or flavored/sparkling water without added sugar.  Image reprinted with permission from The American Diabetes Association.  Copyright 2022 by the American Diabetes Association.   Copyright 2022  Academy of Nutrition and Dietetics. All rights reserved

## 2021-07-20 ENCOUNTER — Encounter (HOSPITAL_COMMUNITY): Payer: Self-pay | Admitting: Internal Medicine

## 2021-07-20 DIAGNOSIS — Z6837 Body mass index (BMI) 37.0-37.9, adult: Secondary | ICD-10-CM | POA: Diagnosis not present

## 2021-07-20 DIAGNOSIS — Z28310 Unvaccinated for covid-19: Secondary | ICD-10-CM | POA: Diagnosis not present

## 2021-07-20 DIAGNOSIS — Z8249 Family history of ischemic heart disease and other diseases of the circulatory system: Secondary | ICD-10-CM | POA: Diagnosis not present

## 2021-07-20 DIAGNOSIS — Z7984 Long term (current) use of oral hypoglycemic drugs: Secondary | ICD-10-CM | POA: Diagnosis not present

## 2021-07-20 DIAGNOSIS — K5900 Constipation, unspecified: Secondary | ICD-10-CM | POA: Diagnosis present

## 2021-07-20 DIAGNOSIS — I10 Essential (primary) hypertension: Secondary | ICD-10-CM | POA: Diagnosis present

## 2021-07-20 DIAGNOSIS — E662 Morbid (severe) obesity with alveolar hypoventilation: Secondary | ICD-10-CM | POA: Diagnosis present

## 2021-07-20 DIAGNOSIS — Z20822 Contact with and (suspected) exposure to covid-19: Secondary | ICD-10-CM | POA: Diagnosis present

## 2021-07-20 DIAGNOSIS — I69322 Dysarthria following cerebral infarction: Secondary | ICD-10-CM | POA: Diagnosis not present

## 2021-07-20 DIAGNOSIS — E1143 Type 2 diabetes mellitus with diabetic autonomic (poly)neuropathy: Secondary | ICD-10-CM | POA: Diagnosis present

## 2021-07-20 DIAGNOSIS — K3184 Gastroparesis: Secondary | ICD-10-CM | POA: Diagnosis present

## 2021-07-20 DIAGNOSIS — Z79899 Other long term (current) drug therapy: Secondary | ICD-10-CM | POA: Diagnosis not present

## 2021-07-20 DIAGNOSIS — Z87891 Personal history of nicotine dependence: Secondary | ICD-10-CM | POA: Diagnosis not present

## 2021-07-20 DIAGNOSIS — Z7982 Long term (current) use of aspirin: Secondary | ICD-10-CM | POA: Diagnosis not present

## 2021-07-20 DIAGNOSIS — Z794 Long term (current) use of insulin: Secondary | ICD-10-CM | POA: Diagnosis not present

## 2021-07-20 DIAGNOSIS — Z833 Family history of diabetes mellitus: Secondary | ICD-10-CM | POA: Diagnosis not present

## 2021-07-20 DIAGNOSIS — N179 Acute kidney failure, unspecified: Secondary | ICD-10-CM | POA: Diagnosis present

## 2021-07-20 DIAGNOSIS — E860 Dehydration: Secondary | ICD-10-CM | POA: Diagnosis present

## 2021-07-20 DIAGNOSIS — E7800 Pure hypercholesterolemia, unspecified: Secondary | ICD-10-CM | POA: Diagnosis present

## 2021-07-20 LAB — COMPREHENSIVE METABOLIC PANEL
ALT: 14 U/L (ref 0–44)
AST: 14 U/L — ABNORMAL LOW (ref 15–41)
Albumin: 3.2 g/dL — ABNORMAL LOW (ref 3.5–5.0)
Alkaline Phosphatase: 97 U/L (ref 38–126)
Anion gap: 8 (ref 5–15)
BUN: 49 mg/dL — ABNORMAL HIGH (ref 8–23)
CO2: 22 mmol/L (ref 22–32)
Calcium: 8.7 mg/dL — ABNORMAL LOW (ref 8.9–10.3)
Chloride: 105 mmol/L (ref 98–111)
Creatinine, Ser: 2.44 mg/dL — ABNORMAL HIGH (ref 0.61–1.24)
GFR, Estimated: 28 mL/min — ABNORMAL LOW (ref >60)
Glucose, Bld: 129 mg/dL — ABNORMAL HIGH (ref 70–99)
Potassium: 3.3 mmol/L — ABNORMAL LOW (ref 3.5–5.1)
Sodium: 135 mmol/L (ref 135–145)
Total Bilirubin: 0.5 mg/dL (ref 0.3–1.2)
Total Protein: 6.7 g/dL (ref 6.5–8.1)

## 2021-07-20 LAB — URINALYSIS, ROUTINE W REFLEX MICROSCOPIC
Bilirubin Urine: NEGATIVE
Glucose, UA: NEGATIVE mg/dL
Hgb urine dipstick: NEGATIVE
Ketones, ur: NEGATIVE mg/dL
Leukocytes,Ua: NEGATIVE
Nitrite: NEGATIVE
Protein, ur: NEGATIVE mg/dL
Specific Gravity, Urine: 1.018 (ref 1.005–1.030)
pH: 5 (ref 5.0–8.0)

## 2021-07-20 LAB — CBC WITH DIFFERENTIAL/PLATELET
Abs Immature Granulocytes: 0.01 10*3/uL (ref 0.00–0.07)
Basophils Absolute: 0 10*3/uL (ref 0.0–0.1)
Basophils Relative: 1 %
Eosinophils Absolute: 0.2 10*3/uL (ref 0.0–0.5)
Eosinophils Relative: 3 %
HCT: 35.8 % — ABNORMAL LOW (ref 39.0–52.0)
Hemoglobin: 11.8 g/dL — ABNORMAL LOW (ref 13.0–17.0)
Immature Granulocytes: 0 %
Lymphocytes Relative: 31 %
Lymphs Abs: 2.1 10*3/uL (ref 0.7–4.0)
MCH: 27.5 pg (ref 26.0–34.0)
MCHC: 33 g/dL (ref 30.0–36.0)
MCV: 83.4 fL (ref 80.0–100.0)
Monocytes Absolute: 0.4 10*3/uL (ref 0.1–1.0)
Monocytes Relative: 6 %
Neutro Abs: 4 10*3/uL (ref 1.7–7.7)
Neutrophils Relative %: 59 %
Platelets: 190 10*3/uL (ref 150–400)
RBC: 4.29 MIL/uL (ref 4.22–5.81)
RDW: 15 % (ref 11.5–15.5)
WBC: 6.7 10*3/uL (ref 4.0–10.5)
nRBC: 0 % (ref 0.0–0.2)

## 2021-07-20 LAB — PHOSPHORUS: Phosphorus: 3.3 mg/dL (ref 2.5–4.6)

## 2021-07-20 LAB — CREATININE, URINE, RANDOM: Creatinine, Urine: 269.03 mg/dL

## 2021-07-20 LAB — LACTIC ACID, PLASMA
Lactic Acid, Venous: 0.8 mmol/L (ref 0.5–1.9)
Lactic Acid, Venous: 0.8 mmol/L (ref 0.5–1.9)

## 2021-07-20 LAB — OSMOLALITY, URINE: Osmolality, Ur: 596 mOsm/kg (ref 300–900)

## 2021-07-20 LAB — HIV ANTIBODY (ROUTINE TESTING W REFLEX): HIV Screen 4th Generation wRfx: NONREACTIVE

## 2021-07-20 LAB — CORTISOL: Cortisol, Plasma: 15.8 ug/dL

## 2021-07-20 LAB — GLUCOSE, CAPILLARY
Glucose-Capillary: 117 mg/dL — ABNORMAL HIGH (ref 70–99)
Glucose-Capillary: 122 mg/dL — ABNORMAL HIGH (ref 70–99)
Glucose-Capillary: 124 mg/dL — ABNORMAL HIGH (ref 70–99)
Glucose-Capillary: 124 mg/dL — ABNORMAL HIGH (ref 70–99)
Glucose-Capillary: 130 mg/dL — ABNORMAL HIGH (ref 70–99)
Glucose-Capillary: 140 mg/dL — ABNORMAL HIGH (ref 70–99)

## 2021-07-20 LAB — CORTISOL-AM, BLOOD: Cortisol - AM: 14.1 ug/dL (ref 6.7–22.6)

## 2021-07-20 LAB — FOLATE: Folate: 5.9 ng/mL — ABNORMAL LOW (ref >5.9)

## 2021-07-20 LAB — TSH: TSH: 1.705 u[IU]/mL (ref 0.350–4.500)

## 2021-07-20 LAB — HEMOGLOBIN A1C
Hgb A1c MFr Bld: 7.3 % — ABNORMAL HIGH (ref 4.8–5.6)
Mean Plasma Glucose: 162.81 mg/dL

## 2021-07-20 LAB — MAGNESIUM: Magnesium: 2.1 mg/dL (ref 1.7–2.4)

## 2021-07-20 LAB — VITAMIN B12: Vitamin B-12: 744 pg/mL (ref 180–914)

## 2021-07-20 LAB — PREALBUMIN: Prealbumin: 24.4 mg/dL (ref 18–38)

## 2021-07-20 LAB — CK: Total CK: 93 U/L (ref 49–397)

## 2021-07-20 LAB — OSMOLALITY: Osmolality: 310 mOsm/kg — ABNORMAL HIGH (ref 275–295)

## 2021-07-20 LAB — SODIUM, URINE, RANDOM: Sodium, Ur: 64 mmol/L

## 2021-07-20 MED ORDER — POTASSIUM CHLORIDE CRYS ER 20 MEQ PO TBCR
40.0000 meq | EXTENDED_RELEASE_TABLET | Freq: Once | ORAL | Status: AC
Start: 2021-07-20 — End: 2021-07-20
  Administered 2021-07-20: 40 meq via ORAL
  Filled 2021-07-20: qty 2

## 2021-07-20 MED ORDER — FOLIC ACID 1 MG PO TABS
1.0000 mg | ORAL_TABLET | Freq: Every day | ORAL | Status: DC
  Administered 2021-07-20 – 2021-07-21 (×2): 1 mg via ORAL
  Filled 2021-07-20 (×2): qty 1

## 2021-07-20 MED ORDER — LACTATED RINGERS IV SOLN: INTRAVENOUS | Status: DC

## 2021-07-20 NOTE — Progress Notes (Signed)
Pt refused CPAP qhs.  Pt encouraged to contact RT should he change his mind.   

## 2021-07-20 NOTE — Assessment & Plan Note (Addendum)
-   baseline creatinine ~ 1.1 - patient presents with increase in creat >0.3 mg/dL above baseline, creat increase >1.5x baseline presumed to have occurred within past 7 days PTA - creat 3.19 on admission - FeNa < 1% consistent with prerenal from decreased PO intake as patient reports - creat 1.42 on day of discharge; patient encouraged to continue adequate hydration upon discharge as well

## 2021-07-20 NOTE — Assessment & Plan Note (Addendum)
- "  Partially empty sella" per MRI brain on 07/19/21 - TSH normal - cortisol appropriate

## 2021-07-20 NOTE — Assessment & Plan Note (Signed)
-   Longstanding history of dysarthria.  Recent evaluation with neurology on 07/13/2021.  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

## 2021-07-20 NOTE — Assessment & Plan Note (Signed)
-   No longer requires Reglan

## 2021-07-20 NOTE — Progress Notes (Signed)
Progress Note    Micheal Gomez   NFA:[id number]  DOB: 05-May-1956  DOA: 07/19/2021     0 PCP: [facility]  Initial CC: decreased PO intake, weakness  Hospital Course: Micheal Gomez is a 66 yo male with PMH CVA, DMII, HLD, HTN who presented to the hospital generalized weakness, decreased appetite.  He was recently seen by neurology outpatient for longstanding dysarthria.  He was found to have a chronic left pontine stroke on MRI.  He was restarted on daily aspirin and also recommended for weight loss. He has been trying to eat smaller portions at home and appears that he has also been drinking less fluids. Creatinine on admission was elevated, 3.1 and he was started on IVF.   Interval History:  Resting in bed comfortably this morning.  We discussed trying to remain hydrated at home while he continues to decrease his portion sizes from eating in efforts to lose weight.  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  Recent evaluation with neurology on 07/13/2021.  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 - Patient recommended to continue baby aspirin daily - Outpatient follow-up with ENT and Speech therapy   Diabetic gastroparesis associated with type 2 diabetes mellitus ([place])- (present on admission) - No longer requires Reglan  Hypertension- (present on admission) - Controlled without  medications currently  Insulin-requiring or dependent type II diabetes mellitus ([place]) - A1c 7.3% on 07/19/21 - continue diet control and SSI  Severe obesity (BMI 35.0-39.9) with comorbidity ([place])- (present on admission) - Complicates overall prognosis and care - Body mass index is 37.47 kg/m. - Weight Loss and Dietary Counseling given  Obesity hypoventilation syndrome ([place])- (present on admission) - see OSA  OSA on CPAP - nightly CPAP if uses at home     Old records reviewed in assessment of this patient  Antimicrobials:   DVT prophylaxis: SCD  Code Status:   Code Status: Full Code  Disposition Plan:  Home Status is: Inpt  Objective: Blood pressure (!) 112/59, pulse 96, temperature 98.9 F (37.2 C), resp. rate 16, height 5\' 9"  (1.753 m), weight 115.1 kg, SpO2 91 %.  Examination:  Physical Exam Constitutional:      General: He is not in acute distress.    Appearance: Normal appearance.  HENT:     Head: Normocephalic and atraumatic.     Mouth/Throat:     Mouth: Mucous membranes are moist.  Eyes:     Extraocular Movements: Extraocular movements intact.  Cardiovascular:     Rate and Rhythm: Normal rate and regular rhythm.     Heart sounds: Normal heart sounds.  Pulmonary:     Effort: Pulmonary effort is normal. No respiratory distress.     Breath sounds: Normal breath sounds. No wheezing.  Abdominal:     General: Bowel sounds are normal. There is no distension.     Palpations: Abdomen is soft.  Tenderness: There is no abdominal tenderness.  Musculoskeletal:        General: Normal range of motion.     Cervical back: Normal range of motion and neck supple.  Skin:    General: Skin is warm and dry.  Neurological:     Mental Status: He is alert.     Comments: Mild dysarthria appreciated  Psychiatric:        Mood and Affect: Mood normal.        Behavior: Behavior normal.     Consultants:    Procedures:    Data Reviewed: I have personally reviewed  labs and imaging studies    LOS: 0 days  Time spent: Greater than 50% of the 35 minute visit was spent in counseling/coordination of care for the patient as laid out in the A&P.   Dwyane Dee, MD [facility] 07/20/2021, 1:51 PM

## 2021-07-20 NOTE — Progress Notes (Addendum)
Nutrition Note  RD consulted for "assess nutritional status".  Lab Results  Component Value Date   HGBA1C 7.3 (H) 07/19/2021   Patient has been taking Ozempic [facility], semaglutide) which decreases appetite. Pt states he has been eating much less, as well as not drinking enough fluids.   Reviewed consistent meal intakes with patient. Encouraged pt to not skip meals.   RD providing "Carbohydrate Counting for People with Diabetes" and "Plate Method" handout from the Academy of Nutrition and Dietetics. Discussed different food groups and their effects on blood sugar, emphasizing carbohydrate-containing foods. Provided list of carbohydrates and recommended serving sizes of common foods.  Discussed importance of controlled and consistent carbohydrate intake throughout the day. Provided examples of ways to balance meals/snacks and encouraged intake of high-fiber, whole grain complex carbohydrates. Teach back method used.  Expect good compliance.  Body mass index is 37.47 kg/m. Pt meets criteria for obesity based on current BMI.  Current diet order is CHO modified, patient is consuming approximately 100% of meals at this time. Labs and medications reviewed. No further nutrition interventions warranted at this time. If additional nutrition issues arise, please re-consult RD.  Clayton Bibles, MS, RD, LDN Inpatient Clinical Dietitian Contact information available via Amion

## 2021-07-20 NOTE — Assessment & Plan Note (Signed)
-   Controlled without medications currently

## 2021-07-20 NOTE — Assessment & Plan Note (Deleted)
-   nightly CPAP if uses at home

## 2021-07-20 NOTE — Assessment & Plan Note (Deleted)
-   see OSA

## 2021-07-20 NOTE — Assessment & Plan Note (Signed)
-   A1c 7.3% on 07/19/21 - continue diet control and SSI

## 2021-07-20 NOTE — Plan of Care (Signed)
°  Problem: Clinical Measurements: Goal: Diagnostic test results will improve Outcome: Progressing   Problem: Safety: Goal: Ability to remain free from injury will improve Outcome: Progressing   Problem: Nutritional: Goal: Ability to make healthy dietary choices will improve Outcome: Progressing   Problem: Health Behavior/Discharge Planning: Goal: Ability to manage health-related needs will improve Outcome: Progressing

## 2021-07-20 NOTE — Progress Notes (Signed)
SATURATION QUALIFICATIONS: (This note is used to comply with regulatory documentation for home oxygen)  Patient Saturations on Room Air at Rest = 94%  Patient Saturations on Room Air while Ambulating = 91%  Patient Saturations on 0 Liters of oxygen while Ambulating = 83% - (Not applicable as patient has been on RA).

## 2021-07-20 NOTE — Assessment & Plan Note (Signed)
-   Complicates overall prognosis and care - Body mass index is 37.47 kg/m. - Weight Loss and Dietary Counseling given

## 2021-07-20 NOTE — Hospital Course (Addendum)
Mr. Rallis is a 66 yo male with PMH CVA, DMII, HLD, HTN who presented to the hospital generalized weakness, decreased appetite.  He was recently seen by neurology outpatient for longstanding dysarthria.  He was found to have a chronic left pontine stroke on MRI.  He was restarted on daily aspirin and also recommended for weight loss. He has been trying to eat smaller portions at home and appears that he has also been drinking less fluids. Creatinine on admission was elevated, 3.1 and he was started on IVF.  Renal function responded well to fluids after admission and creatinine at time of discharge was 1.42.

## 2021-07-20 NOTE — Evaluation (Addendum)
Physical Therapy Evaluation Only Patient Details Name: Micheal Gomez MRN: [id number] DOB: [birth year] Today's Date: 07/20/2021  History of Present Illness  Patient is a 66 year old male admitted generalized weakness, poor appetite, poor sleep. ~1 week ago diagnosed with left pontine stroke after developing worsening slurred speech. PMH: DM2, HLD, HTN,  syphilis   Clinical Impression  Pt from home alone, 3 steps to enter, ind with self care and furniture walking around the home without falls. Pt reports limited community ambulation, uses grocery pick up or orders takeout. Pt reports some blurred vision since CVA, able to clear past obstacles and complete turns without difficulty. Pt reports consistent dizziness that doesn't worsen or improve with positional changes; BP WNL and SpO2 on RA 96%. Pt ambulates with cautious, flat foot gait pattern, no unsteadiness or physical assist required. Pt with girl friend and daughter locally. Recommend HHPT for subjective reports of functional decline, may benefit from home eval to assess size for RW or provide safety education on use of SPC versus furniture ambulation in home environment.     Recommendations for follow up therapy are one component of a multi-disciplinary discharge planning process, led by the attending physician.  Recommendations may be updated based on patient status, additional functional criteria and insurance authorization.  Follow Up Recommendations Home health PT    Assistance Recommended at Discharge PRN  Patient can return home with the following       Equipment Recommendations None recommended by PT  Recommendations for Other Services       Functional Status Assessment Patient has not had a recent decline in their functional status     Precautions / Restrictions Precautions Precautions: None Restrictions Weight Bearing Restrictions: No      Mobility  Bed Mobility Overal bed mobility: Modified Independent    Transfers Overall transfer level: Modified independent Equipment used: None   Ambulation/Gait Ambulation/Gait assistance: Supervision Gait Distance (Feet): 40 Feet Assistive device: Rolling walker (2 wheels) Gait Pattern/deviations: Step-to pattern, Decreased stride length Gait velocity: decreased  General Gait Details: flat foot/steppage-like gait, very rigid throughout trunk and extremities, reports "I'm being careful" while ambulating, completes turns and clears past obstacles at safe distance, decreased cadence  Stairs            Wheelchair Mobility    Modified Rankin (Stroke Patients Only)       Balance Overall balance assessment: Modified Independent       Pertinent Vitals/Pain Pain Assessment Pain Assessment: No/denies pain    Home Living Family/patient expects to be discharged to:: Private residence Living Arrangements: Alone Available Help at Discharge: Family;Available PRN/intermittently Type of Home: House Home Access: Stairs to enter Entrance Stairs-Rails: Psychiatric nurse of Steps: 3   Home Layout: One level Home Equipment: Cane - single point      Prior Function Prior Level of Function : Independent/Modified Independent  Mobility Comments: Pt uses cane or furniture walks in the house, states not wide enough to fit walker ADLs Comments: Pt reports ind with ADLs/IADLs, takes breaks as needed     Hand Dominance   Dominant Hand: Right    Extremity/Trunk Assessment   Upper Extremity Assessment Upper Extremity Assessment: Defer to OT evaluation    Lower Extremity Assessment Lower Extremity Assessment: Overall WFL for tasks assessed (AROM WNL, strength 4/5, symmetrical BLE, denies numbness/tingling)    Cervical / Trunk Assessment Cervical / Trunk Assessment: Normal  Communication   Communication: Expressive difficulties (mumbled speech)  Cognition Arousal/Alertness: Awake/alert Behavior During Therapy: [facility]  for tasks  assessed/performed Overall Cognitive Status: Within Functional Limits for tasks assessed     General Comments General comments (skin integrity, edema, etc.): BP stable, O2 at 96% on room air, HR up to 125 with activity    Exercises     Assessment/Plan    PT Assessment All further PT needs can be met in the next venue of care  PT Problem List Decreased activity tolerance;Decreased knowledge of use of DME       PT Treatment Interventions      PT Goals (Current goals can be found in the Care Plan section)  Acute Rehab PT Goals Patient Stated Goal: return home PT Goal Formulation: All assessment and education complete, DC therapy    Frequency       Co-evaluation PT/OT/SLP Co-Evaluation/Treatment: Yes Reason for Co-Treatment: To address functional/ADL transfers PT goals addressed during session: Mobility/safety with mobility;Balance;Proper use of DME OT goals addressed during session: ADL's and self-care       AM-PAC PT "6 Clicks" Mobility  Outcome Measure Help needed turning from your back to your side while in a flat bed without using bedrails?: None Help needed moving from lying on your back to sitting on the side of a flat bed without using bedrails?: None Help needed moving to and from a bed to a chair (including a wheelchair)?: None Help needed standing up from a chair using your arms (e.g., wheelchair or bedside chair)?: None Help needed to walk in hospital room?: A Little Help needed climbing 3-5 steps with a railing? : A Little 6 Click Score: 22    End of Session Equipment Utilized During Treatment: Gait belt Activity Tolerance: Patient tolerated treatment well Patient left: in chair;with call bell/phone within reach Nurse Communication: Mobility status PT Visit Diagnosis: Other abnormalities of gait and mobility (R26.89)    Time: 1141-1200 PT Time Calculation (min) (ACUTE ONLY): 19 min   Charges:   PT Evaluation $PT Eval Low Complexity: 1 Low            Tori Vong Garringer PT, DPT 07/20/21, 3:07 PM

## 2021-07-20 NOTE — Evaluation (Signed)
Occupational Therapy Evaluation Patient Details Name: Micheal Gomez MRN: [id number] DOB: 13-Jul-1955 Today's Date: 07/20/2021   History of Present Illness Patient is a 66 year old male admitted generalized weakness, poor appetite, poor sleep. ~1 week ago diagnosed with left pontine stroke after developing worsening slurred speech. PMH: DM2, HLD, HTN,  syphilis   Clinical Impression   Patient lives in  a single level house he rents with 3 steps to enter and is independent with self care at baseline. Has a cane but typically furniture walks while in the house as it is too narrow to use a walker. Patient states he picks up prepared food from restaurants vs grocery shopping. Patient reports some blurred vision since CVA, was able to accurately read with both near and far vision as well as navigate obstacles safety. Encourage patient to follow up with opthamologist. Patient did not need assistance with self care tasks lower body dressing, functional transfers and ambulation. With mobility patient did report dizziness that stayed consistent throughout BP remained stable however HR up to 125 with ambulation. At this time no acute OT needs, will sign off. Patient would benefit from tub transfer bench to safety get in/out of tub as he has been sponge bathing for past week or so because he did not trust himself to get in/out.      Recommendations for follow up therapy are one component of a multi-disciplinary discharge planning process, led by the attending physician.  Recommendations may be updated based on patient status, additional functional criteria and insurance authorization.   Follow Up Recommendations  No OT follow up    Assistance Recommended at Discharge PRN     Functional Status Assessment  Patient has not had a recent decline in their functional status  Equipment Recommendations  Tub/shower bench       Precautions / Restrictions Restrictions Weight Bearing Restrictions: No       Mobility Bed Mobility Overal bed mobility: Modified Independent                      Balance Overall balance assessment: Modified Independent                                         ADL either performed or assessed with clinical judgement   ADL Overall ADL's : Modified independent                                       General ADL Comments: Patient demonstrate ability to perform ADL tasks of lower body dressing, functional transfers and ambulation with rolling walker. Was tentative with walking taking his time and does report mild dizziness. BP stable with HR up to 125 with activity. Note patient cannot fit walker in his home but furniture walks instead.     Vision Baseline Vision/History: 1 Wears glasses Ability to See in Adequate Light: 0 Adequate Patient Visual Report: Blurring of vision Additional Comments: Patient reporting some blurred vision, did basic assessment of near and far sight reading of signs in room and did so without difficulty. Wears glasses for distance and reports has been ~1 year since he has had an eye exam. Encourage patient to follow up with ophthamologist            Pertinent Vitals/Pain Pain Assessment Pain Assessment:  No/denies pain     Hand Dominance Right   Extremity/Trunk Assessment Upper Extremity Assessment Upper Extremity Assessment: Overall WFL for tasks assessed   Lower Extremity Assessment Lower Extremity Assessment: Defer to PT evaluation   Cervical / Trunk Assessment Cervical / Trunk Assessment: Normal   Communication Communication Communication: Expressive difficulties (mumbled speech)   Cognition Arousal/Alertness: Awake/alert Behavior During Therapy: WFL for tasks assessed/performed Overall Cognitive Status: Within Functional Limits for tasks assessed                                       General Comments  BP stable, O2 at 96% on room air, HR up to 125 with  activity            Home Living Family/patient expects to be discharged to:: Private residence Living Arrangements: Alone Available Help at Discharge: Family;Available PRN/intermittently Type of Home: House Home Access: Stairs to enter [employer] of Steps: 3 Entrance Stairs-Rails: Right;Left Home Layout: One level     Bathroom Shower/Tub: Teacher, early years/pre: Standard     Home Equipment: Cane - single point          Prior Functioning/Environment Prior Level of Function : Independent/Modified Independent             Mobility Comments: Patient uses cane or furniture walks in the house, states not wide enough to fit walker          OT Problem List: Decreased activity tolerance         OT Goals(Current goals can be found in the care plan section) Acute Rehab OT Goals Patient Stated Goal: Home tomorrow OT Goal Formulation: All assessment and education complete, DC therapy      Co-evaluation PT/OT/SLP Co-Evaluation/Treatment: Yes Reason for Co-Treatment: To address functional/ADL transfers PT goals addressed during session: Mobility/safety with mobility OT goals addressed during session: ADL's and self-care      AM-PAC OT "6 Clicks" Daily Activity     Outcome Measure Help from another person eating meals?: None Help from another person taking care of personal grooming?: None Help from another person toileting, which includes using toliet, bedpan, or urinal?: None Help from another person bathing (including washing, rinsing, drying)?: None Help from another person to put on and taking off regular upper body clothing?: None Help from another person to put on and taking off regular lower body clothing?: None 6 Click Score: 24   End of Session Equipment Utilized During Treatment: Rolling walker (2 wheels) Nurse Communication: Mobility status  Activity Tolerance: Patient tolerated treatment well Patient left: in chair;with call  bell/phone within reach  OT Visit Diagnosis: Other abnormalities of gait and mobility (R26.89)                Time: 1140-1158 OT Time Calculation (min): 18 min Charges:  OT General Charges $OT Visit: 1 Visit OT Evaluation $OT Eval Low Complexity: 1 Low  Delbert Phenix OT OT pager: [place] 07/20/2021, 1:23 PM

## 2021-07-21 LAB — BASIC METABOLIC PANEL
Anion gap: 6 (ref 5–15)
BUN: 27 mg/dL — ABNORMAL HIGH (ref 8–23)
CO2: 23 mmol/L (ref 22–32)
Calcium: 8.7 mg/dL — ABNORMAL LOW (ref 8.9–10.3)
Chloride: 107 mmol/L (ref 98–111)
Creatinine, Ser: 1.42 mg/dL — ABNORMAL HIGH (ref 0.61–1.24)
GFR, Estimated: 54 mL/min — ABNORMAL LOW (ref >60)
Glucose, Bld: 134 mg/dL — ABNORMAL HIGH (ref 70–99)
Potassium: 3.8 mmol/L (ref 3.5–5.1)
Sodium: 136 mmol/L (ref 135–145)

## 2021-07-21 LAB — GLUCOSE, CAPILLARY
Glucose-Capillary: 134 mg/dL — ABNORMAL HIGH (ref 70–99)
Glucose-Capillary: 100 mg/dL — ABNORMAL HIGH (ref 70–99)
Glucose-Capillary: 127 mg/dL — ABNORMAL HIGH (ref 70–99)

## 2021-07-21 LAB — MAGNESIUM: Magnesium: 1.9 mg/dL (ref 1.7–2.4)

## 2021-07-21 MED ORDER — MELATONIN 3 MG PO TABS
3.0000 mg | ORAL_TABLET | Freq: Once | ORAL | Status: AC
  Administered 2021-07-21: 3 mg via ORAL
  Filled 2021-07-21: qty 1

## 2021-07-21 NOTE — TOC Initial Note (Signed)
Transition of Care [facility]) - Initial/Assessment Note    Patient Details  Name: Micheal Gomez MRN: [id number] Date of Birth: [birth year]  [facility] CM/SW Contact:    Lynnell Catalan, RN Phone Number: 07/21/2021, 12:42 PM  Clinical Narrative:                 Spoke with pt at bedside for dc planning. Choice offered for home health physical therapy. Pt states that he doesn't need physical therapy at home. He did ask how he could get his girlfriend compensated for being his caregiver. He was advised to check with the [place] about that. He was asked if he needed a RW and he says he only wants to use his cane and he does not want a BSC. TOC will sign off.  Expected Discharge Plan: [place] Barriers to Discharge: No Barriers Identified   Patient Goals and CMS Choice Patient states their goals for this hospitalization and ongoing recovery are:: To go home      Expected Discharge Plan and Services Expected Discharge Plan: [place]   Discharge Planning Services: CM Consult Post Acute Care Choice: [place] arrangements for the past 2 months: Single Family Home Expected Discharge Date: 07/21/21                                    Prior Living Arrangements/Services Living arrangements for the past 2 months: Single Family Home Lives with:: Self          Need for Family Participation in Patient Care: Yes (Comment) Care giver support system in place?: Yes (comment)   Criminal Activity/Legal Involvement Pertinent to Current Situation/Hospitalization: No - Comment as needed  Activities of Daily Living Home Assistive Devices/Equipment: CPAP ADL Screening (condition at time of admission) Patient's cognitive ability adequate to safely complete daily activities?: Yes Is the patient deaf or have difficulty hearing?: No Does the patient have difficulty seeing, even when wearing glasses/contacts?: No Does the patient have  difficulty concentrating, remembering, or making decisions?: No Patient able to express need for assistance with ADLs?: Yes Does the patient have difficulty dressing or bathing?: No Independently performs ADLs?: Yes (appropriate for developmental age) Does the patient have difficulty walking or climbing stairs?: No Weakness of Legs: Both Weakness of Arms/Hands: None  Permission Sought/Granted                  Emotional Assessment Appearance:: Appears stated age Attitude/Demeanor/Rapport: Gracious Affect (typically observed): Calm Orientation: : Oriented to Self, Oriented to Place, Oriented to  Time, Oriented to Situation Alcohol / Substance Use: Not Applicable Psych Involvement: No (comment)  Admission diagnosis:  Dehydration [E86.0] Dizziness [R42] Gait abnormality [R26.9] Dysarthria [R47.1] AKI (acute kidney injury) ([place]) [N17.9] Left pontine stroke [facility]) [I63.9] Patient Active Problem List   Diagnosis Date Noted   AKI (acute kidney injury) ([place]) 07/19/2021   History of CVA with residual deficit 07/19/2021   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[place]) 07/19/2021   Dehydration 07/19/2021   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, terrorism, or hostility 07/13/2021   Tendinitis of shoulder 07/13/2021   Shoulder pain 07/13/2021   Primary osteoarthritis, left shoulder 03/22/2021   Diabetic gastroparesis associated with type 2 diabetes mellitus ([place]) 01/17/2021   Severe obesity (BMI 35.0-39.9) with comorbidity ([place]) 01/13/2021   Insulin-requiring or dependent type II diabetes mellitus ([place]) 01/13/2021   High cholesterol 01/13/2021  Hypertension 01/13/2021   History of stomach ulcers 01/13/2021   CKD (chronic kidney disease) stage 2, GFR 60-89 ml/min 01/13/2021   GERD (gastroesophageal reflux disease) 01/13/2021   Colon mass s/p robotic proximal colectomy 01/11/2021 01/11/2021   Shoulder subluxation, left 12/16/2020   Transaminitis    Hyperglycemia    Non-traumatic rhabdomyolysis     OSA on CPAP    Obesity hypoventilation syndrome ([place])    Atypical chest pain 08/26/2013   PCP:  Clinic, [place]:   [place], [place] - [place] [address] [place] [address] Phone: [phone] Fax: [phone]  [id number] - [place], [address] [place] [address] Phone: [phone] Fax: [phone]     Social Determinants of Health ([place]) Interventions    Readmission Risk Interventions Readmission Risk Prevention Plan 07/21/2021  Transportation Screening Complete  PCP or Specialist Appt within 5-7 Days Complete  Home Care Screening Complete  Medication Review (RN CM) Complete  Some recent data might be hidden

## 2021-07-21 NOTE — Progress Notes (Signed)
0700-15:30 Patient discharged home, VSS stable upon discharge with all belongings. Discharge paperwork and summary went over with patient. IV taken out and patient wheeled to lobby.

## 2021-07-21 NOTE — Discharge Summary (Signed)
Physician Discharge Summary   Micheal Gomez NFA:[id number] DOB: 1955/10/08 DOA: 07/19/2021  PCP: [facility]  Admit date: 07/19/2021 Discharge date:  07/21/2021  Admitted From: Home Disposition:  Home with [facility] Discharging physician: Dwyane Dee, MD  Recommendations for Outpatient Follow-up:  Continue routine care    Discharge Condition: stable CODE STATUS: Full Diet recommendation:  Diet Orders (From admission, onward)     Start     Ordered   07/21/21 0000  Diet Carb Modified        07/21/21 1123   07/19/21 2335  Diet Carb Modified Fluid consistency: Thin; Room service appropriate? Yes  Diet effective now       Question Answer Comment  Diet-HS Snack? Nothing   Calorie Level Medium 1600-2000   Fluid consistency: Thin   Room service appropriate? Yes      07/19/21 2334            Hospital Course: Mr. [name] is a 66 yo male with PMH CVA, DMII, HLD, HTN who presented to the hospital generalized weakness, decreased appetite.  He was recently seen by neurology outpatient for longstanding dysarthria.  He was found to have a chronic left pontine stroke on MRI.  He was restarted on daily aspirin and also recommended for weight loss. He has been trying to eat smaller portions at home and appears that he has also been drinking less fluids. Creatinine on admission was elevated, 3.1 and he was started on IVF.  Renal function responded well to fluids after admission and creatinine at time of discharge was 1.42.  * AKI (acute kidney injury) ([place])- (present on admission) - baseline creatinine ~ 1.1 - patient presents with increase in creat >0.3 mg/dL above baseline, creat increase >1.5x baseline presumed to have occurred within past 7 days PTA - creat 3.19 on admission - FeNa < 1% consistent with prerenal from decreased PO intake as patient reports - creat 1.42 on day of discharge; patient encouraged to continue adequate hydration upon discharge as well  Empty sella  turcica ([place])- (present on admission) - "Partially empty sella" per MRI brain on 07/19/21 - TSH normal - cortisol appropriate   History of CVA with residual deficit - Longstanding history of dysarthria.  Recent evaluation with neurology on 07/13/2021.  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 - Patient recommended to continue baby aspirin daily - Outpatient follow-up with ENT and Speech therapy   Diabetic gastroparesis associated with type 2 diabetes mellitus ([place])- (present on admission) - No longer requires Reglan  Hypertension- (present on admission) - Controlled without medications currently  Insulin-requiring or dependent type II diabetes mellitus ([place]) - A1c 7.3% on 07/19/21 - continue diet control and SSI  Severe obesity (BMI 35.0-39.9) with comorbidity ([place])- (present on admission) - Complicates overall prognosis and care - Body mass index is 37.47 kg/m. - Weight Loss and Dietary Counseling given    The patient's chronic medical conditions were treated accordingly per the patient's home medication regimen except as noted.  On day of discharge, patient was felt deemed stable for discharge. Patient/family member advised to call PCP or come back to ER if needed.   Principal Diagnosis: AKI (acute kidney injury) [facility])  Discharge Diagnoses: Principal Problem:   AKI (acute kidney injury) ([place]) Active Problems:   OSA on CPAP   Obesity hypoventilation syndrome (H[facility])   Severe obesity (BMI 35.0-39.9) with comorbidity ([place])   Insulin-requiring or dependent type II diabetes mellitus ([place])   Hypertension   Diabetic gastroparesis associated with  type 2 diabetes mellitus ([place])   History of CVA with residual deficit   Empty sella turcica [facility])   Discharge Instructions     Diet Carb Modified   Complete by: As directed    Increase activity slowly   Complete by: As directed       Allergies as of 07/21/2021   No Known Allergies      Medication List     STOP  taking these medications    bismuth subsalicylate 166 AY/30ZS suspension Commonly known as: PEPTO BISMOL   hydrochlorothiazide 25 MG tablet Commonly known as: HYDRODIURIL   HYDROcodone-acetaminophen 5-325 MG tablet Commonly known as: NORCO/VICODIN       TAKE these medications    amLODipine 10 MG tablet Commonly known as: NORVASC Take 1 tablet (10 mg total) by mouth daily.   aspirin EC 81 MG tablet Take 81 mg by mouth daily. Swallow whole.   atorvastatin 20 MG tablet Commonly known as: LIPITOR Take 20 mg by mouth daily.   blood glucose meter kit and supplies Dispense based on patient and insurance preference. Use up to four times daily as directed. (FOR ICD-9 250.00, 250.01).   doxepin 50 MG capsule Commonly known as: SINEQUAN Take 50 mg by mouth at bedtime.   glucose blood test strip Commonly known as: FREESTYLE LITE Use as instructed   insulin aspart 100 UNIT/ML injection Commonly known as: novoLOG Inject 30 Units into the skin 2 (two) times daily before a meal.   insulin starter kit- pen needles Misc 1 kit by Other route once.   INSULIN SYRINGE .5CC/28G 28G X 1/2" 0.5 ML Misc 1 application by Does not apply route daily.   metFORMIN 500 MG 24 hr tablet Commonly known as: GLUCOPHAGE-XR Take 1,000 mg by mouth 2 (two) times daily.   omeprazole 20 MG capsule Commonly known as: PRILOSEC Take 20 mg by mouth daily.   OVER THE COUNTER MEDICATION Take 2 capsules by mouth See admin instructions. Instaflex capsules: Take 2 capsules by mouth every 3 months   Ozempic (1 MG/DOSE) 2 MG/1.5ML Sopn Generic drug: Semaglutide (1 MG/DOSE) Inject 1 mg into the skin every Saturday.        Follow-up Information     Clinic, [place].   Contact information: [place] [address] [phone]                No Known Allergies  Consultations:   Discharge Exam: BP 131/65 (BP Location: Right Arm)    Pulse 92    Temp  97.8 F (36.6 C) (Oral)    Resp 16    Ht $R'5\' 9"'IA$  (1.753 m)    Wt 115.1 kg    SpO2 93%    BMI 37.47 kg/m  Physical Exam Constitutional:      General: He is not in acute distress.    Appearance: Normal appearance.  HENT:     Head: Normocephalic and atraumatic.     Mouth/Throat:     Mouth: Mucous membranes are moist.  Eyes:     Extraocular Movements: Extraocular movements intact.  Cardiovascular:     Rate and Rhythm: Normal rate and regular rhythm.     Heart sounds: Normal heart sounds.  Pulmonary:     Effort: Pulmonary effort is normal. No respiratory distress.     Breath sounds: Normal breath sounds. No wheezing.  Abdominal:     General: Bowel sounds are normal. There is no distension.     Palpations: Abdomen is soft.  Tenderness: There is no abdominal tenderness.  Musculoskeletal:        General: Normal range of motion.     Cervical back: Normal range of motion and neck supple.  Skin:    General: Skin is warm and dry.  Neurological:     Mental Status: He is alert.     Comments: Mild dysarthria appreciated  Psychiatric:        Mood and Affect: Mood normal.        Behavior: Behavior normal.     The results of significant diagnostics from this hospitalization (including imaging, microbiology, ancillary and laboratory) are listed below for reference.   Microbiology: Recent Results (from the past 240 hour(s))  Resp Panel by RT-PCR (Flu A&B, Covid) Nasopharyngeal Swab     Status: None   Collection Time: 07/19/21  8:24 PM   Specimen: Nasopharyngeal Swab; Nasopharyngeal(NP) swabs in vial transport medium  Result Value Ref Range Status   SARS Coronavirus 2 by RT PCR NEGATIVE NEGATIVE Final    Comment: (NOTE) SARS-CoV-2 target nucleic acids are NOT DETECTED.  The SARS-CoV-2 RNA is generally detectable in upper respiratory specimens during the acute phase of infection. The lowest concentration of SARS-CoV-2 viral copies this assay can detect is 138 copies/mL. A negative result  does not preclude SARS-Cov-2 infection and should not be used as the sole basis for treatment or other patient management decisions. A negative result may occur with  improper specimen collection/handling, submission of specimen other than nasopharyngeal swab, presence of viral mutation(s) within the areas targeted by this assay, and inadequate number of viral copies(<138 copies/mL). A negative result must be combined with clinical observations, patient history, and epidemiological information. The expected result is Negative.  Fact Sheet for Patients:  EntrepreneurPulse.com.au  Fact Sheet for Healthcare Providers:  IncredibleEmployment.be  This test is no t yet approved or cleared by the [place] FDA and  has been authorized for detection and/or diagnosis of SARS-CoV-2 by FDA under an Emergency Use Authorization (EUA). This EUA will remain  in effect (meaning this test can be used) for the duration of the COVID-19 declaration under Section 564(b)(1) of the Act, 21 U.S.C.section 360bbb-3(b)(1), unless the authorization is terminated  or revoked sooner.       Influenza A by PCR NEGATIVE NEGATIVE Final   Influenza B by PCR NEGATIVE NEGATIVE Final    Comment: (NOTE) The Xpert Xpress SARS-CoV-2/FLU/RSV plus assay is intended as an aid in the diagnosis of influenza from Nasopharyngeal swab specimens and should not be used as a sole basis for treatment. Nasal washings and aspirates are unacceptable for Xpert Xpress SARS-CoV-2/FLU/RSV testing.  Fact Sheet for Patients: EntrepreneurPulse.com.au  Fact Sheet for Healthcare Providers: IncredibleEmployment.be  This test is not yet approved or cleared by the [place] FDA and has been authorized for detection and/or diagnosis of SARS-CoV-2 by FDA under an Emergency Use Authorization (EUA). This EUA will remain in effect (meaning this test can be used) for  the duration of the COVID-19 declaration under Section 564(b)(1) of the Act, 21 U.S.C. section 360bbb-3(b)(1), unless the authorization is terminated or revoked.  Performed at [facility], [place] [address], [place], [place] [address]      Labs: BNP (last 3 results) No results for input(s): BNP in the last 8760 hours. Basic Metabolic Panel: Recent Labs  Lab 07/19/21 1649 07/20/21 0336 07/21/21 0459  NA 136 135 136  K 3.4* 3.3* 3.8  CL 105 105 107  CO2 22 22 23  GLUCOSE 101* 129* 134*  BUN 57* 49* 27*  CREATININE 3.19* 2.44* 1.42*  CALCIUM 9.1 8.7* 8.7*  MG 2.4 2.1 1.9  PHOS 4.2 3.3  --    Liver Function Tests: Recent Labs  Lab 07/19/21 1649 07/20/21 0336  AST 16   17 14*  ALT $Re'14   15 14  'SyW$ ALKPHOS 98   113 97  BILITOT 0.6   0.7 0.5  PROT 6.8   8.2* 6.7  ALBUMIN 3.4*   4.1 3.2*   No results for input(s): LIPASE, AMYLASE in the last 168 hours. No results for input(s): AMMONIA in the last 168 hours. CBC: Recent Labs  Lab 07/19/21 1649 07/20/21 0336  WBC 6.8 6.7  NEUTROABS 4.2 4.0  HGB 14.1 11.8*  HCT 43.8 35.8*  MCV 83.9 83.4  PLT 240 190   Cardiac Enzymes: Recent Labs  Lab 07/20/21 0039  CKTOTAL 93   BNP: Invalid input(s): POCBNP CBG: Recent Labs  Lab 07/20/21 2012 07/20/21 2352 07/21/21 0406 07/21/21 0753 07/21/21 1138  GLUCAP 130* 140* 134* 100* 127*   D-Dimer No results for input(s): DDIMER in the last 72 hours. Hgb A1c Recent Labs    07/19/21 1649  HGBA1C 7.3*   Lipid Profile No results for input(s): CHOL, HDL, LDLCALC, TRIG, CHOLHDL, LDLDIRECT in the last 72 hours. Thyroid function studies Recent Labs    07/20/21 0336  TSH 1.705   Anemia work up Recent Labs    07/20/21 0029 07/20/21 0336  VITAMINB12 744  --   FOLATE  --  5.9*   Urinalysis    Component Value Date/Time   COLORURINE YELLOW 07/20/2021 0107   APPEARANCEUR CLEAR 07/20/2021 0107   LABSPEC 1.018 07/20/2021 0107   PHURINE 5.0 07/20/2021  0107   GLUCOSEU NEGATIVE 07/20/2021 0107   HGBUR NEGATIVE 07/20/2021 0107   BILIRUBINUR NEGATIVE 07/20/2021 0107   KETONESUR NEGATIVE 07/20/2021 0107   PROTEINUR NEGATIVE 07/20/2021 0107   NITRITE NEGATIVE 07/20/2021 0107   LEUKOCYTESUR NEGATIVE 07/20/2021 0107   Sepsis Labs Invalid input(s): PROCALCITONIN,  WBC,  LACTICIDVEN Microbiology Recent Results (from the past 240 hour(s))  Resp Panel by RT-PCR (Flu A&B, Covid) Nasopharyngeal Swab     Status: None   Collection Time: 07/19/21  8:24 PM   Specimen: Nasopharyngeal Swab; Nasopharyngeal(NP) swabs in vial transport medium  Result Value Ref Range Status   SARS Coronavirus 2 by RT PCR NEGATIVE NEGATIVE Final    Comment: (NOTE) SARS-CoV-2 target nucleic acids are NOT DETECTED.  The SARS-CoV-2 RNA is generally detectable in upper respiratory specimens during the acute phase of infection. The lowest concentration of SARS-CoV-2 viral copies this assay can detect is 138 copies/mL. A negative result does not preclude SARS-Cov-2 infection and should not be used as the sole basis for treatment or other patient management decisions. A negative result may occur with  improper specimen collection/handling, submission of specimen other than nasopharyngeal swab, presence of viral mutation(s) within the areas targeted by this assay, and inadequate number of viral copies(<138 copies/mL). A negative result must be combined with clinical observations, patient history, and epidemiological information. The expected result is Negative.  Fact Sheet for Patients:  EntrepreneurPulse.com.au  Fact Sheet for Healthcare Providers:  IncredibleEmployment.be  This test is no t yet approved or cleared by the [place] FDA and  has been authorized for detection and/or diagnosis of SARS-CoV-2 by FDA under an Emergency Use Authorization (EUA). This EUA will remain  in effect (meaning this test can be used) for the  duration  of the COVID-19 declaration under Section 564(b)(1) of the Act, 21 U.S.C.section 360bbb-3(b)(1), unless the authorization is terminated  or revoked sooner.       Influenza A by PCR NEGATIVE NEGATIVE Final   Influenza B by PCR NEGATIVE NEGATIVE Final    Comment: (NOTE) The Xpert Xpress SARS-CoV-2/FLU/RSV plus assay is intended as an aid in the diagnosis of influenza from Nasopharyngeal swab specimens and should not be used as a sole basis for treatment. Nasal washings and aspirates are unacceptable for Xpert Xpress SARS-CoV-2/FLU/RSV testing.  Fact Sheet for Patients: EntrepreneurPulse.com.au  Fact Sheet for Healthcare Providers: IncredibleEmployment.be  This test is not yet approved or cleared by the [place] FDA and has been authorized for detection and/or diagnosis of SARS-CoV-2 by FDA under an Emergency Use Authorization (EUA). This EUA will remain in effect (meaning this test can be used) for the duration of the COVID-19 declaration under Section 564(b)(1) of the Act, 21 U.S.C. section 360bbb-3(b)(1), unless the authorization is terminated or revoked.  Performed at [facility], [place] [address] [place], [place] [address]     Procedures/Studies: DG Abdomen 1 View  Result Date: 07/19/2021 CLINICAL DATA:  Decreased appetite. EXAM: ABDOMEN - 1 VIEW; PORTABLE CHEST - 1 VIEW COMPARISON:  06/05/2021, 01/16/2021. FINDINGS: The heart and mediastinal contour are within normal limits. Lung volumes are low. No consolidation, effusion, or pneumothorax. No acute osseous abnormality. The bowel gas pattern is normal. Suture material is noted in the right lower quadrant. No radio-opaque calculi or other significant radiographic abnormality are seen. IMPRESSION: Negative. Electronically Signed   By: Brett Fairy M.D.   On: 07/19/2021 21:16   CT HEAD WO CONTRAST (5MM)  Result Date: 07/19/2021 CLINICAL DATA:  Acute neuro  deficit. EXAM: CT HEAD WITHOUT CONTRAST TECHNIQUE: Contiguous axial images were obtained from the base of the skull through the vertex without intravenous contrast. RADIATION DOSE REDUCTION: This exam was performed according to the departmental dose-optimization program which includes automated exposure control, adjustment of the mA and/or kV according to patient size and/or use of iterative reconstruction technique. COMPARISON:  CT head 07/11/2021.  MRI 07/11/2021 FINDINGS: Brain: No evidence of acute infarction, hemorrhage, hydrocephalus, extra-axial collection or mass lesion/mass effect. Mild white matter hypodensity bilaterally similar to prior studies. Vascular: Negative for hyperdense vessel Skull: Negative Sinuses/Orbits: Negative Other: None IMPRESSION: No acute abnormality. Chronic white matter changes consistent with microvascular ischemia. Electronically Signed   By: Franchot Gallo M.D.   On: 07/19/2021 17:12   CT HEAD WO CONTRAST (5MM)  Result Date: 07/11/2021 CLINICAL DATA:  Neurological deficit.  Slurred speech. EXAM: CT HEAD WITHOUT CONTRAST TECHNIQUE: Contiguous axial images were obtained from the base of the skull through the vertex without intravenous contrast. COMPARISON:  None. FINDINGS: Brain: No evidence of acute infarction, hemorrhage, hydrocephalus, extra-axial collection or mass lesion/mass effect. Periventricular white matter low attenuation as can be seen with microvascular disease. Vascular: No hyperdense vessel or unexpected calcification. Skull: No osseous abnormality. Sinuses/Orbits: Mild left sphenoid sinus mucosal thickening. Visualized mastoid sinuses are clear. Visualized orbits demonstrate no focal abnormality. Other: None IMPRESSION: No acute intracranial pathology. Electronically Signed   By: Kathreen Devoid M.D.   On: 07/11/2021 12:40   MR ANGIO HEAD WO CONTRAST  Result Date: 07/19/2021 CLINICAL DATA:  Initial evaluation for neuro deficit, stroke suspected. EXAM: MRI HEAD  WITHOUT CONTRAST MRA HEAD WITHOUT CONTRAST MRA NECK WITHOUT AND WITH CONTRAST TECHNIQUE: Multiplanar, multi-echo pulse sequences of the brain and surrounding structures were acquired without intravenous  contrast. Angiographic images of the Circle of Willis were acquired using MRA technique without intravenous contrast. Angiographic images of the neck were acquired using MRA technique without and with intravenous contrast. Carotid stenosis measurements (when applicable) are obtained utilizing NASCET criteria, using the distal internal carotid diameter as the denominator. CONTRAST:  See injection documentation. COMPARISON:  Prior CT from earlier the same day as well as recent MRI from 07/11/2021. FINDINGS: MRI HEAD FINDINGS Brain: Cerebral volume within normal limits. Patchy T2/FLAIR hyperintensity involving the periventricular deep white matter both cerebral hemispheres most consistent with chronic small vessel ischemic disease, moderate in nature. Small remote left pontine lacunar infarct again noted. No abnormal foci of restricted diffusion to suggest acute or subacute ischemia. Gray-white matter differentiation maintained. No areas of chronic cortical infarction. No acute or chronic intracranial blood products. No mass lesion or midline shift. No hydrocephalus or extra-axial fluid collection. Partially empty sella noted. Midline structures intact. Vascular: Major intracranial vascular flow voids are maintained. Skull and upper cervical spine: Craniocervical junction within normal limits. Bone marrow signal intensity diffusely decreased on T1 weighted sequence, nonspecific, but most commonly related to anemia, smoking or obesity. No focal marrow replacing lesion. No scalp soft tissue abnormality. Sinuses/Orbits: Bilateral axial myopia noted. Globes and orbital soft tissues demonstrate no acute finding. Scattered mucosal thickening noted about the ethmoidal air cells. Few small maxillary sinus retention cyst noted.  No mastoid effusion. Other: None. MRA HEAD FINDINGS Anterior circulation: Visualized distal cervical segments of the internal carotid arteries widely patent with antegrade flow. Petrous, cavernous, and supraclinoid segments patent without stenosis or other abnormality. A1 segments patent. Normal anterior communicating complex. Anterior cerebral arteries patent without stenosis. No M1 stenosis or occlusion. Normal MCA bifurcations. Distal MCA branches well perfused and symmetric. Posterior circulation: Vertebral arteries largely code dominant and patent to the vertebrobasilar junction without stenosis. Both PICA origins patent and normal. Basilar patent to its distal aspect without stenosis. Superior cerebellar arteries patent bilaterally. Both PCAs primarily supplied via the basilar. Prominent left posterior communicating artery noted. Both PCAs well perfused to their distal aspects without stenosis. Anatomic variants: None significant. No aneurysm. MRA NECK FINDINGS Aortic arch: Visualized aortic arch normal in caliber. Bovine branching pattern noted. No stenosis about the origin of the great vessels. Right carotid system: Right common and internal carotid arteries patent without stenosis or evidence for dissection. No significant atheromatous narrowing about the right carotid bulb. Left carotid system: Left common and internal carotid arteries patent without stenosis or evidence for dissection. No significant atheromatous narrowing about the left carotid bulb. Vertebral arteries: Both vertebral arteries arise from subclavian arteries. No significant proximal subclavian artery stenosis. Vertebral arteries patent without stenosis or evidence for dissection. Other: None IMPRESSION: MRI HEAD: 1. No acute intracranial abnormality. 2. Moderate chronic microvascular ischemic disease, with small remote left pontine lacunar infarct. 3. Partially empty sella. MRA HEAD: Normal intracranial MRA. No large vessel occlusion,  hemodynamically significant stenosis, or other acute vascular abnormality. MRA NECK: Normal MRA of the neck. Electronically Signed   By: Jeannine Boga M.D.   On: 07/19/2021 20:01   MR Angiogram Neck W or Wo Contrast  Result Date: 07/19/2021 CLINICAL DATA:  Initial evaluation for neuro deficit, stroke suspected. EXAM: MRI HEAD WITHOUT CONTRAST MRA HEAD WITHOUT CONTRAST MRA NECK WITHOUT AND WITH CONTRAST TECHNIQUE: Multiplanar, multi-echo pulse sequences of the brain and surrounding structures were acquired without intravenous contrast. Angiographic images of the Circle of Willis were acquired using MRA technique without intravenous contrast. Angiographic images  of the neck were acquired using MRA technique without and with intravenous contrast. Carotid stenosis measurements (when applicable) are obtained utilizing NASCET criteria, using the distal internal carotid diameter as the denominator. CONTRAST:  See injection documentation. COMPARISON:  Prior CT from earlier the same day as well as recent MRI from 07/11/2021. FINDINGS: MRI HEAD FINDINGS Brain: Cerebral volume within normal limits. Patchy T2/FLAIR hyperintensity involving the periventricular deep white matter both cerebral hemispheres most consistent with chronic small vessel ischemic disease, moderate in nature. Small remote left pontine lacunar infarct again noted. No abnormal foci of restricted diffusion to suggest acute or subacute ischemia. Gray-white matter differentiation maintained. No areas of chronic cortical infarction. No acute or chronic intracranial blood products. No mass lesion or midline shift. No hydrocephalus or extra-axial fluid collection. Partially empty sella noted. Midline structures intact. Vascular: Major intracranial vascular flow voids are maintained. Skull and upper cervical spine: Craniocervical junction within normal limits. Bone marrow signal intensity diffusely decreased on T1 weighted sequence, nonspecific, but most  commonly related to anemia, smoking or obesity. No focal marrow replacing lesion. No scalp soft tissue abnormality. Sinuses/Orbits: Bilateral axial myopia noted. Globes and orbital soft tissues demonstrate no acute finding. Scattered mucosal thickening noted about the ethmoidal air cells. Few small maxillary sinus retention cyst noted. No mastoid effusion. Other: None. MRA HEAD FINDINGS Anterior circulation: Visualized distal cervical segments of the internal carotid arteries widely patent with antegrade flow. Petrous, cavernous, and supraclinoid segments patent without stenosis or other abnormality. A1 segments patent. Normal anterior communicating complex. Anterior cerebral arteries patent without stenosis. No M1 stenosis or occlusion. Normal MCA bifurcations. Distal MCA branches well perfused and symmetric. Posterior circulation: Vertebral arteries largely code dominant and patent to the vertebrobasilar junction without stenosis. Both PICA origins patent and normal. Basilar patent to its distal aspect without stenosis. Superior cerebellar arteries patent bilaterally. Both PCAs primarily supplied via the basilar. Prominent left posterior communicating artery noted. Both PCAs well perfused to their distal aspects without stenosis. Anatomic variants: None significant. No aneurysm. MRA NECK FINDINGS Aortic arch: Visualized aortic arch normal in caliber. Bovine branching pattern noted. No stenosis about the origin of the great vessels. Right carotid system: Right common and internal carotid arteries patent without stenosis or evidence for dissection. No significant atheromatous narrowing about the right carotid bulb. Left carotid system: Left common and internal carotid arteries patent without stenosis or evidence for dissection. No significant atheromatous narrowing about the left carotid bulb. Vertebral arteries: Both vertebral arteries arise from subclavian arteries. No significant proximal subclavian artery  stenosis. Vertebral arteries patent without stenosis or evidence for dissection. Other: None IMPRESSION: MRI HEAD: 1. No acute intracranial abnormality. 2. Moderate chronic microvascular ischemic disease, with small remote left pontine lacunar infarct. 3. Partially empty sella. MRA HEAD: Normal intracranial MRA. No large vessel occlusion, hemodynamically significant stenosis, or other acute vascular abnormality. MRA NECK: Normal MRA of the neck. Electronically Signed   By: Jeannine Boga M.D.   On: 07/19/2021 20:01   MR BRAIN WO CONTRAST  Result Date: 07/19/2021 CLINICAL DATA:  Initial evaluation for neuro deficit, stroke suspected. EXAM: MRI HEAD WITHOUT CONTRAST MRA HEAD WITHOUT CONTRAST MRA NECK WITHOUT AND WITH CONTRAST TECHNIQUE: Multiplanar, multi-echo pulse sequences of the brain and surrounding structures were acquired without intravenous contrast. Angiographic images of the Circle of Willis were acquired using MRA technique without intravenous contrast. Angiographic images of the neck were acquired using MRA technique without and with intravenous contrast. Carotid stenosis measurements (when applicable) are obtained utilizing  NASCET criteria, using the distal internal carotid diameter as the denominator. CONTRAST:  See injection documentation. COMPARISON:  Prior CT from earlier the same day as well as recent MRI from 07/11/2021. FINDINGS: MRI HEAD FINDINGS Brain: Cerebral volume within normal limits. Patchy T2/FLAIR hyperintensity involving the periventricular deep white matter both cerebral hemispheres most consistent with chronic small vessel ischemic disease, moderate in nature. Small remote left pontine lacunar infarct again noted. No abnormal foci of restricted diffusion to suggest acute or subacute ischemia. Gray-white matter differentiation maintained. No areas of chronic cortical infarction. No acute or chronic intracranial blood products. No mass lesion or midline shift. No hydrocephalus  or extra-axial fluid collection. Partially empty sella noted. Midline structures intact. Vascular: Major intracranial vascular flow voids are maintained. Skull and upper cervical spine: Craniocervical junction within normal limits. Bone marrow signal intensity diffusely decreased on T1 weighted sequence, nonspecific, but most commonly related to anemia, smoking or obesity. No focal marrow replacing lesion. No scalp soft tissue abnormality. Sinuses/Orbits: Bilateral axial myopia noted. Globes and orbital soft tissues demonstrate no acute finding. Scattered mucosal thickening noted about the ethmoidal air cells. Few small maxillary sinus retention cyst noted. No mastoid effusion. Other: None. MRA HEAD FINDINGS Anterior circulation: Visualized distal cervical segments of the internal carotid arteries widely patent with antegrade flow. Petrous, cavernous, and supraclinoid segments patent without stenosis or other abnormality. A1 segments patent. Normal anterior communicating complex. Anterior cerebral arteries patent without stenosis. No M1 stenosis or occlusion. Normal MCA bifurcations. Distal MCA branches well perfused and symmetric. Posterior circulation: Vertebral arteries largely code dominant and patent to the vertebrobasilar junction without stenosis. Both PICA origins patent and normal. Basilar patent to its distal aspect without stenosis. Superior cerebellar arteries patent bilaterally. Both PCAs primarily supplied via the basilar. Prominent left posterior communicating artery noted. Both PCAs well perfused to their distal aspects without stenosis. Anatomic variants: None significant. No aneurysm. MRA NECK FINDINGS Aortic arch: Visualized aortic arch normal in caliber. Bovine branching pattern noted. No stenosis about the origin of the great vessels. Right carotid system: Right common and internal carotid arteries patent without stenosis or evidence for dissection. No significant atheromatous narrowing about  the right carotid bulb. Left carotid system: Left common and internal carotid arteries patent without stenosis or evidence for dissection. No significant atheromatous narrowing about the left carotid bulb. Vertebral arteries: Both vertebral arteries arise from subclavian arteries. No significant proximal subclavian artery stenosis. Vertebral arteries patent without stenosis or evidence for dissection. Other: None IMPRESSION: MRI HEAD: 1. No acute intracranial abnormality. 2. Moderate chronic microvascular ischemic disease, with small remote left pontine lacunar infarct. 3. Partially empty sella. MRA HEAD: Normal intracranial MRA. No large vessel occlusion, hemodynamically significant stenosis, or other acute vascular abnormality. MRA NECK: Normal MRA of the neck. Electronically Signed   By: Jeannine Boga M.D.   On: 07/19/2021 20:01   MR BRAIN WO CONTRAST  Result Date: 07/11/2021 CLINICAL DATA:  Neuro deficit, acute, stroke suspected EXAM: MRI HEAD WITHOUT CONTRAST TECHNIQUE: Multiplanar, multiecho pulse sequences of the brain and surrounding structures were obtained without intravenous contrast. COMPARISON:  None. FINDINGS: Brain: There is no acute infarction or intracranial hemorrhage. There is no intracranial mass, mass effect, or edema. There is no hydrocephalus or extra-axial fluid collection. Prominence of the ventricles and sulci reflects minor parenchymal volume loss. Patchy T2 hyperintensity in the supratentorial and pontine white matter is nonspecific but may reflect mild chronic microvascular ischemic changes. 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 Vascular: Major  vessel flow voids at the skull base are preserved. Skull and upper cervical spine: Normal marrow signal is preserved. Sinuses/Orbits: Mild mucosal thickening.  Orbits are unremarkable. Other: Sella is partially empty.  Mastoid air cells are clear. IMPRESSION: No acute infarction, hemorrhage, or mass. Chronic microvascular  ischemic changes. 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 Electronically Signed   By: Macy Mis M.D.   On: 07/11/2021 15:37   US RENAL  Result Date: 07/19/2021 CLINICAL DATA:  Acute kidney injury. EXAM: RENAL / URINARY TRACT ULTRASOUND COMPLETE COMPARISON:  August 11, 2015 FINDINGS: Right Kidney: Renal measurements: 10.4 cm x 6.2 cm x 5.8 cm = volume: 194.5 mL. Echogenicity within normal limits. No mass or hydronephrosis visualized. Left Kidney: Renal measurements: 10.8 cm x 6.9 cm x 7.7 cm = volume: 300.3 mL. Echogenicity within normal limits. No mass or hydronephrosis visualized. Bladder: Appears normal for degree of bladder distention. Other: None. IMPRESSION: Normal renal ultrasound. Electronically Signed   By: Virgina Norfolk M.D.   On: 07/19/2021 22:27   DG Chest Portable 1 View  Result Date: 07/19/2021 CLINICAL DATA:  Decreased appetite. EXAM: ABDOMEN - 1 VIEW; PORTABLE CHEST - 1 VIEW COMPARISON:  06/05/2021, 01/16/2021. FINDINGS: The heart and mediastinal contour are within normal limits. Lung volumes are low. No consolidation, effusion, or pneumothorax. No acute osseous abnormality. The bowel gas pattern is normal. Suture material is noted in the right lower quadrant. No radio-opaque calculi or other significant radiographic abnormality are seen. IMPRESSION: Negative. Electronically Signed   By: Brett Fairy M.D.   On: 07/19/2021 21:16     Time coordinating discharge: Over 30 minutes    Dwyane Dee, MD  [facility] 07/21/2021, 2:47 PM

## 2023-08-22 ENCOUNTER — Emergency Department (HOSPITAL_COMMUNITY)
Admission: EM | Admit: 2023-08-22 | Discharge: 2023-08-22 | Disposition: A | Discharge status: Home / Self Care | Payer: No Typology Code available for payment source | Attending: Emergency Medicine | Admitting: Emergency Medicine

## 2023-08-22 ENCOUNTER — Encounter (HOSPITAL_COMMUNITY): Payer: Self-pay | Admitting: Emergency Medicine

## 2023-08-22 ENCOUNTER — Emergency Department (HOSPITAL_COMMUNITY): Payer: Medicare PPO

## 2023-08-22 DIAGNOSIS — Z794 Long term (current) use of insulin: Secondary | ICD-10-CM | POA: Insufficient documentation

## 2023-08-22 DIAGNOSIS — E1165 Type 2 diabetes mellitus with hyperglycemia: Secondary | ICD-10-CM | POA: Diagnosis not present

## 2023-08-22 DIAGNOSIS — Z7984 Long term (current) use of oral hypoglycemic drugs: Secondary | ICD-10-CM | POA: Insufficient documentation

## 2023-08-22 DIAGNOSIS — R739 Hyperglycemia, unspecified: Secondary | ICD-10-CM | POA: Diagnosis present

## 2023-08-22 DIAGNOSIS — R0789 Other chest pain: Secondary | ICD-10-CM

## 2023-08-22 DIAGNOSIS — Z7982 Long term (current) use of aspirin: Secondary | ICD-10-CM | POA: Insufficient documentation

## 2023-08-22 LAB — COMPREHENSIVE METABOLIC PANEL
ALT: 21 U/L (ref 0–44)
AST: 15 U/L (ref 15–41)
Albumin: 3.8 g/dL (ref 3.5–5.0)
Alkaline Phosphatase: 116 U/L (ref 38–126)
Anion gap: 10 (ref 5–15)
BUN: 29 mg/dL — ABNORMAL HIGH (ref 8–23)
CO2: 19 mmol/L — ABNORMAL LOW (ref 22–32)
Calcium: 9.5 mg/dL (ref 8.9–10.3)
Chloride: 101 mmol/L (ref 98–111)
Creatinine, Ser: 1.36 mg/dL — ABNORMAL HIGH (ref 0.61–1.24)
GFR, Estimated: 57 mL/min — ABNORMAL LOW (ref >60)
Glucose, Bld: 704 mg/dL (ref 70–99)
Potassium: 4.2 mmol/L (ref 3.5–5.1)
Sodium: 130 mmol/L — ABNORMAL LOW (ref 135–145)
Total Bilirubin: 0.6 mg/dL (ref 0.0–1.2)
Total Protein: 7.9 g/dL (ref 6.5–8.1)

## 2023-08-22 LAB — CBC WITH DIFFERENTIAL/PLATELET
Abs Immature Granulocytes: 0.02 10*3/uL (ref 0.00–0.07)
Basophils Absolute: 0 10*3/uL (ref 0.0–0.1)
Basophils Relative: 1 %
Eosinophils Absolute: 0.1 10*3/uL (ref 0.0–0.5)
Eosinophils Relative: 1 %
HCT: 47.7 % (ref 39.0–52.0)
Hemoglobin: 14.8 g/dL (ref 13.0–17.0)
Immature Granulocytes: 0 %
Lymphocytes Relative: 35 %
Lymphs Abs: 1.8 10*3/uL (ref 0.7–4.0)
MCH: 25.7 pg — ABNORMAL LOW (ref 26.0–34.0)
MCHC: 31 g/dL (ref 30.0–36.0)
MCV: 83 fL (ref 80.0–100.0)
Monocytes Absolute: 0.3 10*3/uL (ref 0.1–1.0)
Monocytes Relative: 6 %
Neutro Abs: 3.1 10*3/uL (ref 1.7–7.7)
Neutrophils Relative %: 57 %
Platelets: 209 10*3/uL (ref 150–400)
RBC: 5.75 MIL/uL (ref 4.22–5.81)
RDW: 14.7 % (ref 11.5–15.5)
WBC: 5.3 10*3/uL (ref 4.0–10.5)
nRBC: 0 % (ref 0.0–0.2)

## 2023-08-22 LAB — CBG MONITORING, ED
Glucose-Capillary: >600 mg/dL (ref 70–99)
Glucose-Capillary: 501 mg/dL (ref 70–99)
Glucose-Capillary: 316 mg/dL — ABNORMAL HIGH (ref 70–99)

## 2023-08-22 LAB — I-STAT CG4 LACTIC ACID, ED: Lactic Acid, Venous: 1.1 mmol/L (ref 0.5–1.9)

## 2023-08-22 LAB — I-STAT CHEM 8, ED
BUN: 38 mg/dL — ABNORMAL HIGH (ref 8–23)
Calcium, Ion: 1.13 mmol/L — ABNORMAL LOW (ref 1.15–1.40)
Chloride: 102 mmol/L (ref 98–111)
Creatinine, Ser: 1.3 mg/dL — ABNORMAL HIGH (ref 0.61–1.24)
Glucose, Bld: 645 mg/dL (ref 70–99)
HCT: 49 % (ref 39.0–52.0)
Hemoglobin: 16.7 g/dL (ref 13.0–17.0)
Potassium: 5.3 mmol/L — ABNORMAL HIGH (ref 3.5–5.1)
Sodium: 133 mmol/L — ABNORMAL LOW (ref 135–145)
TCO2: 24 mmol/L (ref 22–32)

## 2023-08-22 LAB — URINALYSIS, ROUTINE W REFLEX MICROSCOPIC
Bacteria, UA: NONE SEEN
Bilirubin Urine: NEGATIVE
Glucose, UA: >=500 mg/dL — AB
Hgb urine dipstick: NEGATIVE
Ketones, ur: NEGATIVE mg/dL
Leukocytes,Ua: NEGATIVE
Nitrite: NEGATIVE
Protein, ur: NEGATIVE mg/dL
Specific Gravity, Urine: 1.026 (ref 1.005–1.030)
pH: 6 (ref 5.0–8.0)

## 2023-08-22 LAB — TROPONIN I (HIGH SENSITIVITY)
Troponin I (High Sensitivity): 16 ng/L (ref <18)
Troponin I (High Sensitivity): 18 ng/L — ABNORMAL HIGH (ref <18)

## 2023-08-22 MED ORDER — LACTATED RINGERS IV BOLUS
1000.0000 mL | Freq: Once | INTRAVENOUS | Status: AC
  Administered 2023-08-22: 1000 mL via INTRAVENOUS

## 2023-08-22 MED ORDER — LACTATED RINGERS IV BOLUS
2000.0000 mL | Freq: Once | INTRAVENOUS | Status: AC
  Administered 2023-08-22: 2000 mL via INTRAVENOUS

## 2023-08-22 MED ORDER — LACTATED RINGERS IV BOLUS
20.0000 mL/kg | Freq: Once | INTRAVENOUS | Status: DC
Start: 2023-08-22 — End: 2023-08-22

## 2023-08-22 MED ORDER — SODIUM ZIRCONIUM CYCLOSILICATE 5 G PO PACK
5.0000 g | PACK | Freq: Once | ORAL | Status: DC
Start: 2023-08-22 — End: 2023-08-22

## 2023-08-22 MED ORDER — INSULIN ASPART 100 UNIT/ML IJ SOLN
10.0000 [IU] | Freq: Once | INTRAMUSCULAR | Status: AC
  Administered 2023-08-22: 10 [IU] via SUBCUTANEOUS
  Filled 2023-08-22: qty 0.1

## 2023-08-22 NOTE — ED Provider Notes (Signed)
 I provided a substantive portion of the care of this patient.  I personally made/approved the management plan for this patient and take responsibility for the patient management.  EKG Interpretation Date/Time:  Thursday August 22 2023 12:40:31 EST Ventricular Rate:  99 PR Interval:  204 QRS Duration:  80 QT Interval:  355 QTC Calculation: 456 R Axis:   30  Text Interpretation: Sinus rhythm Anterior infarct, old Confirmed by Lorre Nick ([id number]) on 08/22/2023 2:35:05 PM   Patient is EKG per interpretation shows sinus rhythm.  Patient here complaint of hyperglycemia.  Has not been compliant with a diabetic diet.  Labs showed no evidence of DKA.  Will give IV hydration here and insulin and likely discharge   Lorre Nick, MD 08/22/23 1536

## 2023-08-22 NOTE — ED Triage Notes (Signed)
 [facility] , pt only complaint is some slight cp but thinks its from lifting

## 2023-08-22 NOTE — Discharge Instructions (Addendum)
 Follow-up closely with your primary care doctor.  You may need additional insulin coverage.  Cut out the sugary drinks including honey.  Return for any concerning symptoms.  Monitor your blood glucose closely and check it regularly.  I have given you cardiology referral for your chest pain.  Heart enzymes were reassuring.

## 2023-08-22 NOTE — ED Notes (Signed)
ED PA at [facility] 

## 2023-08-22 NOTE — ED Notes (Signed)
 Pt alert, NAD, calm, interactive, resps e/u, speaking intelligibley at baseline. Denies pain, sob, nausea. IVF infusing. Xray at [facility].

## 2023-08-22 NOTE — ED Notes (Signed)
 EDP at [facility]

## 2023-08-22 NOTE — ED Provider Notes (Signed)
 [place] EMERGENCY DEPARTMENT AT [facility] Provider Note   CSN: [id number] Arrival date & time: 08/22/23  1226     History  Chief Complaint  Patient presents with   Hyperglycemia    Micheal Gomez is a 68 y.o. male.  68 year old male was called by [facility] for hypoglycemia.  He states his blood work showed blood glucose of about 1500.  He states he is on 10 units of insulin daily that he takes every morning.  He did not take it this morning because he was in a rush to get to his appointment.  States he had slight chest pain this morning which resolved with aspirin otherwise he denies any shortness of breath, nausea, vomiting, abdominal pain or other complaints.  He is also on metformin, and Ozempic which he takes every Saturday.  The history is provided by the patient. No language interpreter was used.       Home Medications Prior to Admission medications   Medication Sig Start Date End Date Taking? Authorizing Provider  amLODipine (NORVASC) 10 MG tablet Take 1 tablet (10 mg total) by mouth daily. 06/05/21 08/19/21  Linwood Dibbles, MD  aspirin EC 81 MG tablet Take 81 mg by mouth daily. Swallow whole.    [provider]  atorvastatin (LIPITOR) 20 MG tablet Take 20 mg by mouth daily.    [provider]  blood glucose meter kit and supplies Dispense based on patient and insurance preference. Use up to four times daily as directed. (FOR ICD-9 250.00, 250.01). 08/21/15   Penny Pia, MD  doxepin (SINEQUAN) 50 MG capsule Take 50 mg by mouth at bedtime.    [provider]  glucose blood (FREESTYLE LITE) test strip Use as instructed 08/15/15   Dhungel, Nishant, MD  insulin aspart (NOVOLOG) 100 UNIT/ML injection Inject 30 Units into the skin 2 (two) times daily before a meal.    [provider]  insulin starter kit- pen needles MISC 1 kit by Other route once. 08/21/15   Penny Pia, MD  INSULIN SYRINGE .5CC/28G 28G X 1/2" 0.5 ML MISC 1 application  by Does not apply route daily. 08/15/15   Dhungel, Theda Belfast, MD  metFORMIN (GLUCOPHAGE-XR) 500 MG 24 hr tablet Take 1,000 mg by mouth 2 (two) times daily.    [provider]  omeprazole (PRILOSEC) 20 MG capsule Take 20 mg by mouth daily.    [provider]  OVER THE COUNTER MEDICATION Take 2 capsules by mouth See admin instructions. Instaflex capsules: Take 2 capsules by mouth every 3 months    [provider]  Semaglutide, 1 MG/DOSE, (OZEMPIC, 1 MG/DOSE,) 2 MG/1.5ML SOPN Inject 1 mg into the skin every Saturday.    [provider]      Allergies    Patient has no known allergies.    Review of Systems   Review of Systems  Constitutional:  Negative for chills and fever.  Respiratory:  Negative for shortness of breath.   Cardiovascular:  Positive for chest pain.  Gastrointestinal:  Negative for abdominal pain.  Neurological:  Negative for light-headedness.  All other systems reviewed and are negative.   Physical Exam Updated Vital Signs BP (!) 171/96 (BP Location: Left Arm)   Pulse (!) 104   Temp 98 F (36.7 C) (Oral)   Resp 18   SpO2 97%  Physical Exam Vitals and nursing note reviewed.  Constitutional:      General: He is not in acute distress.    Appearance: Normal  appearance. He is not ill-appearing.  HENT:     Head: Normocephalic and atraumatic.     Nose: Nose normal.  Eyes:     Conjunctiva/sclera: Conjunctivae normal.  Cardiovascular:     Rate and Rhythm: Regular rhythm. Tachycardia present.  Pulmonary:     Effort: Pulmonary effort is normal. No respiratory distress.  Abdominal:     General: There is no distension.     Palpations: Abdomen is soft.     Tenderness: There is no abdominal tenderness. There is no guarding.  Musculoskeletal:        General: No deformity. Normal range of motion.     Cervical back: Normal range of motion.  Skin:    Findings: No rash.  Neurological:     Mental Status: He is alert.     ED Results /  Procedures / Treatments   Labs (all labs ordered are listed, but only abnormal results are displayed) Labs Reviewed  CBC WITH DIFFERENTIAL/PLATELET - Abnormal; Notable for the following components:      Result Value   MCH 25.7 (*)    All other components within normal limits  CBG MONITORING, ED - Abnormal; Notable for the following components:   Glucose-Capillary >600 (*)    All other components within normal limits  COMPREHENSIVE METABOLIC PANEL  BETA-HYDROXYBUTYRIC ACID  BETA-HYDROXYBUTYRIC ACID  BETA-HYDROXYBUTYRIC ACID  BETA-HYDROXYBUTYRIC ACID  URINALYSIS, ROUTINE W REFLEX MICROSCOPIC  BLOOD GAS, VENOUS  CBG MONITORING, ED  I-STAT CG4 LACTIC ACID, ED  I-STAT CHEM 8, ED    EKG None  Radiology No results found.  Procedures Procedures    Medications Ordered in ED Medications  lactated ringers bolus 2,000 mL (has no administration in time range)    ED Course/ Medical Decision Making/ A&P                                 Medical Decision Making Amount and/or Complexity of Data Reviewed Labs: ordered. Radiology: ordered.  Risk Prescription drug management.   Medical Decision Making / ED Course   This patient presents to the ED for concern of hyperglycemia, this involves an extensive number of treatment options, and is a complaint that carries with it a high risk of complications and morbidity.  The differential diagnosis includes DKA, HHS, hyperglycemia, ACS, MSK pain, GERD  MDM: 68 year old male with past medical history of type 2 diabetes presents today for concern of hyperglycemia.  He states he was called and told to come to the emergency department for blood sugar of about 1500.  CBG in the emergency department reads greater than 600.  Blood work ordered to evaluate for DKA or other acute process.  ACS workup also ordered.  Admission considered but will reevaluate after labs and imaging.  Troponin negative initially 16, repeat 18.  Relatively flat.   EKG without acute ischemic change.  Low suspicion for ACS.  Currently symptom-free.  Fevers CBC within leukocytosis or anemia.  CMP shows a glucose of 704, sodium of 130 which is likely pseudohyponatremia, creatinine of 1.36.  Without lactic acidosis, UA without evidence of UTI.  Without anion gap.  Chest x-ray without acute cardiopulmonary process.  Total of 3 2 L fluid bolus were given.  10 units of NovoLog.  On reevaluation CBG improved to 316.  Throughout the emergency room stay he has been without any symptoms.  No other concerns.  Since he is not in DKA or other acute  process he is stable for discharge to closely follow-up with his PCP.  Advised him to cut out the sugary drinks and any.  Given the elevated troponin, and complaint of atypical chest pain we will give referral to cardiology for further outpatient evaluation.  Lab Tests: -I ordered, reviewed, and interpreted labs.   The pertinent results include:   Labs Reviewed  CBC WITH DIFFERENTIAL/PLATELET - Abnormal; Notable for the following components:      Result Value   MCH 25.7 (*)    All other components within normal limits  CBG MONITORING, ED - Abnormal; Notable for the following components:   Glucose-Capillary >600 (*)    All other components within normal limits  COMPREHENSIVE METABOLIC PANEL  BETA-HYDROXYBUTYRIC ACID  BETA-HYDROXYBUTYRIC ACID  BETA-HYDROXYBUTYRIC ACID  BETA-HYDROXYBUTYRIC ACID  URINALYSIS, ROUTINE W REFLEX MICROSCOPIC  BLOOD GAS, VENOUS  CBG MONITORING, ED  I-STAT CG4 LACTIC ACID, ED  I-STAT CHEM 8, ED  TROPONIN I (HIGH SENSITIVITY)      EKG  EKG Interpretation Date/Time:    Ventricular Rate:    PR Interval:    QRS Duration:    QT Interval:    QTC Calculation:   R Axis:      Text Interpretation:           Imaging Studies ordered: I ordered imaging studies including cxr I independently visualized and interpreted imaging. I agree with the radiologist interpretation   Medicines  ordered and prescription drug management: Meds ordered this encounter  Medications   DISCONTD: lactated ringers bolus 20 mL/kg   lactated ringers bolus 2,000 mL    -I have reviewed the patients home medicines and have made adjustments as needed  Reevaluation: After the interventions noted above, I reevaluated the patient and found that they have :improved  Co morbidities that complicate the patient evaluation  Past Medical History:  Diagnosis Date   Acute encephalopathy    Acute renal failure (HCC)    COVID-19    11-29-20   Diabetes mellitus without complication (HCC)    Diabetic ketoacidosis with coma associated with type 2 diabetes mellitus (HCC)    DKA (diabetic ketoacidoses) 08/05/2015   High cholesterol    History of stomach ulcers    Hypertension       Dispostion: Discharged in stable condition.  Return precaution discussed.  Patient voices understanding and is in agreement with plan.   Final Clinical Impression(s) / ED Diagnoses Final diagnoses:  Hyperglycemia    Rx / DC Orders ED Discharge Orders          Ordered    Ambulatory referral to Cardiology       Comments: If you have not heard from the Cardiology office within the next 72 hours please call [phone].   08/22/23 1818              Marita [place], PA-C 08/22/23 1818    Lorre Nick, MD 08/23/23 Rickey Primus

## 2024-07-01 ENCOUNTER — Emergency Department (HOSPITAL_COMMUNITY)
Admission: EM | Admit: 2024-07-01 | Discharge: 2024-07-01 | Discharge status: Against Medical Advice | Attending: Emergency Medicine | Admitting: Emergency Medicine

## 2024-07-01 ENCOUNTER — Other Ambulatory Visit: Payer: Self-pay

## 2024-07-01 DIAGNOSIS — Z5321 Procedure and treatment not carried out due to patient leaving prior to being seen by health care provider: Secondary | ICD-10-CM | POA: Insufficient documentation

## 2024-07-01 DIAGNOSIS — R1030 Lower abdominal pain, unspecified: Secondary | ICD-10-CM | POA: Diagnosis present

## 2024-07-01 LAB — URINALYSIS, ROUTINE W REFLEX MICROSCOPIC
Bilirubin Urine: NEGATIVE
Glucose, UA: >=500 mg/dL — AB
Hgb urine dipstick: NEGATIVE
Ketones, ur: NEGATIVE mg/dL
Leukocytes,Ua: NEGATIVE
Nitrite: NEGATIVE
Protein, ur: 30 mg/dL — AB
Specific Gravity, Urine: 1.021 (ref 1.005–1.030)
pH: 6 (ref 5.0–8.0)

## 2024-07-01 LAB — COMPREHENSIVE METABOLIC PANEL WITH GFR
ALT: 15 U/L (ref 0–44)
AST: 18 U/L (ref 15–41)
Albumin: 4 g/dL (ref 3.5–5.0)
Alkaline Phosphatase: 119 U/L (ref 38–126)
Anion gap: 13 (ref 5–15)
BUN: 23 mg/dL (ref 8–23)
CO2: 21 mmol/L — ABNORMAL LOW (ref 22–32)
Calcium: 9.6 mg/dL (ref 8.9–10.3)
Chloride: 100 mmol/L (ref 98–111)
Creatinine, Ser: 1.17 mg/dL (ref 0.61–1.24)
GFR, Estimated: >60 mL/min
Glucose, Bld: 146 mg/dL — ABNORMAL HIGH (ref 70–99)
Potassium: 3.4 mmol/L — ABNORMAL LOW (ref 3.5–5.1)
Sodium: 135 mmol/L (ref 135–145)
Total Bilirubin: 0.4 mg/dL (ref 0.0–1.2)
Total Protein: 7.7 g/dL (ref 6.5–8.1)

## 2024-07-01 LAB — CBC
HCT: 45.2 % (ref 39.0–52.0)
Hemoglobin: 14.4 g/dL (ref 13.0–17.0)
MCH: 25.7 pg — ABNORMAL LOW (ref 26.0–34.0)
MCHC: 31.9 g/dL (ref 30.0–36.0)
MCV: 80.6 fL (ref 80.0–100.0)
Platelets: 213 K/uL (ref 150–400)
RBC: 5.61 MIL/uL (ref 4.22–5.81)
RDW: 14.8 % (ref 11.5–15.5)
WBC: 6.1 K/uL (ref 4.0–10.5)
nRBC: 0 % (ref 0.0–0.2)

## 2024-07-01 LAB — LIPASE, BLOOD: Lipase: 38 U/L (ref 11–51)

## 2024-07-01 NOTE — ED Triage Notes (Signed)
 Pt is here with lower abdominal pain for months. States normal bowel movements, no bleeding, and passes gas. Denies any urinary symptoms or fevers. Pt states he has had his appendix out in the past.

## 2024-07-01 NOTE — ED Notes (Signed)
Called for room but no answer.
